# Patient Record
Sex: Female | Born: 1945 | Race: White | Hispanic: No | Marital: Single | Smoking: Former smoker
Health system: Southern US, Community
[De-identification: ages and names within clinical notes are randomized; demographics above are authoritative.]

## PROBLEM LIST (undated history)

## (undated) DIAGNOSIS — I10 Essential (primary) hypertension: Secondary | ICD-10-CM

## (undated) DIAGNOSIS — C801 Malignant (primary) neoplasm, unspecified: Secondary | ICD-10-CM

## (undated) DIAGNOSIS — R918 Other nonspecific abnormal finding of lung field: Secondary | ICD-10-CM

## (undated) DIAGNOSIS — J439 Emphysema, unspecified: Secondary | ICD-10-CM

## (undated) DIAGNOSIS — E7800 Pure hypercholesterolemia, unspecified: Secondary | ICD-10-CM

## (undated) DIAGNOSIS — N289 Disorder of kidney and ureter, unspecified: Secondary | ICD-10-CM

## (undated) HISTORY — PX: COLONOSCOPY: SHX174

## (undated) HISTORY — PX: BASAL CELL CARCINOMA EXCISION: SHX1214

## (undated) HISTORY — DX: Emphysema, unspecified: J43.9

---

## 2004-05-10 ENCOUNTER — Emergency Department: Payer: Self-pay | Admitting: Unknown Physician Specialty

## 2004-05-12 ENCOUNTER — Emergency Department: Payer: Self-pay | Admitting: Emergency Medicine

## 2006-09-11 ENCOUNTER — Ambulatory Visit: Payer: Self-pay | Admitting: Gastroenterology

## 2008-12-15 ENCOUNTER — Ambulatory Visit: Payer: Self-pay | Admitting: Nephrology

## 2011-06-14 DIAGNOSIS — E872 Acidosis: Secondary | ICD-10-CM | POA: Diagnosis not present

## 2011-06-14 DIAGNOSIS — I10 Essential (primary) hypertension: Secondary | ICD-10-CM | POA: Diagnosis not present

## 2011-10-11 DIAGNOSIS — E872 Acidosis: Secondary | ICD-10-CM | POA: Diagnosis not present

## 2011-10-11 DIAGNOSIS — I10 Essential (primary) hypertension: Secondary | ICD-10-CM | POA: Diagnosis not present

## 2011-10-11 DIAGNOSIS — R609 Edema, unspecified: Secondary | ICD-10-CM | POA: Diagnosis not present

## 2012-02-01 DIAGNOSIS — E872 Acidosis: Secondary | ICD-10-CM | POA: Diagnosis not present

## 2012-02-01 DIAGNOSIS — R609 Edema, unspecified: Secondary | ICD-10-CM | POA: Diagnosis not present

## 2012-02-01 DIAGNOSIS — I10 Essential (primary) hypertension: Secondary | ICD-10-CM | POA: Diagnosis not present

## 2012-05-23 DIAGNOSIS — R609 Edema, unspecified: Secondary | ICD-10-CM | POA: Diagnosis not present

## 2012-05-23 DIAGNOSIS — I10 Essential (primary) hypertension: Secondary | ICD-10-CM | POA: Diagnosis not present

## 2012-05-23 DIAGNOSIS — E872 Acidosis: Secondary | ICD-10-CM | POA: Diagnosis not present

## 2012-09-26 DIAGNOSIS — R609 Edema, unspecified: Secondary | ICD-10-CM | POA: Diagnosis not present

## 2012-09-26 DIAGNOSIS — E872 Acidosis: Secondary | ICD-10-CM | POA: Diagnosis not present

## 2012-09-26 DIAGNOSIS — I10 Essential (primary) hypertension: Secondary | ICD-10-CM | POA: Diagnosis not present

## 2013-01-05 ENCOUNTER — Ambulatory Visit: Payer: Self-pay | Admitting: Physician Assistant

## 2013-01-08 ENCOUNTER — Ambulatory Visit: Payer: Self-pay | Admitting: Physician Assistant

## 2013-01-09 LAB — WOUND CULTURE

## 2013-01-14 ENCOUNTER — Ambulatory Visit: Payer: Self-pay | Admitting: Physician Assistant

## 2013-01-16 DIAGNOSIS — E872 Acidosis: Secondary | ICD-10-CM | POA: Diagnosis not present

## 2013-01-16 DIAGNOSIS — I10 Essential (primary) hypertension: Secondary | ICD-10-CM | POA: Diagnosis not present

## 2013-01-16 DIAGNOSIS — R609 Edema, unspecified: Secondary | ICD-10-CM | POA: Diagnosis not present

## 2013-01-30 DIAGNOSIS — R609 Edema, unspecified: Secondary | ICD-10-CM | POA: Diagnosis not present

## 2013-01-30 DIAGNOSIS — E872 Acidosis, unspecified: Secondary | ICD-10-CM | POA: Diagnosis not present

## 2013-01-30 DIAGNOSIS — N183 Chronic kidney disease, stage 3 unspecified: Secondary | ICD-10-CM | POA: Diagnosis not present

## 2013-01-30 DIAGNOSIS — I10 Essential (primary) hypertension: Secondary | ICD-10-CM | POA: Diagnosis not present

## 2013-05-21 DIAGNOSIS — E872 Acidosis, unspecified: Secondary | ICD-10-CM | POA: Diagnosis not present

## 2013-05-21 DIAGNOSIS — I10 Essential (primary) hypertension: Secondary | ICD-10-CM | POA: Diagnosis not present

## 2013-05-21 DIAGNOSIS — N183 Chronic kidney disease, stage 3 unspecified: Secondary | ICD-10-CM | POA: Diagnosis not present

## 2013-05-21 DIAGNOSIS — R609 Edema, unspecified: Secondary | ICD-10-CM | POA: Diagnosis not present

## 2013-09-10 DIAGNOSIS — E872 Acidosis, unspecified: Secondary | ICD-10-CM | POA: Diagnosis not present

## 2013-09-10 DIAGNOSIS — I10 Essential (primary) hypertension: Secondary | ICD-10-CM | POA: Diagnosis not present

## 2013-09-10 DIAGNOSIS — N183 Chronic kidney disease, stage 3 unspecified: Secondary | ICD-10-CM | POA: Diagnosis not present

## 2013-09-10 DIAGNOSIS — R609 Edema, unspecified: Secondary | ICD-10-CM | POA: Diagnosis not present

## 2014-01-07 DIAGNOSIS — E872 Acidosis: Secondary | ICD-10-CM | POA: Diagnosis not present

## 2014-01-07 DIAGNOSIS — R600 Localized edema: Secondary | ICD-10-CM | POA: Diagnosis not present

## 2014-01-07 DIAGNOSIS — I10 Essential (primary) hypertension: Secondary | ICD-10-CM | POA: Diagnosis not present

## 2014-01-07 DIAGNOSIS — N183 Chronic kidney disease, stage 3 (moderate): Secondary | ICD-10-CM | POA: Diagnosis not present

## 2014-05-03 DIAGNOSIS — I10 Essential (primary) hypertension: Secondary | ICD-10-CM | POA: Diagnosis not present

## 2014-05-03 DIAGNOSIS — N183 Chronic kidney disease, stage 3 (moderate): Secondary | ICD-10-CM | POA: Diagnosis not present

## 2014-05-03 DIAGNOSIS — R600 Localized edema: Secondary | ICD-10-CM | POA: Diagnosis not present

## 2014-05-03 DIAGNOSIS — E872 Acidosis: Secondary | ICD-10-CM | POA: Diagnosis not present

## 2014-08-25 DIAGNOSIS — N183 Chronic kidney disease, stage 3 (moderate): Secondary | ICD-10-CM | POA: Diagnosis not present

## 2014-08-25 DIAGNOSIS — E872 Acidosis: Secondary | ICD-10-CM | POA: Diagnosis not present

## 2014-08-25 DIAGNOSIS — I10 Essential (primary) hypertension: Secondary | ICD-10-CM | POA: Diagnosis not present

## 2014-08-25 DIAGNOSIS — R600 Localized edema: Secondary | ICD-10-CM | POA: Diagnosis not present

## 2014-12-09 DIAGNOSIS — R809 Proteinuria, unspecified: Secondary | ICD-10-CM | POA: Diagnosis not present

## 2014-12-09 DIAGNOSIS — N183 Chronic kidney disease, stage 3 (moderate): Secondary | ICD-10-CM | POA: Diagnosis not present

## 2014-12-09 DIAGNOSIS — I10 Essential (primary) hypertension: Secondary | ICD-10-CM | POA: Diagnosis not present

## 2014-12-09 DIAGNOSIS — R600 Localized edema: Secondary | ICD-10-CM | POA: Diagnosis not present

## 2014-12-09 DIAGNOSIS — E872 Acidosis: Secondary | ICD-10-CM | POA: Diagnosis not present

## 2015-04-11 DIAGNOSIS — E872 Acidosis: Secondary | ICD-10-CM | POA: Diagnosis not present

## 2015-04-11 DIAGNOSIS — R600 Localized edema: Secondary | ICD-10-CM | POA: Diagnosis not present

## 2015-04-11 DIAGNOSIS — N183 Chronic kidney disease, stage 3 (moderate): Secondary | ICD-10-CM | POA: Diagnosis not present

## 2015-04-11 DIAGNOSIS — R809 Proteinuria, unspecified: Secondary | ICD-10-CM | POA: Diagnosis not present

## 2015-04-11 DIAGNOSIS — I10 Essential (primary) hypertension: Secondary | ICD-10-CM | POA: Diagnosis not present

## 2015-08-08 DIAGNOSIS — R809 Proteinuria, unspecified: Secondary | ICD-10-CM | POA: Diagnosis not present

## 2015-08-08 DIAGNOSIS — I10 Essential (primary) hypertension: Secondary | ICD-10-CM | POA: Diagnosis not present

## 2015-08-08 DIAGNOSIS — R600 Localized edema: Secondary | ICD-10-CM | POA: Diagnosis not present

## 2015-08-08 DIAGNOSIS — N183 Chronic kidney disease, stage 3 (moderate): Secondary | ICD-10-CM | POA: Diagnosis not present

## 2015-08-13 ENCOUNTER — Ambulatory Visit
Admission: EM | Admit: 2015-08-13 | Discharge: 2015-08-13 | Disposition: A | Discharge status: Home / Self Care | Payer: BLUE CROSS/BLUE SHIELD | Attending: Internal Medicine | Admitting: Internal Medicine

## 2015-08-13 ENCOUNTER — Ambulatory Visit: Payer: BLUE CROSS/BLUE SHIELD

## 2015-08-13 ENCOUNTER — Encounter: Payer: Self-pay | Admitting: Gynecology

## 2015-08-13 DIAGNOSIS — Z79899 Other long term (current) drug therapy: Secondary | ICD-10-CM | POA: Insufficient documentation

## 2015-08-13 DIAGNOSIS — E860 Dehydration: Secondary | ICD-10-CM | POA: Insufficient documentation

## 2015-08-13 DIAGNOSIS — N289 Disorder of kidney and ureter, unspecified: Secondary | ICD-10-CM | POA: Diagnosis not present

## 2015-08-13 DIAGNOSIS — Z7982 Long term (current) use of aspirin: Secondary | ICD-10-CM | POA: Insufficient documentation

## 2015-08-13 DIAGNOSIS — R05 Cough: Secondary | ICD-10-CM | POA: Diagnosis not present

## 2015-08-13 DIAGNOSIS — F17210 Nicotine dependence, cigarettes, uncomplicated: Secondary | ICD-10-CM | POA: Insufficient documentation

## 2015-08-13 DIAGNOSIS — R52 Pain, unspecified: Secondary | ICD-10-CM | POA: Diagnosis not present

## 2015-08-13 DIAGNOSIS — J209 Acute bronchitis, unspecified: Secondary | ICD-10-CM | POA: Diagnosis not present

## 2015-08-13 DIAGNOSIS — Z880 Allergy status to penicillin: Secondary | ICD-10-CM | POA: Insufficient documentation

## 2015-08-13 HISTORY — DX: Disorder of kidney and ureter, unspecified: N28.9

## 2015-08-13 HISTORY — DX: Pure hypercholesterolemia, unspecified: E78.00

## 2015-08-13 HISTORY — DX: Essential (primary) hypertension: I10

## 2015-08-13 LAB — COMPREHENSIVE METABOLIC PANEL
ALK PHOS: 91 U/L (ref 38–126)
ALT: 14 U/L (ref 14–54)
ANION GAP: 8 (ref 5–15)
AST: 17 U/L (ref 15–41)
Albumin: 3.2 g/dL — ABNORMAL LOW (ref 3.5–5.0)
BILIRUBIN TOTAL: 1.6 mg/dL — AB (ref 0.3–1.2)
BUN: 29 mg/dL — ABNORMAL HIGH (ref 6–20)
CALCIUM: 8.8 mg/dL — AB (ref 8.9–10.3)
CO2: 23 mmol/L (ref 22–32)
Chloride: 101 mmol/L (ref 101–111)
Creatinine, Ser: 1.68 mg/dL — ABNORMAL HIGH (ref 0.44–1.00)
GFR, EST AFRICAN AMERICAN: 34 mL/min — AB (ref >60)
GFR, EST NON AFRICAN AMERICAN: 30 mL/min — AB (ref >60)
Glucose, Bld: 116 mg/dL — ABNORMAL HIGH (ref 65–99)
POTASSIUM: 3.7 mmol/L (ref 3.5–5.1)
Sodium: 132 mmol/L — ABNORMAL LOW (ref 135–145)
TOTAL PROTEIN: 7.5 g/dL (ref 6.5–8.1)

## 2015-08-13 LAB — CBC WITH DIFFERENTIAL/PLATELET
BASOS ABS: 0 10*3/uL (ref 0–0.1)
BASOS PCT: 0 %
Eosinophils Absolute: 0 10*3/uL (ref 0–0.7)
Eosinophils Relative: 0 %
HEMATOCRIT: 38.9 % (ref 35.0–47.0)
Hemoglobin: 13.1 g/dL (ref 12.0–16.0)
LYMPHS PCT: 3 %
Lymphs Abs: 0.5 10*3/uL — ABNORMAL LOW (ref 1.0–3.6)
MCH: 30.7 pg (ref 26.0–34.0)
MCHC: 33.6 g/dL (ref 32.0–36.0)
MCV: 91.2 fL (ref 80.0–100.0)
Monocytes Absolute: 1.1 10*3/uL — ABNORMAL HIGH (ref 0.2–0.9)
Monocytes Relative: 6 %
NEUTROS ABS: 15.9 10*3/uL — AB (ref 1.4–6.5)
Neutrophils Relative %: 91 %
PLATELETS: 381 10*3/uL (ref 150–440)
RBC: 4.26 MIL/uL (ref 3.80–5.20)
RDW: 13.6 % (ref 11.5–14.5)
WBC: 17.6 10*3/uL — AB (ref 3.6–11.0)

## 2015-08-13 LAB — URINALYSIS COMPLETE WITH MICROSCOPIC (ARMC ONLY)
GLUCOSE, UA: NEGATIVE mg/dL
LEUKOCYTES UA: NEGATIVE
NITRITE: POSITIVE — AB
Protein, ur: >300 mg/dL — AB
SPECIFIC GRAVITY, URINE: 1.025 (ref 1.005–1.030)
SQUAMOUS EPITHELIAL / LPF: NONE SEEN
pH: 5 (ref 5.0–8.0)

## 2015-08-13 MED ORDER — PREDNISONE 50 MG PO TABS: 50.0000 mg | ORAL_TABLET | Freq: Every day | ORAL | Status: AC

## 2015-08-13 MED ORDER — LEVOFLOXACIN 500 MG PO TABS
500.0000 mg | ORAL_TABLET | Freq: Once | ORAL | Status: AC
  Administered 2015-08-13: 500 mg via ORAL

## 2015-08-13 MED ORDER — LEVOFLOXACIN 250 MG PO TABS: 250.0000 mg | ORAL_TABLET | Freq: Every day | ORAL | Status: DC

## 2015-08-13 MED ORDER — IPRATROPIUM-ALBUTEROL 0.5-2.5 (3) MG/3ML IN SOLN
3.0000 mL | Freq: Once | RESPIRATORY_TRACT | Status: AC
  Administered 2015-08-13: 3 mL via RESPIRATORY_TRACT

## 2015-08-13 MED ORDER — SODIUM CHLORIDE 0.9 % IV BOLUS (SEPSIS)
1000.0000 mL | Freq: Once | INTRAVENOUS | Status: AC
  Administered 2015-08-13: 1000 mL via INTRAVENOUS

## 2015-08-13 NOTE — ED Notes (Signed)
Patient c/o coughing/ low energy / and body ache x4 days.

## 2015-08-13 NOTE — ED Provider Notes (Signed)
CSN: [id number]     Arrival date & time 08/13/15  1053 History   First MD Initiated Contact with Patient 08/13/15 1122     Chief Complaint  Patient presents with  . Generalized Body Aches  . Cough   HPI  Patient is a 70 year old lady who presents today with 3 days history of achiness, chills, tactile temperature. Malaise. Just has no energy. Has a little bit of dry cough, little bit of runny/congested nose.  Some nausea, no vomiting. No diarrhea. No abdominal pain. No urinary frequency, no dysuria. Not lightheaded. No rash.  Works in the yard on weekends, mostly weed eating and rides the mower.  Has not seen a tick this year, no known tick bite. Has a dog who is mostly indoors. Follows with Dr. Holley Raring for renal insufficiency, has been told that she is not likely to need dialysis.  Is on multiple blood pressure medications. No recent changes or additions to her medication regimen.  Past Medical History  Diagnosis Date  . Hypertension   . Hypercholesteremia   . Renal disorder    History reviewed. No pertinent past surgical history. No family history on file. Social History  Substance Use Topics  . Smoking status: Current Every Day Smoker -- 1.00 packs/day    Types: Cigarettes  . Smokeless tobacco: None  . Alcohol Use: Yes    Review of Systems  Allergies  Augmentin and Penicillins  Home Medications   Prior to Admission medications   Medication Sig Start Date End Date Taking? Authorizing Provider  amLODipine (NORVASC) 10 MG tablet Take 10 mg by mouth daily.   Yes Historical Provider, MD  aspirin 81 MG tablet Take 81 mg by mouth daily.   Yes Historical Provider, MD  atorvastatin (LIPITOR) 20 MG tablet Take 20 mg by mouth daily.   Yes Historical Provider, MD  hydrALAZINE (APRESOLINE) 50 MG tablet Take 50 mg by mouth 3 (three) times daily.   Yes Historical Provider, MD  lisinopril (PRINIVIL,ZESTRIL) 40 MG tablet Take 40 mg by mouth daily.   Yes Historical Provider, MD   metoprolol succinate (TOPROL-XL) 100 MG 24 hr tablet Take 100 mg by mouth daily. Take with or immediately following a meal.   Yes Historical Provider, MD  sodium bicarbonate 650 MG tablet Take 650 mg by mouth 4 (four) times daily.   Yes Historical Provider, MD  levofloxacin (LEVAQUIN) 250 MG tablet Take 1 tablet (250 mg total) by mouth daily. 08/13/15   Sherlene Shams, MD  predniSONE (DELTASONE) 50 MG tablet Take 1 tablet (50 mg total) by mouth daily. 08/13/15 08/14/15  Sherlene Shams, MD   Meds Ordered and Administered this Visit   Medications  ipratropium-albuterol (DUONEB) 0.5-2.5 (3) MG/3ML nebulizer solution 3 mL (3 mLs Nebulization Given 08/13/15 1140)  levofloxacin (LEVAQUIN) tablet 500 mg (500 mg Oral Given 08/13/15 1311)  sodium chloride 0.9 % bolus 1,000 mL (1,000 mLs Intravenous Given 08/13/15 1311)    BP 93/58 mmHg  Pulse 105  Temp(Src) 98 F (36.7 C)  Resp 16  Ht '5\' 3"'$  (1.6 m)  Wt 154 lb (69.854 kg)  BMI 27.29 kg/m2  SpO2 96% Orthostatic VS for the past 24 hrs:  BP- Lying Pulse- Lying BP- Sitting Pulse- Sitting BP- Standing at 0 minutes Pulse- Standing at 0 minutes  08/13/15 1136 95/63 mmHg 97 (!) 87/59 mmHg 103 92/54 mmHg 105   Physical Exam  Constitutional: She is oriented to person, place, and time. No distress.  Alert, nicely groomed Voice  sounds very congested. Sitting in the bedside chair.  HENT:  Head: Atraumatic.  Bilateral TMs with marked dullness, no erythema Marked left nasal congestion, moderate right Throat is quite red with postnasal drainage evident  Eyes:  Conjugate gaze, no eye redness/drainage  Neck: Neck supple.  Cardiovascular: Normal rate and regular rhythm.   Heart rate 100s on exam Heart tones best appreciated in the subxiphoid area  Pulmonary/Chest: No respiratory distress.  Coarse but symmetric breath sounds Prolonged expiration Breath sounds diminished posteriorly Odor of smoke  Abdominal: She exhibits no distension.  Musculoskeletal:  Normal range of motion.  No leg swelling  Neurological: She is alert and oriented to person, place, and time.  Skin: Skin is warm and dry.  No cyanosis Pink/tanned.  Nursing note and vitals reviewed.   ED Course  Procedures (including critical care time)  Labs Review  Results for orders placed or performed during the hospital encounter of 08/13/15  Comprehensive metabolic panel  Result Value Ref Range   Sodium 132 (L) 135 - 145 mmol/L   Potassium 3.7 3.5 - 5.1 mmol/L   Chloride 101 101 - 111 mmol/L   CO2 23 22 - 32 mmol/L   Glucose, Bld 116 (H) 65 - 99 mg/dL   BUN 29 (H) 6 - 20 mg/dL   Creatinine, Ser 1.68 (H) 0.44 - 1.00 mg/dL   Calcium 8.8 (L) 8.9 - 10.3 mg/dL   Total Protein 7.5 6.5 - 8.1 g/dL   Albumin 3.2 (L) 3.5 - 5.0 g/dL   AST 17 15 - 41 U/L   ALT 14 14 - 54 U/L   Alkaline Phosphatase 91 38 - 126 U/L   Total Bilirubin 1.6 (H) 0.3 - 1.2 mg/dL   GFR calc non Af Amer 30 (L) >60 mL/min   GFR calc Af Amer 34 (L) >60 mL/min   Anion gap 8 5 - 15  CBC with Differential  Result Value Ref Range   WBC 17.6 (H) 3.6 - 11.0 K/uL   RBC 4.26 3.80 - 5.20 MIL/uL   Hemoglobin 13.1 12.0 - 16.0 g/dL   HCT 38.9 35.0 - 47.0 %   MCV 91.2 80.0 - 100.0 fL   MCH 30.7 26.0 - 34.0 pg   MCHC 33.6 32.0 - 36.0 g/dL   RDW 13.6 11.5 - 14.5 %   Platelets 381 150 - 440 K/uL   Neutrophils Relative % 91 %   Neutro Abs 15.9 (H) 1.4 - 6.5 K/uL   Lymphocytes Relative 3 %   Lymphs Abs 0.5 (L) 1.0 - 3.6 K/uL   Monocytes Relative 6 %   Monocytes Absolute 1.1 (H) 0.2 - 0.9 K/uL   Eosinophils Relative 0 %   Eosinophils Absolute 0.0 0 - 0.7 K/uL   Basophils Relative 0 %   Basophils Absolute 0.0 0 - 0.1 K/uL  Urinalysis complete, with microscopic  Result Value Ref Range   Color, Urine AMBER (A) YELLOW   APPearance CLOUDY (A) CLEAR   Glucose, UA NEGATIVE NEGATIVE mg/dL   Bilirubin Urine LARGE (A) NEGATIVE   Ketones, ur TRACE (A) NEGATIVE mg/dL   Specific Gravity, Urine 1.025 1.005 - 1.030   Hgb  urine dipstick TRACE (A) NEGATIVE   pH 5.0 5.0 - 8.0   Protein, ur >300 (A) NEGATIVE mg/dL   Nitrite POSITIVE (A) NEGATIVE   Leukocytes, UA NEGATIVE NEGATIVE   RBC / HPF 0-5 0 - 5 RBC/hpf   WBC, UA 0-5 0 - 5 WBC/hpf   Bacteria, UA MANY (A) NONE  SEEN   Squamous Epithelial / LPF NONE SEEN NONE SEEN   Hyaline Casts, UA PRESENT    Granular Casts, UA PRESENT      Imaging Review Dg Chest 2 View  08/13/2015  CLINICAL DATA:  Cough. EXAM: CHEST  2 VIEW COMPARISON:  None. FINDINGS: The heart size and mediastinal contours are within normal limits. No pneumothorax or significant pleural effusion is noted. Left lung is clear. Atelectasis of the right lower lobe is noted. The visualized skeletal structures are unremarkable. IMPRESSION: Right lower lobe atelectasis is noted concerning for central endobronchial obstruction. Further evaluation with CT scan or bronchoscopy is recommended. These results will be called to the ordering clinician or representative by the Radiologist Assistant, and communication documented in the PACS or zVision Dashboard. Electronically Signed   By: Marijo Conception, M.D.   On: 08/13/2015 12:49    MDM   1. Acute bronchitis, unspecified organism   2. Moderate dehydration   3. Acute renal insufficiency    Evaluation today suggested an underlying lung infection causing fatigue, chills, and cough, with mild to moderate dehydration contributing to low blood pressure and fatigue. A liter of iv fluid was given at the urgent care, and a dose of levaquin (antibiotic). Prescriptions for levaquin (antibiotic) and prednisone (steroid, for cough/congestion) were sent to [facility] in [place].  Neither of these medicines are related to penicillin/augmentin. Please push fluids, to avoid further dehydration. Chest xray was abnormal at the urgent care and will need to be rechecked, possibly with further evaluation with a chest CT.  Please see Dr Holley Raring in the next few weeks to discuss  this.  Meds ordered this encounter  Medications  . levofloxacin (LEVAQUIN) 250 MG tablet    Sig: Take 1 tablet (250 mg total) by mouth daily.    Dispense:  5 tablet    Refill:  0  . predniSONE (DELTASONE) 50 MG tablet    Sig: Take 1 tablet (50 mg total) by mouth daily.    Dispense:  2 tablet    Refill:  0      Sherlene Shams, MD 08/13/15 1630

## 2015-08-13 NOTE — Discharge Instructions (Signed)
Evaluation today suggested an underlying lung infection causing fatigue, chills, and cough, with mild to moderate dehydration contributing to low blood pressure and fatigue. A liter of iv fluid was given at the urgent care, and a dose of levaquin (antibiotic). Prescriptions for levaquin (antibiotic) and prednisone (steroid, for cough/congestion) [facility] [place].  Neither of these medicines are related to penicillin/augmentin. Please push fluids, to avoid further dehydration. Chest xray was abnormal at the urgent care and will need to be rechecked, possibly with further evaluation with a chest CT.  Please see Dr Holley Raring in the next few weeks to discuss this.

## 2015-09-01 DIAGNOSIS — I10 Essential (primary) hypertension: Secondary | ICD-10-CM | POA: Diagnosis not present

## 2015-09-01 DIAGNOSIS — N183 Chronic kidney disease, stage 3 (moderate): Secondary | ICD-10-CM | POA: Diagnosis not present

## 2015-09-01 DIAGNOSIS — E872 Acidosis: Secondary | ICD-10-CM | POA: Diagnosis not present

## 2015-09-01 DIAGNOSIS — R600 Localized edema: Secondary | ICD-10-CM | POA: Diagnosis not present

## 2015-09-02 ENCOUNTER — Other Ambulatory Visit: Payer: Self-pay | Admitting: Nephrology

## 2015-09-02 DIAGNOSIS — J9811 Atelectasis: Secondary | ICD-10-CM

## 2015-09-06 ENCOUNTER — Ambulatory Visit
Admission: RE | Admit: 2015-09-06 | Discharge: 2015-09-06 | Disposition: A | Discharge status: Home / Self Care | Payer: BLUE CROSS/BLUE SHIELD | Source: Ambulatory Visit | Attending: Nephrology | Admitting: Nephrology

## 2015-09-06 DIAGNOSIS — J9811 Atelectasis: Secondary | ICD-10-CM

## 2015-09-06 DIAGNOSIS — R918 Other nonspecific abnormal finding of lung field: Secondary | ICD-10-CM | POA: Diagnosis not present

## 2015-09-08 ENCOUNTER — Telehealth: Payer: Self-pay | Admitting: *Deleted

## 2015-09-08 NOTE — Telephone Encounter (Signed)
Spoke with pt to inform her of the appt for 09/13/15. Pt refused that appt stating she would be out of town. Informed pt Dr. Zollie Scale really wanted her to come in next week. R/S appt for following week on 09/19/15. Nothing further needed.

## 2015-09-08 NOTE — Telephone Encounter (Signed)
LMOM for pt to return call in regards to an appt.

## 2015-09-13 ENCOUNTER — Institutional Professional Consult (permissible substitution): Payer: BLUE CROSS/BLUE SHIELD | Admitting: Internal Medicine

## 2015-09-19 ENCOUNTER — Ambulatory Visit
Admission: RE | Admit: 2015-09-19 | Discharge: 2015-09-19 | Disposition: A | Discharge status: Home / Self Care | Payer: BLUE CROSS/BLUE SHIELD | Source: Ambulatory Visit | Attending: Pulmonary Disease | Admitting: Pulmonary Disease

## 2015-09-19 ENCOUNTER — Ambulatory Visit (INDEPENDENT_AMBULATORY_CARE_PROVIDER_SITE_OTHER): Payer: BLUE CROSS/BLUE SHIELD | Admitting: Pulmonary Disease

## 2015-09-19 ENCOUNTER — Encounter: Payer: Self-pay | Admitting: Pulmonary Disease

## 2015-09-19 VITALS — BP 148/86 | HR 118 | Ht 63.0 in | Wt 147.8 lb

## 2015-09-19 DIAGNOSIS — R918 Other nonspecific abnormal finding of lung field: Secondary | ICD-10-CM | POA: Diagnosis not present

## 2015-09-19 DIAGNOSIS — R05 Cough: Secondary | ICD-10-CM | POA: Diagnosis not present

## 2015-09-19 DIAGNOSIS — R938 Abnormal findings on diagnostic imaging of other specified body structures: Secondary | ICD-10-CM

## 2015-09-19 DIAGNOSIS — J439 Emphysema, unspecified: Secondary | ICD-10-CM | POA: Diagnosis not present

## 2015-09-19 DIAGNOSIS — Z720 Tobacco use: Secondary | ICD-10-CM

## 2015-09-19 DIAGNOSIS — R0989 Other specified symptoms and signs involving the circulatory and respiratory systems: Secondary | ICD-10-CM | POA: Diagnosis not present

## 2015-09-19 DIAGNOSIS — R9389 Abnormal findings on diagnostic imaging of other specified body structures: Secondary | ICD-10-CM

## 2015-09-19 DIAGNOSIS — F17200 Nicotine dependence, unspecified, uncomplicated: Secondary | ICD-10-CM

## 2015-09-19 NOTE — Patient Instructions (Signed)
Plan bronchoscopy on 09/22/15 I encouraged smoking cessation Follow up on 10/06/15 to discuss results and smoking and emphysema

## 2015-09-19 NOTE — Progress Notes (Signed)
PULMONARY CONSULT NOTE  Requesting MD/Service: Holley Raring Date of initial consultation: 09/19/15 Reason for consultation: abnormal CT chest  PT PROFILE: 30 F smoker referred for evaluation of RLL volume loss and infiltrate   HPI:  26 F who initially presented to [facility] 5 wks ago with cough productive of "phlegm" and low grade fever. A CXR revealed RLL volume loss and AS dz. She was treated with abx and a CT chest was performed which is reviewed below. She is referred for further evaluation of the same. She indicates that she is "much better". She denies dyspnea, CP, fever, purulent sputum, hemoptysis, LE edema and calf tenderness. She has CKD and this CT chest was reviewed by Dr Holley Raring with the patient and this referral made   Past Medical History:  Diagnosis Date  . Hypercholesteremia   . Hypertension   . Renal disorder     No past surgical history on file.  MEDICATIONS: I have reviewed all medications and confirmed regimen as documented  Social History   Social History  . Marital status: Single    Spouse name: N/A  . Number of children: N/A  . Years of education: N/A   Occupational History  . Not on file.   Social History Main Topics  . Smoking status: Current Every Day Smoker    Packs/day: 1.00    Years: 45.00    Types: Cigarettes  . Smokeless tobacco: Never Used  . Alcohol use Yes     Comment: occ  . Drug use: No  . Sexual activity: Not on file   Other Topics Concern  . Not on file   Social History Narrative  . No narrative on file    Family History  Problem Relation Age of Onset  . Alzheimer's disease Mother   . Heart disease Father     ROS: No fever presently, myalgias/arthralgias, unexplained weight loss or weight gain No new focal weakness or sensory deficits No otalgia, hearing loss, visual changes, nasal and sinus symptoms, mouth and throat problems No neck pain or adenopathy No abdominal pain, N/V/D, diarrhea, change in bowel  pattern No dysuria, change in urinary pattern   Vitals:   09/19/15 1322  BP: (!) 148/86  Pulse: (!) 118  SpO2: 95%  Weight: 147 lb 12.8 oz (67 kg)  Height: '5\' 3"'$  (1.6 m)     EXAM:  Gen: WDWN, No overt respiratory distress HEENT: NCAT, sclera white, oropharynx normal Neck: Supple without LAN, thyromegaly, JVD Lungs: breath sounds: diminished in R base, percussion: dull in R base, pleural friction rub in R base Cardiovascular: RRR, no murmurs noted Abdomen: Soft, nontender, normal BS Ext: without clubbing, cyanosis, edema Neuro: CNs grossly intact, motor and sensory intact Skin: Limited exam, no lesions noted  DATA:   BMP Latest Ref Rng & Units 08/13/2015  Glucose 65 - 99 mg/dL 116(H)  BUN 6 - 20 mg/dL 29(H)  Creatinine 0.44 - 1.00 mg/dL 1.68(H)  Sodium 135 - 145 mmol/L 132(L)  Potassium 3.5 - 5.1 mmol/L 3.7  Chloride 101 - 111 mmol/L 101  CO2 22 - 32 mmol/L 23  Calcium 8.9 - 10.3 mg/dL 8.8(L)    CBC Latest Ref Rng & Units 08/13/2015  WBC 3.6 - 11.0 K/uL 17.6(H)  Hemoglobin 12.0 - 16.0 g/dL 13.1  Hematocrit 35.0 - 47.0 % 38.9  Platelets 150 - 440 K/uL 381    CXR (09/19/15):  RLL volume loss and consolidation, slightly improved aeration compared to previous film  CT chest (09/06/15): Persistent  right lower lobe consolidation with evidence of right middle lobe nodules and somewhat spiculated density at the lung base. This constellation of findings is highly suspicious for underlying neoplasm. Bronchoscopic evaluation may be helpful  IMPRESSION:     ICD-9-CM ICD-10-CM   1. Smoker 305.1 Z72.0   2. Pulmonary infiltrate 793.19 R91.8 DG Chest 2 View  3. Abnormal CT of the chest 793.2 R93.8   4. Pulmonary emphysema, unspecified emphysema type ([place]) 492.8 J43.9   5. Prominent R pleural friction rub  This might represent simply resolving PNA but, given her smoking history, I am concerned about possible endobronchial obstruction and post obstructive changes. I have  discussed this concern with her   PLAN:  1) we discussed smoking cessation 2) bronchoscopy scheduled for 09/22/15 - if there is endobronchial lesion, I will biopsy. If not, brush and lavage RLL 3) ROV 10/06/15. I will call her with results after they are available to me.   Merton Border, MD PCCM service Mobile [phone] Pager [phone] 09/21/2015

## 2015-09-21 ENCOUNTER — Encounter: Payer: Self-pay | Admitting: Pulmonary Disease

## 2015-09-22 ENCOUNTER — Encounter
Admission: RE | Disposition: A | Discharge status: Home / Self Care | Payer: Self-pay | Source: Ambulatory Visit | Attending: Pulmonary Disease

## 2015-09-22 ENCOUNTER — Ambulatory Visit
Admission: RE | Admit: 2015-09-22 | Discharge: 2015-09-22 | Disposition: A | Discharge status: Home / Self Care | Payer: BLUE CROSS/BLUE SHIELD | Source: Ambulatory Visit | Attending: Pulmonary Disease | Admitting: Pulmonary Disease

## 2015-09-22 ENCOUNTER — Encounter: Payer: Self-pay | Admitting: *Deleted

## 2015-09-22 DIAGNOSIS — J439 Emphysema, unspecified: Secondary | ICD-10-CM | POA: Diagnosis not present

## 2015-09-22 DIAGNOSIS — Z8249 Family history of ischemic heart disease and other diseases of the circulatory system: Secondary | ICD-10-CM | POA: Insufficient documentation

## 2015-09-22 DIAGNOSIS — I10 Essential (primary) hypertension: Secondary | ICD-10-CM | POA: Insufficient documentation

## 2015-09-22 DIAGNOSIS — J9819 Other pulmonary collapse: Secondary | ICD-10-CM | POA: Diagnosis not present

## 2015-09-22 DIAGNOSIS — F17210 Nicotine dependence, cigarettes, uncomplicated: Secondary | ICD-10-CM | POA: Insufficient documentation

## 2015-09-22 DIAGNOSIS — R05 Cough: Secondary | ICD-10-CM | POA: Diagnosis not present

## 2015-09-22 DIAGNOSIS — J984 Other disorders of lung: Secondary | ICD-10-CM | POA: Diagnosis not present

## 2015-09-22 DIAGNOSIS — Z820 Family history of epilepsy and other diseases of the nervous system: Secondary | ICD-10-CM | POA: Insufficient documentation

## 2015-09-22 HISTORY — PX: FLEXIBLE BRONCHOSCOPY: SHX5094

## 2015-09-22 SURGERY — BRONCHOSCOPY, FLEXIBLE
Anesthesia: Moderate Sedation

## 2015-09-22 MED ORDER — MIDAZOLAM HCL 5 MG/5ML IJ SOLN
INTRAMUSCULAR | Status: AC
  Filled 2015-09-22: qty 10

## 2015-09-22 MED ORDER — BUTAMBEN-TETRACAINE-BENZOCAINE 2-2-14 % EX AERO
1.0000 | INHALATION_SPRAY | Freq: Once | CUTANEOUS | Status: DC
  Filled 2015-09-22: qty 20

## 2015-09-22 MED ORDER — FENTANYL CITRATE (PF) 100 MCG/2ML IJ SOLN
INTRAMUSCULAR | Status: AC | PRN
  Administered 2015-09-22 (×2): 25 ug via INTRAVENOUS

## 2015-09-22 MED ORDER — FENTANYL CITRATE (PF) 100 MCG/2ML IJ SOLN
INTRAMUSCULAR | Status: AC
  Filled 2015-09-22: qty 4

## 2015-09-22 MED ORDER — PHENYLEPHRINE HCL 0.25 % NA SOLN
2.0000 | Freq: Once | NASAL | Status: DC
  Filled 2015-09-22: qty 15

## 2015-09-22 MED ORDER — MIDAZOLAM HCL 5 MG/5ML IJ SOLN
INTRAMUSCULAR | Status: AC | PRN
  Administered 2015-09-22: 2 mg via INTRAVENOUS

## 2015-09-22 MED ORDER — LIDOCAINE HCL 2 % EX GEL: 1.0000 "application " | Freq: Once | CUTANEOUS | Status: DC

## 2015-09-22 NOTE — H&P (Signed)
HPI:  58 F who initially presented to [facility] 5 wks ago with cough productive of "phlegm" and low grade fever. A CXR revealed RLL volume loss and AS dz. She was treated with abx and a CT chest was performed which is reviewed below. She is referred for further evaluation of the same. She indicates that she is "much better". She denies dyspnea, CP, fever, purulent sputum, hemoptysis, LE edema and calf tenderness. She has CKD and this CT chest was reviewed by Dr Holley Raring with the patient and this referral made       Past Medical History:  Diagnosis Date  . Hypercholesteremia   . Hypertension   . Renal disorder     No past surgical history on file.  MEDICATIONS: I have reviewed all medications and confirmed regimen as documented  Social History        Social History  . Marital status: Single    Spouse name: N/A  . Number of children: N/A  . Years of education: N/A      Occupational History  . Not on file.         Social History Main Topics  . Smoking status: Current Every Day Smoker    Packs/day: 1.00    Years: 45.00    Types: Cigarettes  . Smokeless tobacco: Never Used  . Alcohol use Yes     Comment: occ  . Drug use: No  . Sexual activity: Not on file       Other Topics Concern  . Not on file      Social History Narrative  . No narrative on file         Family History  Problem Relation Age of Onset  . Alzheimer's disease Mother   . Heart disease Father     ROS: No fever presently, myalgias/arthralgias, unexplained weight loss or weight gain No new focal weakness or sensory deficits No otalgia, hearing loss, visual changes, nasal and sinus symptoms, mouth and throat problems No neck pain or adenopathy No abdominal pain, N/V/D, diarrhea, change in bowel pattern No dysuria, change in urinary pattern      Vitals:   09/19/15 1322  BP: (!) 148/86  Pulse: (!) 118  SpO2: 95%  Weight: 147 lb 12.8 oz (67 kg)   Height: '5\' 3"'$  (1.6 m)     EXAM:  Gen: WDWN, No overt respiratory distress HEENT: NCAT, sclera white, oropharynx normal Neck: Supple without LAN, thyromegaly, JVD Lungs: breath sounds: diminished in R base, percussion: dull in R base, pleural friction rub in R base Cardiovascular: RRR, no murmurs noted Abdomen: Soft, nontender, normal BS Ext: without clubbing, cyanosis, edema Neuro: CNs grossly intact, motor and sensory intact Skin: Limited exam, no lesions noted  DATA:   BMP Latest Ref Rng & Units 08/13/2015  Glucose 65 - 99 mg/dL 116(H)  BUN 6 - 20 mg/dL 29(H)  Creatinine 0.44 - 1.00 mg/dL 1.68(H)  Sodium 135 - 145 mmol/L 132(L)  Potassium 3.5 - 5.1 mmol/L 3.7  Chloride 101 - 111 mmol/L 101  CO2 22 - 32 mmol/L 23  Calcium 8.9 - 10.3 mg/dL 8.8(L)    CBC Latest Ref Rng & Units 08/13/2015  WBC 3.6 - 11.0 K/uL 17.6(H)  Hemoglobin 12.0 - 16.0 g/dL 13.1  Hematocrit 35.0 - 47.0 % 38.9  Platelets 150 - 440 K/uL 381    CXR (09/19/15):  RLL volume loss and consolidation, slightly improved aeration compared to previous film  CT chest (09/06/15): Persistent right lower  lobe consolidation with evidence of right middle lobe nodules and somewhat spiculated density at the lung base. This constellation of findings is highly suspicious for underlying neoplasm. Bronchoscopic evaluation may be helpful  IMPRESSION:     ICD-9-CM ICD-10-CM   1. Smoker 305.1 Z72.0   2. Pulmonary infiltrate 793.19 R91.8 DG Chest 2 View  3. Abnormal CT of the chest 793.2 R93.8   4. Pulmonary emphysema, unspecified emphysema type ([place]) 492.8 J43.9   5. Prominent R pleural friction rub  This might represent simply resolving PNA but, given her smoking history, I am concerned about possible endobronchial obstruction and post obstructive changes. I have discussed this concern with her   PLAN:  1) we discussed smoking cessation 2) bronchoscopy scheduled for today - if there is endobronchial  lesion, I will biopsy. If not, brush and lavage RLL 3) ROV 10/06/15. I will call her with results after they are available to me.   Merton Border, MD PCCM service Mobile [phone] Pager [phone] 09/21/2015

## 2015-09-22 NOTE — Procedures (Signed)
Indication:   RLL infiltrate and volume loss  Premedication:   Fentanyl 50 mcg Midaz 2 mg  Anesthesia: Topical to nose and throat 30 cc of 1% lidocaine used during the course of procedure  Procedure: After adequate sedation and anesthesia, the bronchoscope was introduced via the R naris and advanced into the posterior pharynx. Further anesthesia was obtained with 1% lidocaine and the scope was advanced into the trachea. Complete airway anesthesia was achieved with 1% lidocaine and a thorough airway examination was performed. This revealed the following findings:  Findings:  Upper airway - norma anatomy, cords moved symmetrically Tracheobronchial anatomy - normal Bronchial mucosa - mild chronic bronchitic changes Other - no endobronchial tumors, masses or foreign bodies  Specimens:   BAL from RLL Cytology brushings from RLL  Sent for bacterial cultures and cytology  Complications: none  Post procedure evaluation:  The patient tolerated the procedure well with no major complications   Merton Border, MD PCCM service Mobile [phone] Pager [phone] 05/20/2015

## 2015-09-22 NOTE — Discharge Instructions (Signed)
bronchoscopy

## 2015-09-23 ENCOUNTER — Encounter: Payer: Self-pay | Admitting: Pulmonary Disease

## 2015-09-25 LAB — CULTURE, BAL-QUANTITATIVE

## 2015-09-25 LAB — CULTURE, BAL-QUANTITATIVE W GRAM STAIN
Culture: 20000 — AB
Culture: NO GROWTH

## 2015-09-26 LAB — CYTOLOGY - NON PAP

## 2015-10-06 ENCOUNTER — Ambulatory Visit (INDEPENDENT_AMBULATORY_CARE_PROVIDER_SITE_OTHER): Payer: BLUE CROSS/BLUE SHIELD | Admitting: Pulmonary Disease

## 2015-10-06 ENCOUNTER — Ambulatory Visit
Admission: RE | Admit: 2015-10-06 | Discharge: 2015-10-06 | Disposition: A | Discharge status: Home / Self Care | Payer: BLUE CROSS/BLUE SHIELD | Source: Ambulatory Visit | Attending: Pulmonary Disease | Admitting: Pulmonary Disease

## 2015-10-06 ENCOUNTER — Encounter: Payer: Self-pay | Admitting: Pulmonary Disease

## 2015-10-06 VITALS — BP 136/80 | HR 86 | Ht 63.0 in | Wt 153.0 lb

## 2015-10-06 DIAGNOSIS — R918 Other nonspecific abnormal finding of lung field: Secondary | ICD-10-CM | POA: Diagnosis not present

## 2015-10-06 DIAGNOSIS — J181 Lobar pneumonia, unspecified organism: Secondary | ICD-10-CM | POA: Diagnosis not present

## 2015-10-06 DIAGNOSIS — J9811 Atelectasis: Secondary | ICD-10-CM | POA: Diagnosis not present

## 2015-10-06 DIAGNOSIS — Z720 Tobacco use: Secondary | ICD-10-CM

## 2015-10-06 DIAGNOSIS — F17200 Nicotine dependence, unspecified, uncomplicated: Secondary | ICD-10-CM

## 2015-10-06 NOTE — Patient Instructions (Signed)
Chest Xray today I will call you with the chest Xray findings Follow up in six weeks - I will decide on further imaging of your chest based on the findings of today's Xray  STOP SMOKING!!

## 2015-10-07 ENCOUNTER — Telehealth: Payer: Self-pay | Admitting: *Deleted

## 2015-10-07 NOTE — Telephone Encounter (Signed)
Pt informed and nothing further needed.

## 2015-10-07 NOTE — Telephone Encounter (Signed)
-----   Message from [name], MD sent at 10/07/2015  1:01 PM EDT ----- Please let her know that there is no change in her CXR - no better, no worse. I have placed an order for CT chest to be performed in 6 weeks, prior to her follow up appointment  Christina Sharp

## 2015-10-08 NOTE — Progress Notes (Signed)
PULMONARY OFFICE FOLLOW UP NOTE  Requesting MD/Service: Holley Raring Date of initial consultation: 09/19/15 Reason for consultation: abnormal CT chest PT PROFILE: 69 F smoker referred for evaluation of RLL volume loss and infiltrate INITIAL HPI:  22 F who initially presented to [facility] 5 wks ago with cough productive of "phlegm" and low grade fever. A CXR revealed RLL volume loss and AS dz. She was treated with abx and a CT chest was performed which is reviewed below. She is referred for further evaluation of the same. She indicates that she is "much better". She denies dyspnea, CP, fever, purulent sputum, hemoptysis, LE edema and calf tenderness. She has CKD and this CT chest was reviewed by Dr Holley Raring with the patient and this referral made INITIAL IMPRESSION: 1) recent PNA,, 2) persistent RLL infiltrate and atelectasis, 3) pulmonary nodule, 4) smoker, 5) prominent pleural friction rub (asymptomatic) INITIAL PLAN:  1) discussed smoking cessation 2) bronchoscopy scheduled for 09/22/15 3) ROV 10/06/15. I will call her with results after they are available to me.  FOB 09/22/15:  Mild to moderate chronic bronchitis changes. No endobronchial lesions. Brushings/washings from RLL positive for inflammatory cells, no malignancy  ROV 10/06/15: remains asymptomatic. Exam with persistent pleural friction rub. CXR with persistent RLL consolidation and atelectasis. PLAN: repeat CT chest in 6 weeks with ROV to follow  SUBJ: No new complaints. Denies CP, fever, purulent sputum, hemoptysis, LE edema and calf tenderness. Has worked on smoking cessation and has cut down to 2-3 cigarettes per day. Here to discuss results of bronchoscopy and what to do next  Vitals:   10/06/15 1449  BP: 136/80  Pulse: 86  SpO2: 94%  Weight: 153 lb (69.4 kg)  Height: '5\' 3"'$  (1.6 m)     EXAM:  Gen: WDWN, NAD HEENT: WNL Neck: Supple without LAN Lungs: BS diminished in R base, dull in R base, pleural friction rub in  R base, no wheezes Cardiovascular: RRR, no murmurs noted Abdomen: Soft, nontender, normal BS Ext: without clubbing, cyanosis, edema Neuro: CNs grossly intact  DATA:  CXR 10/06/15: [place] RLL consolidation and atelectasis  IMPRESSION:   1) Smoker 2) Recent PNA 3) persistent RLL consolidation and atelectasis 4) recent bronchoscopy negative for EB obstruction, malignancy 5) persistent pleural friction rub   PLAN: 1) again counseled re: smoking cessation  2) ROV 6 weeks after repeat CT chest   Merton Border, MD PCCM service Mobile [phone] Pager [phone] 10/08/2015

## 2015-10-10 ENCOUNTER — Other Ambulatory Visit: Payer: Self-pay | Admitting: *Deleted

## 2015-10-10 DIAGNOSIS — R918 Other nonspecific abnormal finding of lung field: Secondary | ICD-10-CM

## 2015-11-14 ENCOUNTER — Ambulatory Visit
Admission: RE | Admit: 2015-11-14 | Discharge: 2015-11-14 | Disposition: A | Discharge status: Home / Self Care | Payer: BLUE CROSS/BLUE SHIELD | Source: Ambulatory Visit | Attending: Pulmonary Disease | Admitting: Pulmonary Disease

## 2015-11-14 DIAGNOSIS — I700 Atherosclerosis of aorta: Secondary | ICD-10-CM | POA: Diagnosis not present

## 2015-11-14 DIAGNOSIS — R918 Other nonspecific abnormal finding of lung field: Secondary | ICD-10-CM | POA: Insufficient documentation

## 2015-11-18 ENCOUNTER — Encounter: Payer: Self-pay | Admitting: Pulmonary Disease

## 2015-11-18 ENCOUNTER — Ambulatory Visit (INDEPENDENT_AMBULATORY_CARE_PROVIDER_SITE_OTHER): Payer: BLUE CROSS/BLUE SHIELD | Admitting: Pulmonary Disease

## 2015-11-18 VITALS — BP 142/82 | HR 86 | Ht 64.0 in | Wt 157.0 lb

## 2015-11-18 DIAGNOSIS — F17200 Nicotine dependence, unspecified, uncomplicated: Secondary | ICD-10-CM | POA: Diagnosis not present

## 2015-11-18 DIAGNOSIS — J432 Centrilobular emphysema: Secondary | ICD-10-CM | POA: Diagnosis not present

## 2015-11-18 DIAGNOSIS — R918 Other nonspecific abnormal finding of lung field: Secondary | ICD-10-CM

## 2015-11-18 NOTE — Patient Instructions (Addendum)
1) Stop aspirin now in anticipation of lung biopsy next week  2) I have ordered whole body PET scan which is a study to assess likelihood of cancer and its extent  3) Referral to Oncology (cancer doctors)

## 2015-11-20 NOTE — Progress Notes (Signed)
PULMONARY OFFICE FOLLOW UP NOTE  Requesting MD/Service: Holley Raring Date of initial consultation: 09/19/15 Reason for consultation: abnormal CT chest PT PROFILE: 69 F smoker referred for evaluation of RLL volume loss and infiltrate INITIAL HPI:  60 F who initially presented to [place] 5 wks ago with cough productive of "phlegm" and low grade fever. A CXR revealed RLL volume loss and AS dz. She was treated with abx and a CT chest was performed which is reviewed below. She is referred for further evaluation of the same. She indicates that she is "much better". She denies dyspnea, CP, fever, purulent sputum, hemoptysis, LE edema and calf tenderness. She has CKD and this CT chest was reviewed by Dr Holley Raring with the patient and this referral made INITIAL IMPRESSION: 1) recent PNA,, 2) persistent RLL infiltrate and atelectasis, 3) pulmonary nodule, 4) smoker, 5) prominent pleural friction rub (asymptomatic) INITIAL PLAN:  1) discussed smoking cessation 2) bronchoscopy scheduled for 09/22/15 3) ROV 10/06/15. I will call her with results after they are available to me.  FOB 09/22/15:  Mild to moderate chronic bronchitis changes. No endobronchial lesions. Brushings/washings from RLL positive for inflammatory cells, no malignancy  ROV 10/06/15: remains asymptomatic. Exam with persistent pleural friction rub. CXR with persistent RLL consolidation and atelectasis. PLAN: repeat CT chest in 6 weeks with ROV to follow  CT chest (repeat) 11/14/15: Mediastinum/Nodes: Upper normal sized precarinal and RIGHT paratracheal nodes. Additional scattered normal sized mediastinal nodes. Slightly increased soft tissue at the subcarinal region which may represent enlarged node 16 mm short axis image 65. Base of cervical region unremarkable. Underlying emphysematous changes.  Lungs/Pleura: Underlying emphysematous changes. RIGHT middle lobe macrolobulated nodule 3.0 x 2.0 cm image 95 increased from the 2.2 x 1.1 cm on  the previous exam. Additional new nodules in RIGHT middle lobe largest 10 mm diameter image 91. New rounded area of infiltrate or sub solid mass in posterior RIGHT middle lobe 2.8 x 1.5 cm image 102. Increase in size of a RIGHT middle lobe nodule from 6 mm to 9 mm, image 85. New nodule in lingula adjacent to LEFT major fissure 6 mm diameter image 79. Additional areas of developing or progressive nodularity in both lungs also seen. Persistent consolidation and volume loss in RIGHT lower lobe. Central peribronchial thickening. RIGHT lower lobe opacity extends to the inferior aspect of the RIGHT pulmonary hilum, cannot exclude confluent RIGHT hilar adenopathy.  SUBJ: No new complaints. Denies CP, fever, purulent sputum, hemoptysis, LE edema and calf tenderness. Has worked on smoking cessation and has not smoked in past 12 days. Here to discuss results of repeat CT chest and what to do next  Vitals:   11/18/15 1147  BP: (!) 142/82  Pulse: 86  SpO2: 96%  Weight: 157 lb (71.2 kg)  Height: '5\' 4"'$  (1.626 m)     EXAM:  Gen: WDWN, NAD HEENT: WNL Neck: Supple without LAN Lungs: BS diminished in R base, dull in R base, persistent pleural friction rub in R base, no wheezes Cardiovascular: RRR, no murmurs noted Abdomen: Soft, nontender, normal BS Ext: without clubbing, cyanosis, edema Neuro: CNs grossly intact  DATA:  As above  IMPRESSION:   1) Smoker 2) Recent PNA 3) persistent RLL consolidation and atelectasis 4) recent bronchoscopy negative for EB obstruction, malignancy 5) persistent pleural friction rub 6) Multiple pulmonary nodules and masses increasing in size since prior CT chest in August -   I reviewed the recent CT chest with the patient in detail and expressed  my concern regarding the likelihood of cancer. I discussed with Dr Mortimer Fries who also reviewed the films to consider ENB/EBUS. Also discussed with Dr Pascal Lux of IR to consider TTNA   PLAN: 1) PET scan ordered 2) we will plan on  ENB/EBUS by Dr Mortimer Fries 11/22/15 but depending on the PET findings, this could be changed to TTNA 3) She has been instructed to stop aspirin and avoid NSAIDs 4) Referral to med onc has been requested.  5) I have not scheduled follow up with me but will decide on appropriate follow up depending on results of above plan   Merton Border, MD PCCM service Mobile [phone] Pager [phone] 11/20/2015

## 2015-11-21 ENCOUNTER — Encounter
Admission: RE | Admit: 2015-11-21 | Discharge: 2015-11-21 | Disposition: A | Discharge status: Home / Self Care | Payer: BLUE CROSS/BLUE SHIELD | Source: Ambulatory Visit | Attending: Internal Medicine | Admitting: Internal Medicine

## 2015-11-21 ENCOUNTER — Encounter
Admission: RE | Admit: 2015-11-21 | Discharge: 2015-11-21 | Disposition: A | Discharge status: Home / Self Care | Payer: BLUE CROSS/BLUE SHIELD | Source: Ambulatory Visit | Attending: Pulmonary Disease | Admitting: Pulmonary Disease

## 2015-11-21 DIAGNOSIS — I10 Essential (primary) hypertension: Secondary | ICD-10-CM | POA: Diagnosis not present

## 2015-11-21 DIAGNOSIS — R918 Other nonspecific abnormal finding of lung field: Secondary | ICD-10-CM

## 2015-11-21 DIAGNOSIS — J449 Chronic obstructive pulmonary disease, unspecified: Secondary | ICD-10-CM | POA: Diagnosis not present

## 2015-11-21 DIAGNOSIS — E7800 Pure hypercholesterolemia, unspecified: Secondary | ICD-10-CM | POA: Diagnosis not present

## 2015-11-21 DIAGNOSIS — Z880 Allergy status to penicillin: Secondary | ICD-10-CM | POA: Diagnosis not present

## 2015-11-21 DIAGNOSIS — Z881 Allergy status to other antibiotic agents status: Secondary | ICD-10-CM | POA: Diagnosis not present

## 2015-11-21 DIAGNOSIS — Z87891 Personal history of nicotine dependence: Secondary | ICD-10-CM | POA: Diagnosis not present

## 2015-11-21 HISTORY — DX: Malignant (primary) neoplasm, unspecified: C80.1

## 2015-11-21 LAB — BASIC METABOLIC PANEL
Anion gap: 9 (ref 5–15)
BUN: 18 mg/dL (ref 6–20)
CHLORIDE: 107 mmol/L (ref 101–111)
CO2: 23 mmol/L (ref 22–32)
CREATININE: 1.06 mg/dL — AB (ref 0.44–1.00)
Calcium: 9.4 mg/dL (ref 8.9–10.3)
GFR calc Af Amer: 60 mL/min — ABNORMAL LOW (ref >60)
GFR calc non Af Amer: 52 mL/min — ABNORMAL LOW (ref >60)
Glucose, Bld: 116 mg/dL — ABNORMAL HIGH (ref 65–99)
Potassium: 3.8 mmol/L (ref 3.5–5.1)
Sodium: 139 mmol/L (ref 135–145)

## 2015-11-21 LAB — CBC
HCT: 45.5 % (ref 35.0–47.0)
Hemoglobin: 14.8 g/dL (ref 12.0–16.0)
MCH: 30.4 pg (ref 26.0–34.0)
MCHC: 32.6 g/dL (ref 32.0–36.0)
MCV: 93.1 fL (ref 80.0–100.0)
PLATELETS: 355 10*3/uL (ref 150–440)
RBC: 4.88 MIL/uL (ref 3.80–5.20)
RDW: 13.4 % (ref 11.5–14.5)
WBC: 10 10*3/uL (ref 3.6–11.0)

## 2015-11-21 LAB — GLUCOSE, CAPILLARY: Glucose-Capillary: 110 mg/dL — ABNORMAL HIGH (ref 65–99)

## 2015-11-21 MED ORDER — FLUDEOXYGLUCOSE F - 18 (FDG) INJECTION
12.0000 | Freq: Once | INTRAVENOUS | Status: AC
  Administered 2015-11-21: 12.84 via INTRAVENOUS

## 2015-11-21 NOTE — Patient Instructions (Signed)
Your procedure is scheduled on: 11/22/15 Report to Day Surgery. 2nd floor medical mall entrance To find out your arrival time please call [phone] between 1PM - 3PM on 11/21/15.  Remember: Instructions that are not followed completely may result in serious medical risk, up to and including death, or upon the discretion of your surgeon and anesthesiologist your surgery may need to be rescheduled.    __X__ 1. Do not eat food or drink liquids after midnight. No gum chewing or hard candies.     __X__ 2. No Alcohol for 24 hours before or after surgery.   ____ 3. Bring all medications with you on the day of surgery if instructed.    __X__ 4. Notify your doctor if there is any change in your medical condition     (cold, fever, infections).     Do not wear jewelry, make-up, hairpins, clips or nail polish.  Do not wear lotions, powders, or perfumes.   Do not shave 48 hours prior to surgery. Men may shave face and neck.  Do not bring valuables to the hospital.    [facility] is not responsible for any belongings or valuables.               Contacts, dentures or bridgework may not be worn into surgery.  Leave your suitcase in the car. After surgery it may be brought to your room.  For patients admitted to the hospital, discharge time is determined by your                treatment team.   Patients discharged the day of surgery will not be allowed to drive home.   Please read over the following fact sheets that you were given:     __x__ Take these medicines the morning of surgery with A SIP OF WATER:    1. amlodipine  2. hydralazine  3. metoprolol  4.  5.  6.  ____ Fleet Enema (as directed)   ____ Use CHG Soap as directed  ____ Use inhalers on the day of surgery  ____ Stop metformin 2 days prior to surgery    ____ Take 1/2 of usual insulin dose the night before surgery and none on the morning of surgery.   ____ Stop Coumadin/Plavix/aspirin on stopped 11/18/15  ____ Stop  Anti-inflammatories on    ____ Stop supplements until after surgery.    ____ Bring C-Pap to the hospital.

## 2015-11-22 ENCOUNTER — Encounter
Admission: RE | Disposition: A | Discharge status: Home / Self Care | Payer: Self-pay | Source: Ambulatory Visit | Attending: Internal Medicine

## 2015-11-22 ENCOUNTER — Telehealth: Payer: Self-pay | Admitting: Internal Medicine

## 2015-11-22 ENCOUNTER — Ambulatory Visit: Payer: BLUE CROSS/BLUE SHIELD | Admitting: Anesthesiology

## 2015-11-22 ENCOUNTER — Ambulatory Visit
Admission: RE | Admit: 2015-11-22 | Discharge: 2015-11-22 | Disposition: A | Discharge status: Home / Self Care | Payer: BLUE CROSS/BLUE SHIELD | Source: Ambulatory Visit | Attending: Internal Medicine | Admitting: Internal Medicine

## 2015-11-22 ENCOUNTER — Encounter: Payer: Self-pay | Admitting: *Deleted

## 2015-11-22 DIAGNOSIS — J449 Chronic obstructive pulmonary disease, unspecified: Secondary | ICD-10-CM | POA: Insufficient documentation

## 2015-11-22 DIAGNOSIS — Z87891 Personal history of nicotine dependence: Secondary | ICD-10-CM | POA: Insufficient documentation

## 2015-11-22 DIAGNOSIS — Z880 Allergy status to penicillin: Secondary | ICD-10-CM | POA: Insufficient documentation

## 2015-11-22 DIAGNOSIS — I10 Essential (primary) hypertension: Secondary | ICD-10-CM | POA: Insufficient documentation

## 2015-11-22 DIAGNOSIS — C3490 Malignant neoplasm of unspecified part of unspecified bronchus or lung: Secondary | ICD-10-CM

## 2015-11-22 DIAGNOSIS — E7800 Pure hypercholesterolemia, unspecified: Secondary | ICD-10-CM | POA: Insufficient documentation

## 2015-11-22 DIAGNOSIS — Z881 Allergy status to other antibiotic agents status: Secondary | ICD-10-CM | POA: Insufficient documentation

## 2015-11-22 DIAGNOSIS — R918 Other nonspecific abnormal finding of lung field: Secondary | ICD-10-CM | POA: Diagnosis not present

## 2015-11-22 HISTORY — PX: ENDOBRONCHIAL ULTRASOUND: SHX5096

## 2015-11-22 LAB — GLUCOSE, CAPILLARY: Glucose-Capillary: 98 mg/dL (ref 65–99)

## 2015-11-22 SURGERY — ENDOBRONCHIAL ULTRASOUND (EBUS)
Anesthesia: General

## 2015-11-22 MED ORDER — FENTANYL CITRATE (PF) 100 MCG/2ML IJ SOLN: 25.0000 ug | INTRAMUSCULAR | Status: DC | PRN

## 2015-11-22 MED ORDER — MIDAZOLAM HCL 5 MG/5ML IJ SOLN
INTRAMUSCULAR | Status: DC | PRN
  Administered 2015-11-22: 2 mg via INTRAVENOUS

## 2015-11-22 MED ORDER — FAMOTIDINE 20 MG PO TABS
20.0000 mg | ORAL_TABLET | Freq: Once | ORAL | Status: AC
  Administered 2015-11-22: 20 mg via ORAL

## 2015-11-22 MED ORDER — ROCURONIUM BROMIDE 100 MG/10ML IV SOLN
INTRAVENOUS | Status: DC | PRN
Start: 2015-11-22 — End: 2015-11-22
  Administered 2015-11-22: 40 mg via INTRAVENOUS

## 2015-11-22 MED ORDER — FAMOTIDINE 20 MG PO TABS
ORAL_TABLET | ORAL | Status: AC
  Filled 2015-11-22: qty 1

## 2015-11-22 MED ORDER — ONDANSETRON HCL 4 MG/2ML IJ SOLN
INTRAMUSCULAR | Status: DC | PRN
  Administered 2015-11-22: 4 mg via INTRAVENOUS

## 2015-11-22 MED ORDER — PHENYLEPHRINE HCL 10 MG/ML IJ SOLN
INTRAMUSCULAR | Status: DC | PRN
  Administered 2015-11-22: 50 ug via INTRAVENOUS

## 2015-11-22 MED ORDER — PROMETHAZINE HCL 25 MG/ML IJ SOLN: 6.2500 mg | INTRAMUSCULAR | Status: DC | PRN

## 2015-11-22 MED ORDER — LIDOCAINE HCL (CARDIAC) 20 MG/ML IV SOLN
INTRAVENOUS | Status: DC | PRN
  Administered 2015-11-22: 50 mg via INTRAVENOUS

## 2015-11-22 MED ORDER — DEXAMETHASONE SODIUM PHOSPHATE 10 MG/ML IJ SOLN
INTRAMUSCULAR | Status: DC | PRN
  Administered 2015-11-22: 5 mg via INTRAVENOUS

## 2015-11-22 MED ORDER — PROPOFOL 10 MG/ML IV BOLUS
INTRAVENOUS | Status: DC | PRN
  Administered 2015-11-22: 120 mg via INTRAVENOUS

## 2015-11-22 MED ORDER — SUGAMMADEX SODIUM 200 MG/2ML IV SOLN
INTRAVENOUS | Status: DC | PRN
  Administered 2015-11-22: 150 mg via INTRAVENOUS

## 2015-11-22 MED ORDER — LACTATED RINGERS IV SOLN
INTRAVENOUS | Status: DC
  Administered 2015-11-22: 13:00:00 via INTRAVENOUS

## 2015-11-22 MED ORDER — FENTANYL CITRATE (PF) 100 MCG/2ML IJ SOLN
INTRAMUSCULAR | Status: DC | PRN
  Administered 2015-11-22 (×2): 50 ug via INTRAVENOUS

## 2015-11-22 MED ORDER — MEPERIDINE HCL 25 MG/ML IJ SOLN: 6.2500 mg | INTRAMUSCULAR | Status: DC | PRN

## 2015-11-22 NOTE — Interval H&P Note (Signed)
History and Physical Interval Note:  11/22/2015 1:01 PM  Christina Sharp  has presented today for surgery, with the diagnosis of lung mass  The various methods of treatment have been discussed with the patient and family. After consideration of risks, benefits and other options for treatment, the patient has consented to  Procedure(s): ENDOBRONCHIAL ULTRASOUND (N/A) as a surgical intervention .  The patient's history has been reviewed, patient examined, no change in status, stable for surgery.  I have reviewed the patient's chart and labs.  Questions were answered to the patient's satisfaction.     Flora Lipps

## 2015-11-22 NOTE — H&P (View-Only) (Signed)
PULMONARY OFFICE FOLLOW UP NOTE  Requesting MD/Service: Holley Raring Date of initial consultation: 09/19/15 Reason for consultation: abnormal CT chest PT PROFILE: 12 F smoker referred for evaluation of RLL volume loss and infiltrate INITIAL HPI:  36 F who initially presented to [place] 5 wks ago with cough productive of "phlegm" and low grade fever. A CXR revealed RLL volume loss and AS dz. She was treated with abx and a CT chest was performed which is reviewed below. She is referred for further evaluation of the same. She indicates that she is "much better". She denies dyspnea, CP, fever, purulent sputum, hemoptysis, LE edema and calf tenderness. She has CKD and this CT chest was reviewed by Dr Holley Raring with the patient and this referral made INITIAL IMPRESSION: 1) recent PNA,, 2) persistent RLL infiltrate and atelectasis, 3) pulmonary nodule, 4) smoker, 5) prominent pleural friction rub (asymptomatic) INITIAL PLAN:  1) discussed smoking cessation 2) bronchoscopy scheduled for 09/22/15 3) ROV 10/06/15. I will call her with results after they are available to me.  FOB 09/22/15:  Mild to moderate chronic bronchitis changes. No endobronchial lesions. Brushings/washings from RLL positive for inflammatory cells, no malignancy  ROV 10/06/15: remains asymptomatic. Exam with persistent pleural friction rub. CXR with persistent RLL consolidation and atelectasis. PLAN: repeat CT chest in 6 weeks with ROV to follow  CT chest (repeat) 11/14/15: Mediastinum/Nodes: Upper normal sized precarinal and RIGHT paratracheal nodes. Additional scattered normal sized mediastinal nodes. Slightly increased soft tissue at the subcarinal region which may represent enlarged node 16 mm short axis image 65. Base of cervical region unremarkable. Underlying emphysematous changes.  Lungs/Pleura: Underlying emphysematous changes. RIGHT middle lobe macrolobulated nodule 3.0 x 2.0 cm image 95 increased from the 2.2 x 1.1 cm on  the previous exam. Additional new nodules in RIGHT middle lobe largest 10 mm diameter image 91. New rounded area of infiltrate or sub solid mass in posterior RIGHT middle lobe 2.8 x 1.5 cm image 102. Increase in size of a RIGHT middle lobe nodule from 6 mm to 9 mm, image 85. New nodule in lingula adjacent to LEFT major fissure 6 mm diameter image 79. Additional areas of developing or progressive nodularity in both lungs also seen. Persistent consolidation and volume loss in RIGHT lower lobe. Central peribronchial thickening. RIGHT lower lobe opacity extends to the inferior aspect of the RIGHT pulmonary hilum, cannot exclude confluent RIGHT hilar adenopathy.  SUBJ: No new complaints. Denies CP, fever, purulent sputum, hemoptysis, LE edema and calf tenderness. Has worked on smoking cessation and has not smoked in past 12 days. Here to discuss results of repeat CT chest and what to do next  Vitals:   11/18/15 1147  BP: (!) 142/82  Pulse: 86  SpO2: 96%  Weight: 157 lb (71.2 kg)  Height: '5\' 4"'$  (1.626 m)     EXAM:  Gen: WDWN, NAD HEENT: WNL Neck: Supple without LAN Lungs: BS diminished in R base, dull in R base, persistent pleural friction rub in R base, no wheezes Cardiovascular: RRR, no murmurs noted Abdomen: Soft, nontender, normal BS Ext: without clubbing, cyanosis, edema Neuro: CNs grossly intact  DATA:  As above  IMPRESSION:   1) Smoker 2) Recent PNA 3) persistent RLL consolidation and atelectasis 4) recent bronchoscopy negative for EB obstruction, malignancy 5) persistent pleural friction rub 6) Multiple pulmonary nodules and masses increasing in size since prior CT chest in August -   I reviewed the recent CT chest with the patient in detail and expressed  my concern regarding the likelihood of cancer. I discussed with Dr Mortimer Fries who also reviewed the films to consider ENB/EBUS. Also discussed with Dr Pascal Lux of IR to consider TTNA   PLAN: 1) PET scan ordered 2) we will plan on  ENB/EBUS by Dr Mortimer Fries 11/22/15 but depending on the PET findings, this could be changed to TTNA 3) She has been instructed to stop aspirin and avoid NSAIDs 4) Referral to med onc has been requested.  5) I have not scheduled follow up with me but will decide on appropriate follow up depending on results of above plan   Merton Border, MD PCCM service Mobile [phone] Pager [phone] 11/20/2015

## 2015-11-22 NOTE — Anesthesia Preprocedure Evaluation (Addendum)
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 on medications (-) CAD and (-) Past MI  Rhythm:Regular Rate:Normal - Systolic murmurs and - Diastolic murmurs    Neuro/Psych negative psych ROS   GI/Hepatic negative GI ROS, Neg liver ROS,   Endo/Other  negative endocrine ROSneg diabetes  Renal/GU CRFRenal disease     Musculoskeletal negative musculoskeletal ROS (+)   Abdominal (+) - obese,   Peds  Hematology negative hematology ROS (+)   Anesthesia Other Findings Past Medical History: No date: Cancer ([place])     Comment: basal cell  No date: Hypercholesteremia No date: Hypertension No date: Renal disorder   Reproductive/Obstetrics negative OB ROS                            Anesthesia Physical Anesthesia Plan  ASA: II  Anesthesia Plan: General   Post-op Pain Management:    Induction: Intravenous  Airway Management Planned: Oral ETT  Additional Equipment:   Intra-op Plan:   Post-operative Plan: Extubation in OR  Informed Consent: I have reviewed the patients History and Physical, chart, labs and discussed the procedure including the risks, benefits and alternatives for the proposed anesthesia with the patient or authorized representative who has indicated his/her understanding and acceptance.   Dental advisory given  Plan Discussed with: Anesthesiologist and  CRNA  Anesthesia Plan Comments:         Anesthesia Quick Evaluation

## 2015-11-22 NOTE — Op Note (Signed)
[facility] Patient Name: Christina Sharp Procedure Date: 11/22/2015 1:09 PM MRN: [id number] Account #: [id number] Date of Birth: 1945-11-21 Admit Type: Outpatient Age: 70 Room: [facility] PROCEDURE RM 01 Gender: Female Note Status: Finalized Attending MD: Patricia Pesa , MD Procedure:         Bronchoscopy Indications:       Right middle lobe mass Providers:         Patricia Pesa, MD Referring MD:       Medicines:         General Anesthesia Complications:     No immediate complications. Estimated blood loss: Minimal                    I was unable to pass EBUS scope into RML, I then proceeded                     with Regular Broncoscopy and obtained blind needle FNA                     from RML if the inflammed area in RML                    FNA 19 guage Transbronchial x 3 Procedure:         Pre-Anesthesia Assessment:                    - A History and Physical has been performed. The patient's                     medications, allergies and sensitivities have been                     reviewed.                    After obtaining informed consent, the bronchoscope was                     passed under direct vision. Throughout the procedure, the                     patient's blood pressure, pulse, and oxygen saturations                     were monitored continuously. the Bronchofibervideoscope                     Olympus [id number] was introduced through the                     mouth, via the endotracheal tube and advanced to the right                     lung only. The procedure was accomplished without                     difficulty. The patient tolerated the procedure well. Findings:      Right Lung Abnormalities: An area of acutely inflamed mucosa was found       in the right middle lobe.      Transbronchial needle aspirations of a lesion were performed in the       medial segment of the right middle lobe using a fine (19 gauge) needle       and sent for  routine cytology. Three samples were  obtained. Impression:        - Right middle lobe mass                    - Acute mucosal inflammation was visualized in the right                     middle lobe.                    - No specimens collected. Recommendation:    - Await cytology results. Patricia Pesa MD Patricia Pesa, MD 11/22/2015 2:20:08 PM This report has been signed electronically. Number of Addenda: 0 Note Initiated On: 11/22/2015 1:09 PM      [facility]

## 2015-11-22 NOTE — Anesthesia Procedure Notes (Signed)
Procedure Name: Intubation Date/Time: 11/22/2015 1:25 PM Performed by: Dionne Bucy Pre-anesthesia Checklist: Patient identified, Patient being monitored, Timeout performed, Emergency Drugs available and Suction available Patient Re-evaluated:Patient Re-evaluated prior to inductionOxygen Delivery Method: Circle system utilized Preoxygenation: Pre-oxygenation with 100% oxygen Intubation Type: IV induction Ventilation: Mask ventilation without difficulty Laryngoscope Size: Mac and 3 Grade View: Grade I Tube type: Oral Tube size: 8.0 mm Number of attempts: 1 Airway Equipment and Method: Stylet Placement Confirmation: ETT inserted through vocal cords under direct vision,  positive ETCO2 and breath sounds checked- equal and bilateral Secured at: 21 cm Tube secured with: Tape Dental Injury: Teeth and Oropharynx as per pre-operative assessment

## 2015-11-22 NOTE — Transfer of Care (Signed)
Immediate Anesthesia Transfer of Care Note  Patient: Christina Sharp  Procedure(s) Performed: Procedure(s): ENDOBRONCHIAL ULTRASOUND (N/A)  Patient Location: PACU  Anesthesia Type:General  Level of Consciousness: awake and patient cooperative  Airway & Oxygen Therapy: Patient Spontanous Breathing and Patient connected to face mask oxygen  Post-op Assessment: Report given to RN and Post -op Vital signs reviewed and stable  Post vital signs: Reviewed and stable  Last Vitals:  Vitals:   11/22/15 1230 11/22/15 1432  BP: (!) 143/90 108/74  Pulse: 90 93  Resp: 16 13  Temp: 36.9 C 36.4 C    Last Pain:  Vitals:   11/22/15 1230  TempSrc: Oral         Complications: No apparent anesthesia complications

## 2015-11-22 NOTE — Telephone Encounter (Signed)
Spoke with Christina Sharp in recovery and informed them to let the pt know that we will call her to schedule her bronch f/u appt. When would you like to see her back. Thanks.

## 2015-11-22 NOTE — Anesthesia Postprocedure Evaluation (Signed)
Anesthesia Post Note  Patient: Christina Sharp  Procedure(s) Performed: Procedure(s) (LRB): ENDOBRONCHIAL ULTRASOUND (N/A)  Patient location during evaluation: PACU Anesthesia Type: General Level of consciousness: awake and alert Pain management: pain level controlled Vital Signs Assessment: post-procedure vital signs reviewed and stable Respiratory status: spontaneous breathing, nonlabored ventilation and respiratory function stable Cardiovascular status: blood pressure returned to baseline and stable Postop Assessment: no signs of nausea or vomiting Anesthetic complications: no    Last Vitals:  Vitals:   11/22/15 1230 11/22/15 1432  BP: (!) 143/90 108/74  Pulse: 90 93  Resp: 16 13  Temp: 36.9 C 36.4 C    Last Pain:  Vitals:   11/22/15 1230  TempSrc: Oral                 Paul Torpey

## 2015-11-22 NOTE — Telephone Encounter (Signed)
[place] recovery room calling, would like to know when pt needs f/u from bronch u/s. Please call and advise.

## 2015-11-22 NOTE — Discharge Instructions (Signed)
Flexible Bronchoscopy, Care After Refer to this sheet in the next few weeks. These instructions provide you with information on caring for yourself after your procedure. Your health care provider may also give you more specific instructions. Your treatment has been planned according to current medical practices, but problems sometimes occur. Call your health care provider if you have any problems or questions after your procedure.  WHAT TO EXPECT AFTER THE PROCEDURE It is normal to have the following symptoms for 24-48 hours after the procedure:   Increased cough.  Low-grade fever.  Sore throat or hoarse voice.  Small streaks of blood in your thick spit (sputum) if tissue samples were taken (biopsy). HOME CARE INSTRUCTIONS   Do not eat or drink anything for 2 hours after your procedure. Your nose and throat were numbed by medicine. If you try to eat or drink before the medicine wears off, food or drink could go into your lungs or you could burn yourself. After the numbness is gone and your cough and gag reflexes have returned, you may eat soft food and drink liquids slowly.   The day after the procedure, you can go back to your normal diet.   You may resume normal activities.   Keep all follow-up visits as directed by your health care provider. It is important to keep all your appointments, especially if tissue samples were taken for testing (biopsy). SEEK IMMEDIATE MEDICAL CARE IF:   You have increasing shortness of breath.   You become light-headed or faint.   You have chest pain.   You have any new concerning symptoms.  You cough up more than a small amount of blood.  The amount of blood you cough up increases. MAKE SURE YOU:  Understand these instructions.  Will watch your condition.  Will get help right away if you are not doing well or get worse.   This information is not intended to replace advice given to you by your health care provider. Make sure you discuss  any questions you have with your health care provider.   Document Released: 08/04/2004 Document Revised: 02/05/2014 Document Reviewed: 09/19/2012 Elsevier Interactive Patient Education 2016 [place]   1) The drugs that you were given will stay in your system until tomorrow so for the next 24 hours you should not:  A) Drive an automobile B) Make any legal decisions C) Drink any alcoholic beverage   2) You may resume regular meals tomorrow.  Today it is better to start with liquids and gradually work up to solid foods.  You may eat anything you prefer, but it is better to start with liquids, then soup and crackers, and gradually work up to solid foods.   3) Please notify your doctor immediately if you have any unusual bleeding, trouble breathing, redness and pain at the surgery site, drainage, fever, or pain not relieved by medication.    4) Additional Instructions:        Please contact your physician with any problems or Same Day Surgery at [phone], Monday through Friday 6 am to 4 pm, or [place] at [facility] number at [phone].

## 2015-11-23 ENCOUNTER — Inpatient Hospital Stay: Payer: BLUE CROSS/BLUE SHIELD

## 2015-11-23 ENCOUNTER — Inpatient Hospital Stay: Payer: BLUE CROSS/BLUE SHIELD | Attending: Hematology and Oncology | Admitting: Hematology and Oncology

## 2015-11-23 ENCOUNTER — Other Ambulatory Visit: Payer: Self-pay | Admitting: *Deleted

## 2015-11-23 ENCOUNTER — Encounter: Payer: Self-pay | Admitting: Hematology and Oncology

## 2015-11-23 VITALS — BP 132/75 | HR 94 | Temp 98.0°F | Resp 18 | Ht 64.0 in | Wt 155.2 lb

## 2015-11-23 DIAGNOSIS — R05 Cough: Secondary | ICD-10-CM | POA: Diagnosis not present

## 2015-11-23 DIAGNOSIS — Z7982 Long term (current) use of aspirin: Secondary | ICD-10-CM | POA: Diagnosis not present

## 2015-11-23 DIAGNOSIS — R918 Other nonspecific abnormal finding of lung field: Secondary | ICD-10-CM

## 2015-11-23 DIAGNOSIS — I10 Essential (primary) hypertension: Secondary | ICD-10-CM | POA: Diagnosis not present

## 2015-11-23 DIAGNOSIS — J9811 Atelectasis: Secondary | ICD-10-CM | POA: Diagnosis not present

## 2015-11-23 DIAGNOSIS — R599 Enlarged lymph nodes, unspecified: Secondary | ICD-10-CM | POA: Insufficient documentation

## 2015-11-23 DIAGNOSIS — E7800 Pure hypercholesterolemia, unspecified: Secondary | ICD-10-CM

## 2015-11-23 DIAGNOSIS — Z87891 Personal history of nicotine dependence: Secondary | ICD-10-CM

## 2015-11-23 DIAGNOSIS — J449 Chronic obstructive pulmonary disease, unspecified: Secondary | ICD-10-CM | POA: Diagnosis not present

## 2015-11-23 DIAGNOSIS — Z789 Other specified health status: Secondary | ICD-10-CM | POA: Insufficient documentation

## 2015-11-23 DIAGNOSIS — Z85828 Personal history of other malignant neoplasm of skin: Secondary | ICD-10-CM | POA: Diagnosis not present

## 2015-11-23 DIAGNOSIS — Z79899 Other long term (current) drug therapy: Secondary | ICD-10-CM | POA: Diagnosis not present

## 2015-11-23 DIAGNOSIS — N2889 Other specified disorders of kidney and ureter: Secondary | ICD-10-CM

## 2015-11-23 LAB — HEPATIC FUNCTION PANEL
ALT: 18 U/L (ref 14–54)
AST: 23 U/L (ref 15–41)
Albumin: 4.4 g/dL (ref 3.5–5.0)
Alkaline Phosphatase: 83 U/L (ref 38–126)
Bilirubin, Direct: 0.1 mg/dL (ref 0.1–0.5)
Indirect Bilirubin: 0.8 mg/dL (ref 0.3–0.9)
Total Bilirubin: 0.9 mg/dL (ref 0.3–1.2)
Total Protein: 8 g/dL (ref 6.5–8.1)

## 2015-11-23 LAB — LACTATE DEHYDROGENASE: LDH: 150 U/L (ref 98–192)

## 2015-11-23 NOTE — Progress Notes (Signed)
Patient here today as new evaluation regarding lung mass.,  Referred by Dr. Alva Garnet.

## 2015-11-23 NOTE — Progress Notes (Signed)
[facility] day:  11/23/2015  Chief Complaint: MODENA BELLEMARE is a 70 y.o. female with a right lung mass who is referred in consultation by Dr. Alva Garnet for assessment and management.  HPI: The patient has a 50-pack-year smoking history.  She presented to the [facility] acute care clinic at [facility] on 08/13/2015 with a low grade fever and a cough productive of phlegm.  Chest x-ray revealed right lower lobe atelectasis concerning for central endobronchial obstruction.  She states that she was treated with 2 rounds of antibiotics.  Chest CT without contrast on 09/06/2015 revealed persistent right lower lobe consolidation with evidence of right middle lobe nodules and somewhat spiculated density at the lung base. Findings were concerning for underlying malignancy.  She was initially seen in consultation by Dr. Merton Border on 09/19/2015.  She underwent bronchoscopy on 09/22/2015 by Dr. Alva Garnet.  Findings  revealed mild to moderate chronic bronchitis changes with no endobronchial lesions. Brushings from the right lower lobe were negative for malignancy.  There was abundant bronchial cells admixed with inflammatory cells.    PET scan on 11/21/2015 revealed findings suspicious for right lower lobe primary bronchogenic carcinoma.  There was a 2.9 x 3 cm central right lower lobe lung lesion (SUV 7.0). Areas of surrounding more patchy right lower lobe opacity were also suspicious for metastasis or synchronous primaries. Some of this appearance could be due to postobstructive pneumonitis and atelectasis. There were bilateral pulmonary nodules, including hypermetabolic right upper and right lower lobe pulmonary nodules. These were suspicious for metastatic disease.  There was no thoracic nodal hypermetabolism.  There was an area of soft tissue fullness within the high left neck that was suspicious for an enlarged lateral retropharyngeal node, favored to represent a  skip nodal metastasis from the patient's lung primary.  There was an indeterminate right renal lesion. This could be evaluated with renal ultrasound or a dedicated pre and post contrast abdominal MRI.  She underwent bronchoscopy on 11/22/2015 by Dr. Patricia Pesa.  He was unable to pass an EBUS scope in the RML.  Findings revealed right lung abnormalities. An area of acutely inflamed mucosa was found in the right middle lobe.  Transbronchial needle aspirations of a lesion were performed in the medial segment of the right middle lobe.  Pathology is pending.  Symptomatically, she feels "fine".  She has a little cough.  She denies any systemic complaints.   Past Medical History:  Diagnosis Date  . Cancer ([place])    basal cell   . Emphysema of lung ([place])   . Hypercholesteremia   . Hypertension   . Renal disorder     Past Surgical History:  Procedure Laterality Date  . COLONOSCOPY    . FLEXIBLE BRONCHOSCOPY N/A 09/22/2015   Procedure: FLEXIBLE BRONCHOSCOPY;  Surgeon: [name], MD;  Location: [facility] ORS;  Service: Pulmonary;  Laterality: N/A;    Family History  Problem Relation Age of Onset  . Alzheimer's disease Mother   . Heart disease Father     Social History:  reports that she quit smoking about 2 weeks ago. Her smoking use included Cigarettes. She has a 22.50 pack-year smoking history. She has never used smokeless tobacco. She reports that she drinks alcohol. She reports that she does not use drugs.  She smoked 1 pack a day for 45-50 years.  She has not smoked in the past 16 days.  The patient is from [place].  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  She denies any exposure to radiation or toxins.  The patient is alone today.  Allergies:  Allergies  Allergen Reactions  . Augmentin [Amoxicillin-Pot Clavulanate] Hives  . Penicillins Hives    Current Medications: Current Outpatient Prescriptions  Medication Sig Dispense Refill  . amLODipine  (NORVASC) 10 MG tablet Take 10 mg by mouth daily.    Marland Kitchen aspirin 81 MG tablet Take 81 mg by mouth daily.    Marland Kitchen atorvastatin (LIPITOR) 20 MG tablet Take 20 mg by mouth at bedtime.     . hydrALAZINE (APRESOLINE) 50 MG tablet Take 50 mg by mouth 3 (three) times daily.    . metoprolol succinate (TOPROL-XL) 100 MG 24 hr tablet Take 100 mg by mouth daily. Take with or immediately following a meal.    . sodium bicarbonate 650 MG tablet Take 1,300 mg by mouth 2 (two) times daily.      No current facility-administered medications for this visit.     Review of Systems:  GENERAL:  Feels "fine".  Active.  No fevers, sweats or weight loss. PERFORMANCE STATUS (ECOG):  0 HEENT:  Sore throat after bronchoscopy.  No visual changes, runny nose, mouth sores or tenderness. Lungs:  Little cough.  No shortness of breath.  No hemoptysis. Cardiac:  No chest pain, palpitations, orthopnea, or PND. GI:  No nausea, vomiting, diarrhea, constipation, melena or hematochezia.  Last colonoscopy > 10 years ago. GU:  No urgency, frequency, dysuria, or hematuria. Breasts: No breast masses.  Last mammogram 4-5 years ago. Musculoskeletal:  No back pain.  No joint pain.  No muscle tenderness. Extremities:  No pain or swelling. Skin:  No rashes or skin changes. Neuro:  No headache, numbness or weakness, balance or coordination issues. Endocrine:  No diabetes, thyroid issues, hot flashes or night sweats. Psych:  No mood changes, depression or anxiety. Pain:  No focal pain. Review of systems:  All other systems reviewed and found to be negative.   Physical Exam: Blood pressure 132/75, pulse 94, temperature 98 F (36.7 C), temperature source Tympanic, resp. rate 18, height 5' 4"  (1.626 m), weight 155 lb 3.3 oz (70.4 kg). GENERAL:  Well developed, well nourished, woman sitting comfortably in the exam room in no acute distress. MENTAL STATUS:  Alert and oriented to person, place and time. HEAD:  Short gray hair.  Normocephalic,  atraumatic, face symmetric, no Cushingoid features. EYES:  Brown eyes.  Pupils equal round and reactive to light and accomodation.  No conjunctivitis or scleral icterus. ENT:  Oropharynx clear without lesion.  Tongue normal. Mucous membranes moist.  RESPIRATORY:  Clear to auscultation without rales, wheezes or rhonchi.  Soft rub right lower lobe. CARDIOVASCULAR:  Regular rate and rhythm without murmur, rub or gallop. ABDOMEN:  Soft, non-tender, with active bowel sounds, and no hepatosplenomegaly.  No masses. SKIN:  No rashes, ulcers or lesions. EXTREMITIES: No edema, no skin discoloration or tenderness.  No palpable cords. LYMPH NODES: No palpable cervical, supraclavicular, axillary or inguinal adenopathy  NEUROLOGICAL: Unremarkable. PSYCH:  Appropriate.   Admission on 11/22/2015, Discharged on 11/22/2015  Component Date Value Ref Range Status  . Glucose-Capillary 11/22/2015 98  65 - 99 mg/dL Final  Hospital Outpatient Visit on 11/21/2015  Component Date Value Ref Range Status  . Glucose-Capillary 11/21/2015 110* 65 - 99 mg/dL Final  Hospital Outpatient Visit on 11/21/2015  Component Date Value Ref Range Status  . Sodium 11/21/2015 139  135 - 145 mmol/L Final  .  Potassium 11/21/2015 3.8  3.5 - 5.1 mmol/L Final  . Chloride 11/21/2015 107  101 - 111 mmol/L Final  . CO2 11/21/2015 23  22 - 32 mmol/L Final  . Glucose, Bld 11/21/2015 116* 65 - 99 mg/dL Final  . BUN 11/21/2015 18  6 - 20 mg/dL Final  . Creatinine, Ser 11/21/2015 1.06* 0.44 - 1.00 mg/dL Final  . Calcium 11/21/2015 9.4  8.9 - 10.3 mg/dL Final  . GFR calc non Af Amer 11/21/2015 52* >60 mL/min Final  . GFR calc Af Amer 11/21/2015 60* >60 mL/min Final   Comment: (NOTE) The eGFR has been calculated using the CKD EPI equation. This calculation has not been validated in all clinical situations. eGFR's persistently <60 mL/min signify possible Chronic Kidney Disease.   . Anion gap 11/21/2015 9  5 - 15 Final  . WBC 11/21/2015  10.0  3.6 - 11.0 K/uL Final  . RBC 11/21/2015 4.88  3.80 - 5.20 MIL/uL Final  . Hemoglobin 11/21/2015 14.8  12.0 - 16.0 g/dL Final  . HCT 11/21/2015 45.5  35.0 - 47.0 % Final  . MCV 11/21/2015 93.1  80.0 - 100.0 fL Final  . MCH 11/21/2015 30.4  26.0 - 34.0 pg Final  . MCHC 11/21/2015 32.6  32.0 - 36.0 g/dL Final  . RDW 11/21/2015 13.4  11.5 - 14.5 % Final  . Platelets 11/21/2015 355  150 - 440 K/uL Final    Assessment:  TITIANA SEVERA is a 70 y.o. female with probable right lower lobe lung cancer.  Bronchoscopy on 09/22/2015 revealed mild to moderate chronic bronchitis changes with no endobronchial lesions.  Brushings from the right lower lobe were negative for malignancy.  Repeat bronchoscopy on 11/22/2015 was performed.  An EBUS scope was unable to be passed in the RML.  Transbronchial needle aspirations of a lesion were performed in the medial segment of the right middle lobe.  Pathology is pending.  PET scan on 11/21/2015 revealed findings suspicious for right lower lobe primary bronchogenic carcinoma.  There was a 2.9 x 3 cm central right lower lobe lung lesion (SUV 7.0). Areas of surrounding more patchy right lower lobe opacity were also suspicious for metastasis or synchronous primaries. There were bilateral pulmonary nodules, including hypermetabolic right upper and right lower lobe pulmonary nodules. These were suspicious for metastatic disease.  There was no thoracic nodal hypermetabolism.  There was an area of soft tissue fullness within the high left neck suspicious for an enlarged lateral retropharyngeal node (? skip nodal metastasis).  There was indeterminate right renal lesion.  Symptomatically, she feels fine.  Exam reveals a soft RLL rub.  Plan: 1.  Discuss likely diagnosed of lung cancer.  Discuss staging and management of lung cancer.  Discuss awaiting results from recent bronchoscopy. Concern for possible metastatic disease given retropharyngeal node.  Unknown if this lesion  can be biopsied.  Discuss reviewing with radiology and ENT. 2.  Present at tumor board on 11/23/2015. 3.  Labs today:  LFTs, CEA, LDH. 4.  Phone follow-up with patient tomorrow after tumor board. 5.  RTC in 1 week for MD assessment.  Addendum:  Preliminary from second brochoscopy may not yield a diagnosis.  CT guided biopsy of lung disease not possible.  Discussed referral to thoracic surgery for possible biopsy.  CT guided biopsy of retropharyngeal not possible.  Surgery would be required to obtain tissue from the retropharyngeal area (not recommended).   Lequita Asal, MD  11/23/2015, 10:11 AM

## 2015-11-24 ENCOUNTER — Other Ambulatory Visit: Payer: Self-pay | Admitting: Hematology and Oncology

## 2015-11-24 DIAGNOSIS — R918 Other nonspecific abnormal finding of lung field: Secondary | ICD-10-CM

## 2015-11-24 LAB — CEA: CEA: 2.6 ng/mL (ref 0.0–4.7)

## 2015-11-24 NOTE — Telephone Encounter (Signed)
We will decide after pathology results are available  Christina Sharp

## 2015-11-24 NOTE — Telephone Encounter (Signed)
Pt informed. Pt states [facility] is setting her up to see Dr. Genevive Bi. States she is waiting for that appointment. States call her when we know something. Nothing further needed.

## 2015-11-27 ENCOUNTER — Encounter: Payer: Self-pay | Admitting: Hematology and Oncology

## 2015-11-28 LAB — CYTOLOGY - NON PAP

## 2015-11-30 ENCOUNTER — Other Ambulatory Visit: Payer: Self-pay | Admitting: *Deleted

## 2015-11-30 ENCOUNTER — Inpatient Hospital Stay: Payer: BLUE CROSS/BLUE SHIELD | Attending: Hematology and Oncology | Admitting: Hematology and Oncology

## 2015-11-30 ENCOUNTER — Encounter: Payer: Self-pay | Admitting: Hematology and Oncology

## 2015-11-30 VITALS — BP 157/87 | HR 98 | Temp 97.6°F | Ht 64.0 in | Wt 155.2 lb

## 2015-11-30 DIAGNOSIS — N2889 Other specified disorders of kidney and ureter: Secondary | ICD-10-CM

## 2015-11-30 DIAGNOSIS — E7800 Pure hypercholesterolemia, unspecified: Secondary | ICD-10-CM

## 2015-11-30 DIAGNOSIS — Z85828 Personal history of other malignant neoplasm of skin: Secondary | ICD-10-CM | POA: Diagnosis not present

## 2015-11-30 DIAGNOSIS — Z79899 Other long term (current) drug therapy: Secondary | ICD-10-CM

## 2015-11-30 DIAGNOSIS — I10 Essential (primary) hypertension: Secondary | ICD-10-CM | POA: Diagnosis not present

## 2015-11-30 DIAGNOSIS — J449 Chronic obstructive pulmonary disease, unspecified: Secondary | ICD-10-CM

## 2015-11-30 DIAGNOSIS — Z7982 Long term (current) use of aspirin: Secondary | ICD-10-CM

## 2015-11-30 DIAGNOSIS — R918 Other nonspecific abnormal finding of lung field: Secondary | ICD-10-CM | POA: Diagnosis not present

## 2015-11-30 DIAGNOSIS — C770 Secondary and unspecified malignant neoplasm of lymph nodes of head, face and neck: Secondary | ICD-10-CM

## 2015-11-30 DIAGNOSIS — C3411 Malignant neoplasm of upper lobe, right bronchus or lung: Secondary | ICD-10-CM | POA: Insufficient documentation

## 2015-11-30 DIAGNOSIS — F17210 Nicotine dependence, cigarettes, uncomplicated: Secondary | ICD-10-CM | POA: Diagnosis not present

## 2015-11-30 NOTE — Progress Notes (Signed)
Patient here for follow up and results. No pain or discomfort. Appetite good sleep normal.

## 2015-11-30 NOTE — Progress Notes (Signed)
[facility] day:  11/30/2015  Chief Complaint: Christina Sharp is a 70 y.o. female with a right lung mass who is seen for 1 week assessment.  HPI: The patient was last seen in the medical oncology clinic on 10/24/2015.  At that time, she was sen for initial consultation.  She was felt to have a probable right lower lobe lung cancer.  Bronchoscopy on 09/22/2015 revealed no endobronchial lesions.  Brushing were negative for malignancy.  Repeat bronchoscopy on 11/22/2015 was performed.  Pathology was pending, but subsequently returned atypical cells (diagnostic tissue absent on block).  She was presented on tumor board on 11/24/2015.   CT guided biopsy was not possible.  We discussed referral to thoracic surgery for possible biopsy.  She was contacted.  We also discussed the PET positive retropharyngeal node.  CT guided biopsy was not possible.  Surgery would be required to obtain tissue from the retropharyngeal area (not recommended).  Symptomatically, she denies any new complaints.  Prior to clinic today, she was working.   Past Medical History:  Diagnosis Date  . Cancer ([place])    basal cell   . Emphysema of lung ([place])   . Hypercholesteremia   . Hypertension   . Renal disorder     Past Surgical History:  Procedure Laterality Date  . COLONOSCOPY    . ENDOBRONCHIAL ULTRASOUND N/A 11/22/2015   Procedure: ENDOBRONCHIAL ULTRASOUND;  Surgeon: Flora Lipps, MD;  Location: [facility] ORS;  Service: Cardiopulmonary;  Laterality: N/A;  . FLEXIBLE BRONCHOSCOPY N/A 09/22/2015   Procedure: FLEXIBLE BRONCHOSCOPY;  Surgeon: [name], MD;  Location: [facility] ORS;  Service: Pulmonary;  Laterality: N/A;    Family History  Problem Relation Age of Onset  . Alzheimer's disease Mother   . Heart disease Father     Social History:  reports that she quit smoking about 3 weeks ago. Her smoking use included Cigarettes. She has a 22.50 pack-year smoking history. She  has never used smokeless tobacco. She reports that she drinks alcohol. She reports that she does not use drugs.  She smoked 1 pack a day for 45-50 years.  She has not smoked in the past 16 days.  The patient is from [place].  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  She denies any exposure to radiation or toxins.  The patient is accompanied by a friend today.  Allergies:  Allergies  Allergen Reactions  . Augmentin [Amoxicillin-Pot Clavulanate] Hives  . Penicillins Hives    Current Medications: Current Outpatient Prescriptions  Medication Sig Dispense Refill  . amLODipine (NORVASC) 10 MG tablet Take 10 mg by mouth daily.    Marland Kitchen aspirin 81 MG tablet Take 81 mg by mouth daily.    Marland Kitchen atorvastatin (LIPITOR) 20 MG tablet Take 20 mg by mouth at bedtime.     . hydrALAZINE (APRESOLINE) 50 MG tablet Take 50 mg by mouth 3 (three) times daily.    . metoprolol succinate (TOPROL-XL) 100 MG 24 hr tablet Take 100 mg by mouth daily. Take with or immediately following a meal.    . sodium bicarbonate 650 MG tablet Take 1,300 mg by mouth 2 (two) times daily.      No current facility-administered medications for this visit.     Review of Systems:  GENERAL:  Feels "fine".  No fevers or sweats.  No weight loss. PERFORMANCE STATUS (ECOG):  0 HEENT:  No visual changes, runny nose, sore throat,  mouth sores or tenderness. Lungs:  No shortness of breath.  Rare cough.  No hemoptysis. Cardiac:  No chest pain, palpitations, orthopnea, or PND. GI:  No nausea, vomiting, diarrhea, constipation, melena or hematochezia.  Last colonoscopy > 10 years ago. GU:  No urgency, frequency, dysuria, or hematuria. Musculoskeletal:  No back pain.  No joint pain.  No muscle tenderness. Extremities:  No pain or swelling. Skin:  No rashes or skin changes. Neuro:  No headache, numbness or weakness, balance or coordination issues. Endocrine:  No diabetes, thyroid issues, hot flashes or night  sweats. Psych:  No mood changes, depression or anxiety. Pain:  No focal pain. Review of systems:  All other systems reviewed and found to be negative.  Physical Exam: Blood pressure (!) 157/87, pulse 98, temperature 97.6 F (36.4 C), temperature source Tympanic, height 5' 4"  (1.626 m), weight 155 lb 3.3 oz (70.4 kg). GENERAL:  Well developed, well nourished, woman sitting comfortably in the exam room in no acute distress. MENTAL STATUS:  Alert and oriented to person, place and time. HEAD:  Short gray hair.  Normocephalic, atraumatic, face symmetric, no Cushingoid features. EYES:  Brown eyes.  No conjunctivitis or scleral icterus.   No visits with results within 3 Day(s) from this visit.  Latest known visit with results is:  Appointment on 11/23/2015  Component Date Value Ref Range Status  . Total Protein 11/23/2015 8.0  6.5 - 8.1 g/dL Final  . Albumin 11/23/2015 4.4  3.5 - 5.0 g/dL Final  . AST 11/23/2015 23  15 - 41 U/L Final  . ALT 11/23/2015 18  14 - 54 U/L Final  . Alkaline Phosphatase 11/23/2015 83  38 - 126 U/L Final  . Total Bilirubin 11/23/2015 0.9  0.3 - 1.2 mg/dL Final  . Bilirubin, Direct 11/23/2015 0.1  0.1 - 0.5 mg/dL Final  . Indirect Bilirubin 11/23/2015 0.8  0.3 - 0.9 mg/dL Final  . LDH 11/23/2015 150  98 - 192 U/L Final  . CEA 11/24/2015 2.6  0.0 - 4.7 ng/mL Final   Comment: (NOTE)       Roche ECLIA methodology       Nonsmokers  <3.9                                     Smokers     <5.6 Performed At: [facility] [place], [place] [id number] Lindon Romp MD [id number]     Assessment:  Christina Sharp is a 70 y.o. female with probable right lower lobe lung cancer.  Bronchoscopy on 09/22/2015 revealed mild to moderate chronic bronchitis changes with no endobronchial lesions.  Brushings from the right lower lobe were negative for malignancy.  Repeat bronchoscopy on 11/22/2015 was performed.  An EBUS scope was unable to be passed in the  RML.  Transbronchial needle aspirations of a lesion were performed in the medial segment of the right middle lobe.  Pathology revealed atypical cells.  CEA and LDH were normal on 11/23/2015.  PET scan on 11/21/2015 revealed a 2.9 x 3 cm central right lower lobe lung lesion (SUV 7.0).  Areas of surrounding more patchy right lower lobe opacity were also suspicious for metastasis or synchronous primaries. There were bilateral pulmonary nodules, including hypermetabolic right upper and right lower lobe pulmonary nodules suspicious for metastatic disease.  There was no thoracic nodal hypermetabolism.  There was an area of soft tissue fullness  within the high left neck suspicious for an enlarged lateral retropharyngeal node (? skip nodal metastasis).  There was indeterminate right renal lesion.  Symptomatically, she feels fine.  Exam reveals a soft RLL rub.  Plan: 1.  Discuss tumor board discussion.  Discuss bronchoscopy results from 11/22/2015 (atypical cells).  Discuss plans for thoroscopic biopsy with Dr. Genevive Bi.   2.  Discuss inability to easily obtain tissue from paraesophageal area.  Unable to perform CT guided biopsy.  Open biopsy not recommended.  Discuss concern for possible metastatic disease.  Discuss evaluation with ENT if desired.   3.  Discuss lung cancer (small cell versus non-small cell) and treatment.  Discuss sending driver mutation studies (EGFR, ALK, ROS1, BRAF, PDL-1) for non-small cell cancer.  Discuss possible Foundation One testing. 4,  Schedule consult with thoracic surgery, Dr Genevive Bi. 5.  RTC after biopsy to discuss treatment options.   Lequita Asal, MD  11/30/2015, 9:31 AM

## 2015-12-02 ENCOUNTER — Ambulatory Visit (INDEPENDENT_AMBULATORY_CARE_PROVIDER_SITE_OTHER): Payer: BLUE CROSS/BLUE SHIELD | Admitting: Cardiothoracic Surgery

## 2015-12-02 ENCOUNTER — Encounter: Payer: Self-pay | Admitting: Cardiothoracic Surgery

## 2015-12-02 VITALS — BP 149/86 | HR 86 | Temp 97.8°F | Resp 18 | Ht 64.0 in | Wt 154.0 lb

## 2015-12-02 DIAGNOSIS — R918 Other nonspecific abnormal finding of lung field: Secondary | ICD-10-CM | POA: Diagnosis not present

## 2015-12-02 NOTE — Progress Notes (Signed)
Patient ID: Christina Sharp, female   DOB: 1945-09-06, 70 y.o.   MRN: [id number]  Chief Complaint  Patient presents with  . New Patient (Initial Visit)    Lung Mass-Needs Thoroscope Biopsy per Dr.Corcoran    Referred By Dr. Mike Gip  Reason for Referral Lung mass  HPI Location, Quality, Duration, Severity, Timing, Context, Modifying Factors, Associated Signs and Symptoms.  Christina Sharp is a 70 y.o. female.  Earlier this year she developed symptoms suggestive of a lung infection had a chest x-ray made which was abnormal. She was seen by multiple physicians where she was placed on antibiotics and all 20 underwent a bronchoscopy. This was performed to evaluate a right lower lobe superior segment pneumonic process. The bronchoscopy was unremarkable and she was again treated and a follow-up CT scan was performed. That CT scan showed an increase in the multiple bilateral pulmonary nodules which was most suggestive of a metastatic pattern. She again underwent bronchoscopy which revealed atypical cells only but no definite evidence of malignancy. The patient does have a long smoking history and she quit about a month ago. She presents here for consideration of lung biopsy. She has occasional wheezing but otherwise is asymptomatic.   Past Medical History:  Diagnosis Date  . Cancer ([place])    basal cell   . Emphysema of lung ([place])   . Hypercholesteremia   . Hypertension   . Renal disorder     Past Surgical History:  Procedure Laterality Date  . COLONOSCOPY    . ENDOBRONCHIAL ULTRASOUND N/A 11/22/2015   Procedure: ENDOBRONCHIAL ULTRASOUND;  Surgeon: Flora Lipps, MD;  Location: [facility] ORS;  Service: Cardiopulmonary;  Laterality: N/A;  . FLEXIBLE BRONCHOSCOPY N/A 09/22/2015   Procedure: FLEXIBLE BRONCHOSCOPY;  Surgeon: [name], MD;  Location: [facility] ORS;  Service: Pulmonary;  Laterality: N/A;    Family History  Problem Relation Age of Onset  . Alzheimer's disease Mother   . Heart  disease Father     Social History Social History  Substance Use Topics  . Smoking status: Former Smoker    Packs/day: 0.50    Years: 45.00    Types: Cigarettes    Quit date: 11/06/2015  . Smokeless tobacco: Never Used     Comment: hasn't smoked since 11-06-15  . Alcohol use Yes     Comment: occasional    Allergies  Allergen Reactions  . Augmentin [Amoxicillin-Pot Clavulanate] Hives  . Penicillins Hives    Current Outpatient Prescriptions  Medication Sig Dispense Refill  . amLODipine (NORVASC) 10 MG tablet Take 10 mg by mouth daily.    Marland Kitchen aspirin 81 MG tablet Take 81 mg by mouth daily.    Marland Kitchen atorvastatin (LIPITOR) 20 MG tablet Take 20 mg by mouth at bedtime.     . hydrALAZINE (APRESOLINE) 50 MG tablet Take 50 mg by mouth 3 (three) times daily.    . metoprolol succinate (TOPROL-XL) 100 MG 24 hr tablet Take 100 mg by mouth daily. Take with or immediately following a meal.    . sodium bicarbonate 650 MG tablet Take 1,300 mg by mouth 2 (two) times daily.      No current facility-administered medications for this visit.       Review of Systems A complete review of systems was asked and was negative except for the following positive findingsWheezing, easy bruising.  Blood pressure (!) 149/86, pulse 86, temperature 97.8 F (36.6 C), temperature source Oral, resp. rate 18, height '5\' 4"'$  (1.626 m), weight 154 lb (69.9  kg), SpO2 97 %.  Physical Exam CONSTITUTIONAL:  Pleasant, well-developed, well-nourished, and in no acute distress. EYES: Pupils equal and reactive to light, Sclera non-icteric EARS, NOSE, MOUTH AND THROAT:  The oropharynx was clear.  Dentition is good repair.  Oral mucosa pink and moist. LYMPH NODES:  Lymph nodes in the neck and axillae were normal RESPIRATORY:  Lungs were clear.  Normal respiratory effort without pathologic use of accessory muscles of respiration CARDIOVASCULAR: Heart was regular without murmurs.  There were no carotid bruits. GI: The abdomen was  soft, nontender, and nondistended. There were no palpable masses. There was no hepatosplenomegaly. There were normal bowel sounds in all quadrants. GU:  Rectal deferred.   MUSCULOSKELETAL:  Normal muscle strength and tone.  No clubbing or cyanosis.   SKIN:  There were no pathologic skin lesions.  There were no nodules on palpation. NEUROLOGIC:  Sensation is normal.  Cranial nerves are grossly intact. PSYCH:  Oriented to person, place and time.  Mood and affect are normal.  Data Reviewed CT scans  I have personally reviewed the patient's imaging, laboratory findings and medical records.    Assessment    Progressive disease within the right and left lungs. I believe that this is most consistent with metastatic process.    Plan    I reviewed with the patient the indications and risks of right thoracoscopy possible thoracotomy with lung biopsy and possible Port-A-Cath placement. I discussed with her the possible techniques to obtain a biopsy including lung biopsy. Risks of bleeding, infection, air leak and death were all reviewed. We also discussed the postoperative management as well as the hospitalization and long-term follow-up. She understands that findings at the time of surgery may dictate her ultimate treatment and work status. She would like [place] to proceed.       Nestor Lewandowsky, MD 12/02/2015, 10:52 AM

## 2015-12-02 NOTE — Patient Instructions (Addendum)
We have your surgery scheduled for 12/13/15 with Dr.Oaks.  Please see your [facility] pre-care sheet for surgery information. Please call our office if you have any questions or concerns.

## 2015-12-05 ENCOUNTER — Telehealth: Payer: Self-pay | Admitting: Cardiothoracic Surgery

## 2015-12-05 NOTE — Telephone Encounter (Signed)
Pt advised of pre op date/time and sx date. Sx: 12/14/15 with Dr Genevive Bi with Adonis Huguenin assisting--Right thoracoscopy, possible thoracotomy with lung biopsy.  Pre op: 12/09/15 telephone interview. Patient instructed to come in Monday morning (12/12/15) to have all labs and chest x ray done.  Patient made aware to call [phone], between 1-3:00pm the day before surgery, to find out what time to arrive.

## 2015-12-09 ENCOUNTER — Encounter
Admission: RE | Admit: 2015-12-09 | Discharge: 2015-12-09 | Disposition: A | Discharge status: Home / Self Care | Payer: BLUE CROSS/BLUE SHIELD | Source: Ambulatory Visit | Attending: Cardiothoracic Surgery | Admitting: Cardiothoracic Surgery

## 2015-12-09 DIAGNOSIS — R918 Other nonspecific abnormal finding of lung field: Secondary | ICD-10-CM | POA: Diagnosis not present

## 2015-12-09 NOTE — Patient Instructions (Signed)
  Your procedure is scheduled on: December 14, 2015 (Wednesday) Report to Same Day Surgery 2nd floor Medical  [facility] To find out your arrival time please call [phone] between 1PM - 3PM on December 13, 2015 (Tuesday)  Remember: Instructions that are not followed completely may result in serious medical risk, up to and including death, or upon the discretion of your surgeon and anesthesiologist your surgery may need to be rescheduled.    _x___ 1. Do not eat food or drink liquids after midnight. No gum chewing or hard candies.     __x__ 2. No Alcohol for 24 hours before or after surgery.   __x__3. No Smoking for 24 prior to surgery.   ____  4. Bring all medications with you on the day of surgery if instructed.    __x__ 5. Notify your doctor if there is any change in your medical condition     (cold, fever, infections).     Do not wear jewelry, make-up, hairpins, clips or nail polish.  Do not wear lotions, powders, or perfumes. You may wear deodorant.  Do not shave 48 hours prior to surgery. Men may shave face and neck.  Do not bring valuables to the hospital.    [facility] is not responsible for any belongings or valuables.               Contacts, dentures or bridgework may not be worn into surgery.  Leave your suitcase in the car. After surgery it may be brought to your room.  For patients admitted to the hospital, discharge time is determined by your treatment team.   Patients discharged the day of surgery will not be allowed to drive home.    Please read over the following fact sheets that you were given:   [facility] Preparing for Surgery and or MRSA Information   _x___ Take these medicines the morning of surgery with A SIP OF WATER:    1. Amlodipine  2. HydrAlazine  3. Metoprolol  4.  5.  6.  ____Fleets enema or Magnesium Citrate as directed.   __ Use CHG Soap or sage wipes as directed on instruction sheet   ____ Use inhalers on the day of surgery and bring to  hospital day of surgery  ____ Stop metformin 2 days prior to surgery    ____ Take 1/2 of usual insulin dose the night before surgery and none on the morning of           surgery.   __x__ Stop aspirin or coumadin, or plavix (Patient stopped Aspirin one week ago)  x__ Stop Anti-inflammatories such as Advil, Aleve, Ibuprofen, Motrin, Naproxen,          Naprosyn, Goodies powders or aspirin products. Ok to take Tylenol.   ____ Stop supplements until after surgery.    ____ Bring C-Pap to the hospital.

## 2015-12-12 ENCOUNTER — Encounter
Admission: RE | Admit: 2015-12-12 | Discharge: 2015-12-12 | Disposition: A | Discharge status: Home / Self Care | Payer: BLUE CROSS/BLUE SHIELD | Source: Ambulatory Visit | Attending: Cardiothoracic Surgery | Admitting: Cardiothoracic Surgery

## 2015-12-12 ENCOUNTER — Ambulatory Visit
Admission: RE | Admit: 2015-12-12 | Discharge: 2015-12-12 | Disposition: A | Discharge status: Home / Self Care | Payer: BLUE CROSS/BLUE SHIELD | Source: Ambulatory Visit | Attending: Cardiothoracic Surgery | Admitting: Cardiothoracic Surgery

## 2015-12-12 DIAGNOSIS — R918 Other nonspecific abnormal finding of lung field: Secondary | ICD-10-CM | POA: Diagnosis not present

## 2015-12-12 LAB — COMPREHENSIVE METABOLIC PANEL
ALBUMIN: 3.8 g/dL (ref 3.5–5.0)
ALK PHOS: 97 U/L (ref 38–126)
ALT: 15 U/L (ref 14–54)
AST: 20 U/L (ref 15–41)
Anion gap: 8 (ref 5–15)
BILIRUBIN TOTAL: 0.7 mg/dL (ref 0.3–1.2)
BUN: 15 mg/dL (ref 6–20)
CALCIUM: 9.3 mg/dL (ref 8.9–10.3)
CO2: 23 mmol/L (ref 22–32)
Chloride: 107 mmol/L (ref 101–111)
Creatinine, Ser: 0.97 mg/dL (ref 0.44–1.00)
GFR calc Af Amer: >60 mL/min (ref >60)
GFR calc non Af Amer: 57 mL/min — ABNORMAL LOW (ref >60)
GLUCOSE: 115 mg/dL — AB (ref 65–99)
POTASSIUM: 3.6 mmol/L (ref 3.5–5.1)
SODIUM: 138 mmol/L (ref 135–145)
TOTAL PROTEIN: 7.3 g/dL (ref 6.5–8.1)

## 2015-12-12 LAB — CBC
HEMATOCRIT: 44.5 % (ref 35.0–47.0)
HEMOGLOBIN: 14.7 g/dL (ref 12.0–16.0)
MCH: 30.2 pg (ref 26.0–34.0)
MCHC: 32.9 g/dL (ref 32.0–36.0)
MCV: 91.5 fL (ref 80.0–100.0)
Platelets: 404 10*3/uL (ref 150–440)
RBC: 4.86 MIL/uL (ref 3.80–5.20)
RDW: 13.1 % (ref 11.5–14.5)
WBC: 10.5 10*3/uL (ref 3.6–11.0)

## 2015-12-12 LAB — PROTIME-INR
INR: 0.85
Prothrombin Time: 11.6 seconds (ref 11.4–15.2)

## 2015-12-12 LAB — TYPE AND SCREEN
ABO/RH(D): O NEG
Antibody Screen: NEGATIVE

## 2015-12-12 LAB — APTT: APTT: 28 s (ref 24–36)

## 2015-12-12 LAB — SURGICAL PCR SCREEN
MRSA, PCR: NEGATIVE
Staphylococcus aureus: NEGATIVE

## 2015-12-13 MED ORDER — VANCOMYCIN HCL IN DEXTROSE 1-5 GM/200ML-% IV SOLN
1000.0000 mg | INTRAVENOUS | Status: DC
Start: 2015-12-14 — End: 2015-12-14

## 2015-12-14 ENCOUNTER — Inpatient Hospital Stay: Payer: BLUE CROSS/BLUE SHIELD | Admitting: Certified Registered Nurse Anesthetist

## 2015-12-14 ENCOUNTER — Inpatient Hospital Stay
Admission: RE | Admit: 2015-12-14 | Discharge: 2015-12-17 | DRG: 165 | Disposition: A | Discharge status: Home / Self Care | Payer: BLUE CROSS/BLUE SHIELD | Source: Ambulatory Visit | Attending: Cardiothoracic Surgery | Admitting: Cardiothoracic Surgery

## 2015-12-14 ENCOUNTER — Inpatient Hospital Stay: Payer: BLUE CROSS/BLUE SHIELD

## 2015-12-14 ENCOUNTER — Ambulatory Visit: Payer: BLUE CROSS/BLUE SHIELD | Admitting: Hematology and Oncology

## 2015-12-14 ENCOUNTER — Encounter: Payer: Self-pay | Admitting: *Deleted

## 2015-12-14 ENCOUNTER — Encounter
Admission: RE | Disposition: A | Discharge status: Home / Self Care | Payer: Self-pay | Source: Ambulatory Visit | Attending: Cardiothoracic Surgery

## 2015-12-14 DIAGNOSIS — C342 Malignant neoplasm of middle lobe, bronchus or lung: Secondary | ICD-10-CM | POA: Diagnosis not present

## 2015-12-14 DIAGNOSIS — Z09 Encounter for follow-up examination after completed treatment for conditions other than malignant neoplasm: Secondary | ICD-10-CM

## 2015-12-14 DIAGNOSIS — Z8249 Family history of ischemic heart disease and other diseases of the circulatory system: Secondary | ICD-10-CM | POA: Diagnosis not present

## 2015-12-14 DIAGNOSIS — R222 Localized swelling, mass and lump, trunk: Secondary | ICD-10-CM | POA: Diagnosis not present

## 2015-12-14 DIAGNOSIS — C3490 Malignant neoplasm of unspecified part of unspecified bronchus or lung: Secondary | ICD-10-CM | POA: Diagnosis present

## 2015-12-14 DIAGNOSIS — I10 Essential (primary) hypertension: Secondary | ICD-10-CM | POA: Diagnosis present

## 2015-12-14 DIAGNOSIS — E7800 Pure hypercholesterolemia, unspecified: Secondary | ICD-10-CM | POA: Diagnosis present

## 2015-12-14 DIAGNOSIS — Z7982 Long term (current) use of aspirin: Secondary | ICD-10-CM | POA: Diagnosis not present

## 2015-12-14 DIAGNOSIS — Z880 Allergy status to penicillin: Secondary | ICD-10-CM | POA: Diagnosis not present

## 2015-12-14 DIAGNOSIS — J439 Emphysema, unspecified: Secondary | ICD-10-CM | POA: Diagnosis present

## 2015-12-14 DIAGNOSIS — R918 Other nonspecific abnormal finding of lung field: Secondary | ICD-10-CM | POA: Diagnosis not present

## 2015-12-14 DIAGNOSIS — C3431 Malignant neoplasm of lower lobe, right bronchus or lung: Principal | ICD-10-CM | POA: Diagnosis present

## 2015-12-14 DIAGNOSIS — Z79899 Other long term (current) drug therapy: Secondary | ICD-10-CM

## 2015-12-14 DIAGNOSIS — R911 Solitary pulmonary nodule: Secondary | ICD-10-CM | POA: Diagnosis not present

## 2015-12-14 DIAGNOSIS — Z87891 Personal history of nicotine dependence: Secondary | ICD-10-CM

## 2015-12-14 DIAGNOSIS — R0602 Shortness of breath: Secondary | ICD-10-CM

## 2015-12-14 HISTORY — PX: VIDEO ASSISTED THORACOSCOPY (VATS)/THOROCOTOMY: SHX6173

## 2015-12-14 LAB — ABO/RH: ABO/RH(D): O NEG

## 2015-12-14 SURGERY — VIDEO ASSISTED THORACOSCOPY (VATS)/THOROCOTOMY
Anesthesia: General | Laterality: Right | Wound class: Clean Contaminated

## 2015-12-14 MED ORDER — LIDOCAINE HCL (CARDIAC) 20 MG/ML IV SOLN
INTRAVENOUS | Status: DC | PRN
  Administered 2015-12-14: 40 mg via INTRAVENOUS

## 2015-12-14 MED ORDER — PHENYLEPHRINE HCL 10 MG/ML IJ SOLN
INTRAMUSCULAR | Status: DC | PRN
  Administered 2015-12-14: 50 ug via INTRAVENOUS

## 2015-12-14 MED ORDER — KETOROLAC TROMETHAMINE 30 MG/ML IJ SOLN
INTRAMUSCULAR | Status: DC | PRN
  Administered 2015-12-14: 15 mg via INTRAVENOUS

## 2015-12-14 MED ORDER — FENTANYL CITRATE (PF) 100 MCG/2ML IJ SOLN
25.0000 ug | INTRAMUSCULAR | Status: DC | PRN
  Administered 2015-12-14 (×4): 25 ug via INTRAVENOUS

## 2015-12-14 MED ORDER — FENTANYL CITRATE (PF) 100 MCG/2ML IJ SOLN
INTRAMUSCULAR | Status: AC
  Administered 2015-12-14: 25 ug via INTRAVENOUS
  Filled 2015-12-14: qty 2

## 2015-12-14 MED ORDER — BUPIVACAINE LIPOSOME 1.3 % IJ SUSP
INTRAMUSCULAR | Status: AC
  Filled 2015-12-14: qty 20

## 2015-12-14 MED ORDER — AMLODIPINE BESYLATE 10 MG PO TABS
10.0000 mg | ORAL_TABLET | Freq: Every day | ORAL | Status: DC
  Administered 2015-12-15 – 2015-12-17 (×3): 10 mg via ORAL
  Filled 2015-12-14 (×3): qty 1

## 2015-12-14 MED ORDER — BISACODYL 5 MG PO TBEC
10.0000 mg | DELAYED_RELEASE_TABLET | Freq: Every day | ORAL | Status: DC
  Administered 2015-12-15 – 2015-12-17 (×3): 10 mg via ORAL
  Filled 2015-12-14 (×4): qty 2

## 2015-12-14 MED ORDER — ONDANSETRON HCL 4 MG/2ML IJ SOLN
INTRAMUSCULAR | Status: DC | PRN
  Administered 2015-12-14: 4 mg via INTRAVENOUS

## 2015-12-14 MED ORDER — ACETAMINOPHEN 10 MG/ML IV SOLN
INTRAVENOUS | Status: DC | PRN
  Administered 2015-12-14: 1000 mg via INTRAVENOUS

## 2015-12-14 MED ORDER — SODIUM CHLORIDE 0.9 % IV SOLN
INTRAVENOUS | Status: DC | PRN
  Administered 2015-12-14: 11:00:00 via INTRAVENOUS

## 2015-12-14 MED ORDER — MEPERIDINE HCL 25 MG/ML IJ SOLN: 6.2500 mg | INTRAMUSCULAR | Status: DC | PRN

## 2015-12-14 MED ORDER — FAMOTIDINE 20 MG PO TABS
ORAL_TABLET | ORAL | Status: AC
  Administered 2015-12-14: 20 mg via ORAL
  Filled 2015-12-14: qty 1

## 2015-12-14 MED ORDER — MINERAL OIL LIGHT 100 % EX OIL
TOPICAL_OIL | CUTANEOUS | Status: AC
  Filled 2015-12-14: qty 25

## 2015-12-14 MED ORDER — METOPROLOL SUCCINATE ER 100 MG PO TB24
100.0000 mg | ORAL_TABLET | Freq: Every day | ORAL | Status: DC
  Administered 2015-12-15 – 2015-12-17 (×3): 100 mg via ORAL
  Filled 2015-12-14 (×3): qty 1

## 2015-12-14 MED ORDER — SUGAMMADEX SODIUM 200 MG/2ML IV SOLN
INTRAVENOUS | Status: DC | PRN
  Administered 2015-12-14: 150 mg via INTRAVENOUS

## 2015-12-14 MED ORDER — MIDAZOLAM HCL 2 MG/2ML IJ SOLN
INTRAMUSCULAR | Status: DC | PRN
  Administered 2015-12-14: 2 mg via INTRAVENOUS

## 2015-12-14 MED ORDER — ONDANSETRON HCL 4 MG/2ML IJ SOLN: 4.0000 mg | Freq: Four times a day (QID) | INTRAMUSCULAR | Status: DC | PRN

## 2015-12-14 MED ORDER — PROMETHAZINE HCL 25 MG/ML IJ SOLN: 6.2500 mg | INTRAMUSCULAR | Status: DC | PRN

## 2015-12-14 MED ORDER — VANCOMYCIN HCL 1000 MG IV SOLR
INTRAVENOUS | Status: DC | PRN
  Administered 2015-12-14: 1000 mg via INTRAVENOUS

## 2015-12-14 MED ORDER — ALBUTEROL SULFATE (2.5 MG/3ML) 0.083% IN NEBU: 2.5000 mg | INHALATION_SOLUTION | RESPIRATORY_TRACT | Status: DC

## 2015-12-14 MED ORDER — FENTANYL CITRATE (PF) 100 MCG/2ML IJ SOLN
INTRAMUSCULAR | Status: DC | PRN
  Administered 2015-12-14 (×3): 50 ug via INTRAVENOUS

## 2015-12-14 MED ORDER — PROPOFOL 10 MG/ML IV BOLUS
INTRAVENOUS | Status: DC | PRN
  Administered 2015-12-14: 150 mg via INTRAVENOUS

## 2015-12-14 MED ORDER — TRAMADOL HCL 50 MG PO TABS
50.0000 mg | ORAL_TABLET | Freq: Four times a day (QID) | ORAL | Status: DC
  Administered 2015-12-14 – 2015-12-17 (×11): 50 mg via ORAL
  Filled 2015-12-14 (×12): qty 1

## 2015-12-14 MED ORDER — ALBUTEROL SULFATE (2.5 MG/3ML) 0.083% IN NEBU
2.5000 mg | INHALATION_SOLUTION | RESPIRATORY_TRACT | Status: DC
  Administered 2015-12-14 – 2015-12-15 (×4): 2.5 mg via RESPIRATORY_TRACT
  Filled 2015-12-14 (×4): qty 3

## 2015-12-14 MED ORDER — ASPIRIN EC 81 MG PO TBEC
81.0000 mg | DELAYED_RELEASE_TABLET | Freq: Every day | ORAL | Status: DC
  Administered 2015-12-15 – 2015-12-17 (×3): 81 mg via ORAL
  Filled 2015-12-14 (×3): qty 1

## 2015-12-14 MED ORDER — SODIUM BICARBONATE 650 MG PO TABS
1300.0000 mg | ORAL_TABLET | Freq: Two times a day (BID) | ORAL | Status: DC
  Administered 2015-12-14 – 2015-12-17 (×6): 1300 mg via ORAL
  Filled 2015-12-14 (×6): qty 2

## 2015-12-14 MED ORDER — FAMOTIDINE 20 MG PO TABS
20.0000 mg | ORAL_TABLET | Freq: Once | ORAL | Status: AC
  Administered 2015-12-14: 20 mg via ORAL

## 2015-12-14 MED ORDER — MORPHINE SULFATE (PF) 4 MG/ML IV SOLN: 2.0000 mg | INTRAVENOUS | Status: DC | PRN

## 2015-12-14 MED ORDER — ROCURONIUM BROMIDE 100 MG/10ML IV SOLN
INTRAVENOUS | Status: DC | PRN
  Administered 2015-12-14: 10 mg via INTRAVENOUS
  Administered 2015-12-14: 40 mg via INTRAVENOUS
  Administered 2015-12-14: 5 mg via INTRAVENOUS

## 2015-12-14 MED ORDER — DEXTROSE-NACL 5-0.45 % IV SOLN
INTRAVENOUS | Status: DC
  Administered 2015-12-14 – 2015-12-15 (×2): via INTRAVENOUS

## 2015-12-14 MED ORDER — VANCOMYCIN HCL IN DEXTROSE 1-5 GM/200ML-% IV SOLN
INTRAVENOUS | Status: AC
  Filled 2015-12-14: qty 200

## 2015-12-14 MED ORDER — ATORVASTATIN CALCIUM 20 MG PO TABS
20.0000 mg | ORAL_TABLET | Freq: Every day | ORAL | Status: DC
  Administered 2015-12-14 – 2015-12-16 (×3): 20 mg via ORAL
  Filled 2015-12-14 (×3): qty 1

## 2015-12-14 MED ORDER — SODIUM CHLORIDE 0.9 % IJ SOLN
INTRAMUSCULAR | Status: AC
  Filled 2015-12-14: qty 50

## 2015-12-14 MED ORDER — OXYCODONE HCL 5 MG PO TABS: 5.0000 mg | ORAL_TABLET | Freq: Once | ORAL | Status: DC | PRN

## 2015-12-14 MED ORDER — OXYCODONE HCL 5 MG/5ML PO SOLN: 5.0000 mg | Freq: Once | ORAL | Status: DC | PRN

## 2015-12-14 MED ORDER — VANCOMYCIN HCL IN DEXTROSE 750-5 MG/150ML-% IV SOLN
750.0000 mg | Freq: Two times a day (BID) | INTRAVENOUS | Status: AC
Start: 2015-12-14 — End: 2015-12-15
  Administered 2015-12-14 – 2015-12-15 (×2): 750 mg via INTRAVENOUS
  Filled 2015-12-14 (×2): qty 150

## 2015-12-14 MED ORDER — SODIUM CHLORIDE 0.9 % IV SOLN
INTRAVENOUS | Status: DC | PRN
  Administered 2015-12-14: 50 mL

## 2015-12-14 MED ORDER — LACTATED RINGERS IV SOLN
INTRAVENOUS | Status: DC
  Administered 2015-12-14: 08:00:00 via INTRAVENOUS

## 2015-12-14 MED ORDER — ACETAMINOPHEN 10 MG/ML IV SOLN
INTRAVENOUS | Status: AC
  Filled 2015-12-14: qty 100

## 2015-12-14 MED ORDER — EPHEDRINE SULFATE 50 MG/ML IJ SOLN
INTRAMUSCULAR | Status: DC | PRN
  Administered 2015-12-14: 5 mg via INTRAVENOUS

## 2015-12-14 SURGICAL SUPPLY — 61 items
BENZOIN TINCTURE PRP APPL 2/3 (GAUZE/BANDAGES/DRESSINGS) ×2 IMPLANT
BNDG COHESIVE 4X5 TAN STRL (GAUZE/BANDAGES/DRESSINGS) IMPLANT
BRONCHOSCOPE PED SLIM DISP (MISCELLANEOUS) ×2 IMPLANT
CANISTER SUCT 1200ML W/VALVE (MISCELLANEOUS) ×2 IMPLANT
CATH THOR STR 28F  SOFT WA (CATHETERS) ×1
CATH THOR STR 28F SOFT WA (CATHETERS) ×1 IMPLANT
CATH TRAY 16F METER LATEX (MISCELLANEOUS) ×2 IMPLANT
CATH URET ROBINSON 16FR STRL (CATHETERS) IMPLANT
CHLORAPREP W/TINT 26ML (MISCELLANEOUS) ×4 IMPLANT
CNTNR SPEC 2.5X3XGRAD LEK (MISCELLANEOUS) ×4
CONT SPEC 4OZ STER OR WHT (MISCELLANEOUS) ×4
CONTAINER SPEC 2.5X3XGRAD LEK (MISCELLANEOUS) ×4 IMPLANT
CUTTER ECHEON FLEX ENDO 45 340 (ENDOMECHANICALS) ×2 IMPLANT
DEFOGGER SCOPE WARMER CLEARIFY (MISCELLANEOUS) ×2 IMPLANT
DRAIN CHEST DRY SUCT SGL (MISCELLANEOUS) ×2 IMPLANT
DRAPE C-SECTION (MISCELLANEOUS) ×2 IMPLANT
DRAPE MAG INST 16X20 L/F (DRAPES) ×2 IMPLANT
DRSG OPSITE POSTOP 4X6 (GAUZE/BANDAGES/DRESSINGS) ×2 IMPLANT
DRSG OPSITE POSTOP 4X8 (GAUZE/BANDAGES/DRESSINGS) ×2 IMPLANT
DRSG TELFA 3X8 NADH (GAUZE/BANDAGES/DRESSINGS) ×2 IMPLANT
ELECT BLADE 6.5 EXT (BLADE) ×2 IMPLANT
ELECT CAUTERY BLADE TIP 2.5 (TIP) ×2
ELECT REM PT RETURN 9FT ADLT (ELECTROSURGICAL) ×2
ELECTRODE CAUTERY BLDE TIP 2.5 (TIP) ×1 IMPLANT
ELECTRODE REM PT RTRN 9FT ADLT (ELECTROSURGICAL) ×1 IMPLANT
GAUZE SPONGE 4X4 12PLY STRL (GAUZE/BANDAGES/DRESSINGS) ×2 IMPLANT
GLOVE SURG SYN 7.5  E (GLOVE) ×6
GLOVE SURG SYN 7.5 E (GLOVE) ×6 IMPLANT
GOWN STRL REUS W/ TWL LRG LVL3 (GOWN DISPOSABLE) ×4 IMPLANT
GOWN STRL REUS W/TWL LRG LVL3 (GOWN DISPOSABLE) ×4
KIT RM TURNOVER STRD PROC AR (KITS) ×2 IMPLANT
LABEL OR SOLS (LABEL) ×2 IMPLANT
LOOP RED MAXI  1X406MM (MISCELLANEOUS) ×1
LOOP VESSEL MAXI 1X406 RED (MISCELLANEOUS) ×1 IMPLANT
MARKER SKIN DUAL TIP RULER LAB (MISCELLANEOUS) ×2 IMPLANT
PACK BASIN MAJOR ARMC (MISCELLANEOUS) ×2 IMPLANT
RELOAD GOLD ECHELON 45 (STAPLE) ×6 IMPLANT
RELOAD STAPLER LINE PROX 30 GR (STAPLE) IMPLANT
SPONGE KITTNER 5P (MISCELLANEOUS) ×2 IMPLANT
STAPLER RELOAD LINE PROX 30 GR (STAPLE)
STAPLER SKIN PROX 35W (STAPLE) ×2 IMPLANT
STAPLER VASCULAR ECHELON 35 (CUTTER) IMPLANT
STRIP CLOSURE SKIN 1/2X4 (GAUZE/BANDAGES/DRESSINGS) ×2 IMPLANT
SUT MNCRL AB 3-0 PS2 27 (SUTURE) ×4 IMPLANT
SUT PROLENE 5 0 RB 1 DA (SUTURE) IMPLANT
SUT SILK 0 (SUTURE)
SUT SILK 0 30XBRD TIE 6 (SUTURE) IMPLANT
SUT SILK 1 SH (SUTURE) ×14 IMPLANT
SUT VIC AB 0 CT1 36 (SUTURE) ×6 IMPLANT
SUT VIC AB 2-0 CT1 27 (SUTURE) ×1
SUT VIC AB 2-0 CT1 TAPERPNT 27 (SUTURE) ×1 IMPLANT
SUT VICRYL 2 TP 1 (SUTURE) ×2 IMPLANT
SYR 10ML SLIP (SYRINGE) ×2 IMPLANT
SYR BULB IRRIG 60ML STRL (SYRINGE) ×2 IMPLANT
TAPE ADH 3 LX (MISCELLANEOUS) ×2 IMPLANT
TAPE TRANSPORE STRL 2 31045 (GAUZE/BANDAGES/DRESSINGS) IMPLANT
TROCAR FLEXIPATH 20X80 (ENDOMECHANICALS) ×2 IMPLANT
TROCAR FLEXIPATH THORACIC 15MM (ENDOMECHANICALS) ×4 IMPLANT
TUBING CONNECTING 10 (TUBING) ×2 IMPLANT
WATER STERILE IRR 1000ML POUR (IV SOLUTION) ×4 IMPLANT
YANKAUER SUCT BULB TIP FLEX NO (MISCELLANEOUS) ×2 IMPLANT

## 2015-12-14 NOTE — Op Note (Signed)
  12/14/2015  1:56 PM  PATIENT:  Christina Sharp  70 y.o. female  PRE-OPERATIVE DIAGNOSIS:  Lung Mass  POST-OPERATIVE DIAGNOSIS:  Adenocarcinoma of lung   PROCEDURE:  Preoperative bronchoscopy to assess the endobronchial anatomy with right thoracoscopy and wedge resection right middle lobe mass and pleural biopsy  SURGEON:  Surgeon(s) and Role:    * Nestor Lewandowsky, MD - Primary  ASSISTANTS: Dr. Clayburn Pert   ANESTHESIA: General  INDICATIONS FOR PROCEDURE this patient is a 70 year old white female who has undergone bronchoscopy in the past for a right lower lobe mass. That bronchoscopy was nondiagnostic and she was offered the above-named procedure to make a definitive diagnosis. Indications and risks of procedure were explained the patient gave her informed consent.  DICTATION: Patient is brought to the operating suite and placed in supine position. General endotracheal anesthesia was given through a double-lumen tube. Preoperative bronchoscopy was carried out. The left side was normal. On the right side we could see that the right basilar segments in the right superior segment of the lower lobe are both edematous. It is difficult to get the scope into the superior segment bronchus. There is no true endobronchial mass but more extrinsic compression with heaped up mucosa. The tube was positioned appropriately and the patient was then turned for a right thoracoscopy.  The patient was prepped and draped in usual sterile fashion. All pressure points were carefully padded. 3 incisions were made on the chest wall each approximately 1.5 cm. These were positioned in a triangular fashion on the lower aspect of the right hemithorax. With these 3 incisions we had a good look at the inferior aspects of the lower lobes. Within the right lower lobe there is an infarcted area measuring about 2 cm at the very edge just at the level of the diaphragm. On the undersurface of the right middle lobe there is a  palpable mass. This was grasped with a lung clamp and then an endoscopic stapler was used to excise this mass. It measured about 1 cm in overall depth. This was sent for frozen section was consistent with adenocarcinoma. A single chest tube was then placed to the apex of the right hemithorax after the chest was irrigated. We then took some random pleural biopsies. These were sent for permanent section. The lung was then inflated.  The chest tube was secured to the chest wall with #1 silk sutures. The 3 thoracoscopy ports were closed with 0 Vicryl on the muscle 3-0 Vicryl in the subcutaneous tissues and 3-0 nylon on the skin. Sterile dressings were applied. Patient was enrolled in the supine position where she was extubated and taken to the recovery room in stable condition.   Nestor Lewandowsky, MD

## 2015-12-14 NOTE — Transfer of Care (Signed)
Immediate Anesthesia Transfer of Care Note  Patient: Christina Sharp  Procedure(s) Performed: Procedure(s): VIDEO ASSISTED THORACOSCOPY (VATS)/THOROCOTOMY (Right)  Patient Location: PACU  Anesthesia Type:General  Level of Consciousness: awake, alert  and oriented  Airway & Oxygen Therapy: Patient Spontanous Breathing and Patient connected to face mask oxygen  Post-op Assessment: Report given to RN and Post -op Vital signs reviewed and stable  Post vital signs: Reviewed and stable  Last Vitals:  Vitals:   12/14/15 0800  BP: 139/89  Pulse: 86  Resp: 16  Temp: 36.6 C    Last Pain:  Vitals:   12/14/15 0800  TempSrc: Oral  PainSc: 0-No pain         Complications: No apparent anesthesia complications

## 2015-12-14 NOTE — Anesthesia Preprocedure Evaluation (Signed)
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 on medications (-) CAD and (-) Past MI  Rhythm:Regular Rate:Normal - Systolic murmurs and - Diastolic murmurs    Neuro/Psych negative psych ROS   GI/Hepatic negative GI ROS, Neg liver ROS,   Endo/Other  negative endocrine ROSneg diabetes  Renal/GU CRFRenal disease     Musculoskeletal negative musculoskeletal ROS (+)   Abdominal (+) - obese,   Peds  Hematology negative hematology ROS (+)   Anesthesia Other Findings Past Medical History: No date: Cancer ([place])     Comment: basal cell  No date: Hypercholesteremia No date: Hypertension No date: Renal disorder   Reproductive/Obstetrics negative OB ROS                            Anesthesia Physical  Anesthesia Plan  ASA: II  Anesthesia Plan: General   Post-op Pain Management:    Induction: Intravenous  Airway Management Planned: Oral ETT  Additional Equipment:   Intra-op Plan:   Post-operative Plan: Extubation in OR  Informed Consent: I have reviewed the patients History and Physical, chart, labs and discussed the procedure including the risks, benefits and alternatives for the proposed anesthesia with the patient or authorized representative who has indicated his/her understanding and acceptance.   Dental advisory given  Plan Discussed with: Anesthesiologist and  CRNA  Anesthesia Plan Comments:         Lab Results  Component Value Date   WBC 10.5 12/12/2015   HGB 14.7 12/12/2015   HCT 44.5 12/12/2015   MCV 91.5 12/12/2015   PLT 404 12/12/2015    Anesthesia Quick Evaluation

## 2015-12-14 NOTE — Progress Notes (Signed)
MD notified of 5 beat run of vtach. No new orders. Will continue to monitor.

## 2015-12-14 NOTE — Anesthesia Procedure Notes (Signed)
Procedure Name: Intubation Date/Time: 12/14/2015 10:36 AM Performed by: Hedda Slade Pre-anesthesia Checklist: Patient identified, Emergency Drugs available, Suction available and Patient being monitored Patient Re-evaluated:Patient Re-evaluated prior to inductionOxygen Delivery Method: Circle system utilized Preoxygenation: Pre-oxygenation with 100% oxygen Intubation Type: IV induction Ventilation: Mask ventilation without difficulty Laryngoscope Size: Glidescope Grade View: Grade I Endobronchial tube: Left, Double lumen EBT and EBT position confirmed by fiberoptic bronchoscope and 37 Fr Number of attempts: 1 Airway Equipment and Method: Stylet and Video-laryngoscopy Placement Confirmation: ETT inserted through vocal cords under direct vision,  positive ETCO2 and breath sounds checked- equal and bilateral Secured at: 30 cm Tube secured with: Tape Dental Injury: Teeth and Oropharynx as per pre-operative assessment

## 2015-12-14 NOTE — Anesthesia Postprocedure Evaluation (Signed)
Anesthesia Post Note  Patient: Christina Sharp  Procedure(s) Performed: Procedure(s) (LRB): VIDEO ASSISTED THORACOSCOPY (VATS)/THOROCOTOMY (Right)  Patient location during evaluation: PACU Anesthesia Type: General Level of consciousness: awake and alert and oriented Pain management: pain level controlled Vital Signs Assessment: post-procedure vital signs reviewed and stable Respiratory status: spontaneous breathing, nonlabored ventilation and respiratory function stable Cardiovascular status: blood pressure returned to baseline and stable Postop Assessment: no signs of nausea or vomiting Anesthetic complications: no    Last Vitals:  Vitals:   12/14/15 1351 12/14/15 1412  BP: 127/76 117/71  Pulse: 70 73  Resp: 16 16  Temp:  36.4 C    Last Pain:  Vitals:   12/14/15 1412  TempSrc: Oral  PainSc:                  Richardine Peppers

## 2015-12-14 NOTE — Progress Notes (Signed)
Per Dr. Genevive Bi okay to give patient a clear liquid diet and advance as tolerated

## 2015-12-14 NOTE — H&P (Signed)
Update to H And P  There are no new changes to history or physical exam since the patient was last seen.  I have again explained the indicaitons and risks of right thoracoscopy with lung biopsy.  [employer].

## 2015-12-14 NOTE — Progress Notes (Addendum)
Pharmacy Antibiotic Note  Catalyna CZERWONKA is a 70 y.o. female admitted on 12/14/2015 post op. Pharmacy may adjust dosing strength, schedule, rate of infusion, etc as needed to optimize therapy .  Plan:  Dose adjusted to vancomycin '750mg'$  every 12 hours x 2 doses  Vancomycin serum levels PRN per pharmacy protocol.   Height: '5\' 4"'$  (162.6 cm) Weight: 155 lb (70.3 kg) IBW/kg (Calculated) : 54.7  Temp (24hrs), Avg:97.7 F (36.5 C), Min:97.3 F (36.3 C), Max:98.3 F (36.8 C)   Recent Labs Lab 12/12/15 0825  WBC 10.5  CREATININE 0.97    Estimated Creatinine Clearance: 51.1 mL/min (by C-G formula based on SCr of 0.97 mg/dL).    Allergies  Allergen Reactions  . Augmentin [Amoxicillin-Pot Clavulanate] Hives  . Penicillins Hives    Has patient had a PCN reaction causing immediate rash, facial/tongue/throat swelling, SOB or lightheadedness with hypotension: No Has patient had a PCN reaction causing severe rash involving mucus membranes or skin necrosis: No Has patient had a PCN reaction that required hospitalization No Has patient had a PCN reaction occurring within the last 10 years: Yes If all of the above answers are "NO", then may proceed with Cephalosporin use.      Antimicrobials this admission  Dose adjustments this admission:  Microbiology results:   Thank you for allowing pharmacy to be a part of this patient's care.  Pernell Dupre, PharmD Clinical Pharmacist  12/14/2015 3:10 PM

## 2015-12-15 ENCOUNTER — Inpatient Hospital Stay: Payer: BLUE CROSS/BLUE SHIELD

## 2015-12-15 ENCOUNTER — Encounter: Payer: Self-pay | Admitting: Cardiothoracic Surgery

## 2015-12-15 LAB — GLUCOSE, CAPILLARY: Glucose-Capillary: 151 mg/dL — ABNORMAL HIGH (ref 65–99)

## 2015-12-15 MED ORDER — ALBUTEROL SULFATE (2.5 MG/3ML) 0.083% IN NEBU
2.5000 mg | INHALATION_SOLUTION | Freq: Three times a day (TID) | RESPIRATORY_TRACT | Status: DC
  Administered 2015-12-15 – 2015-12-17 (×6): 2.5 mg via RESPIRATORY_TRACT
  Filled 2015-12-15 (×5): qty 3

## 2015-12-15 MED ORDER — HYDROCODONE-ACETAMINOPHEN 5-325 MG PO TABS: 1.0000 | ORAL_TABLET | ORAL | Status: DC | PRN

## 2015-12-15 NOTE — Progress Notes (Signed)
Pt's severity score is 5. Pt. Does not require bronchodilators at home. The nebulizer treatments are changed to TID and prn.

## 2015-12-15 NOTE — Progress Notes (Signed)
Slept well.  Pain is minimal  No air leak  CXRay today looks good     Lungs equal bilaterally Heart regular  Will DC Foley Start oral meds Will encourage ambulation CT to water seal

## 2015-12-16 ENCOUNTER — Inpatient Hospital Stay: Payer: BLUE CROSS/BLUE SHIELD

## 2015-12-16 ENCOUNTER — Other Ambulatory Visit: Payer: Self-pay | Admitting: Anatomic Pathology & Clinical Pathology

## 2015-12-16 LAB — GLUCOSE, CAPILLARY: Glucose-Capillary: 127 mg/dL — ABNORMAL HIGH (ref 65–99)

## 2015-12-16 NOTE — Progress Notes (Signed)
OXYGEN SATURATION  Patient saturations on 2L at rest 100%  Patient Saturations on 2Liters of oxygen while Ambulating = 81%  Patient saturation on 4L while ambulating 90%   Patient saturation decreased to less than 88% on 2L of oxygen while ambulating.

## 2015-12-16 NOTE — Progress Notes (Signed)
Coughing spell this morning but now better.  Some discomfort but actually doing quite well from pain standpoint.  CT drained 110 cc over the last 24 hours.  No air leak seen  Reviewed CXRay from this morning on water seal for 24 hours with our radiologist.  Miniscule locule of air laterally.    Chest tube removed without incident.  Wounds look good  Path is still pending.    Encourage ambulation Wean oxygen as tolerated

## 2015-12-17 ENCOUNTER — Other Ambulatory Visit: Payer: Self-pay | Admitting: Anatomic Pathology & Clinical Pathology

## 2015-12-17 ENCOUNTER — Inpatient Hospital Stay: Payer: BLUE CROSS/BLUE SHIELD

## 2015-12-17 MED ORDER — HYDROCODONE-ACETAMINOPHEN 5-325 MG PO TABS: 1.0000 | ORAL_TABLET | ORAL | 0 refills | Status: DC | PRN

## 2015-12-17 MED ORDER — TRAMADOL HCL 50 MG PO TABS: 50.0000 mg | ORAL_TABLET | Freq: Two times a day (BID) | ORAL | 0 refills | Status: DC | PRN

## 2015-12-17 NOTE — Progress Notes (Signed)
Patient had a medium bowel movement.  MD notified.

## 2015-12-17 NOTE — Progress Notes (Signed)
Subjective:   She is breathing comfortably this morning with this mild cough. She is off supplemental oxygen, having being weaned away over the course of the evening. She denies any shortness of breath. Chest x-ray this morning reveals a minimal apical pneumothorax and some mild pleural effusion with minimal change from yesterday. Her pain is under good control. She's 40 spontaneously but has not had a bowel movement as yet.  Vital signs in last 24 hours: Temp:  [97.7 F (36.5 C)-98.3 F (36.8 C)] 98.3 F (36.8 C) (11/18 0549) Pulse Rate:  [85-93] 93 (11/18 0549) Resp:  [17-18] 18 (11/18 0549) BP: (108-136)/(63-75) 136/75 (11/18 0549) SpO2:  [81 %-100 %] 91 % (11/18 0549) FiO2 (%):  [36 %] 36 % (11/17 1429) Last BM Date: 12/14/15  Intake/Output from previous day: 11/17 0701 - 11/18 0700 In: 240 [P.O.:240] Out: 150 [Urine:150]  Exam:  Her wounds look good. Her lungs are clear with some decreased breath sounds on the right side. I do not hear any rales or rhonchi.  Lab Results:  CBC No results for input(s): WBC, HGB, HCT, PLT in the last 72 hours. CMP     Component Value Date/Time   NA 138 12/12/2015 0825   K 3.6 12/12/2015 0825   CL 107 12/12/2015 0825   CO2 23 12/12/2015 0825   GLUCOSE 115 (H) 12/12/2015 0825   BUN 15 12/12/2015 0825   CREATININE 0.97 12/12/2015 0825   CALCIUM 9.3 12/12/2015 0825   PROT 7.3 12/12/2015 0825   ALBUMIN 3.8 12/12/2015 0825   AST 20 12/12/2015 0825   ALT 15 12/12/2015 0825   ALKPHOS 97 12/12/2015 0825   BILITOT 0.7 12/12/2015 0825   GFRNONAA 57 (L) 12/12/2015 0825   GFRAA >60 12/12/2015 0825   PT/INR No results for input(s): LABPROT, INR in the last 72 hours.  Studies/Results: Dg Chest 2 View  Result Date: 12/17/2015 CLINICAL DATA:  Shortness of Breath.  Chest tube removal. EXAM: CHEST  2 VIEW COMPARISON:  12/16/2015 FINDINGS: Right chest tube has been removed. There is a small residual right apical pneumothorax, less than 5%. Right  lower lobe atelectasis with small right effusion noted. No confluent opacity on the left. Heart is normal size. IMPRESSION: Interval removal of right chest tube with small residual right apical pneumothorax, less than 5%. Small right effusion with right base atelectasis. Electronically Signed   By: Rolm Baptise M.D.   On: 12/17/2015 09:31   Dg Chest 2 View  Result Date: 12/16/2015 CLINICAL DATA:  Followup right VATS. EXAM: CHEST  2 VIEW COMPARISON:  Yesterday. FINDINGS: Normal sized heart. Tortuous aorta. Aortic calcifications. Bibasilar atelectasis. Stable right chest tube. Tiny right apical pneumothorax. Diffuse osteopenia. IMPRESSION: 1. Tiny right apical pneumothorax with a right chest tube in place. 2. Bibasilar atelectasis, improving on the right. Electronically Signed   By: Claudie Revering M.D.   On: 12/16/2015 09:54    Assessment/Plan: She seems to be improving. She is now off supplemental oxygen. We would like to have her move her bowels prior to discharge. I discussed this plan with the patient and her daughter.

## 2015-12-20 ENCOUNTER — Encounter: Payer: Self-pay | Admitting: Hematology and Oncology

## 2015-12-20 ENCOUNTER — Inpatient Hospital Stay (HOSPITAL_BASED_OUTPATIENT_CLINIC_OR_DEPARTMENT_OTHER): Payer: BLUE CROSS/BLUE SHIELD | Admitting: Hematology and Oncology

## 2015-12-20 VITALS — BP 118/71 | HR 85 | Temp 97.0°F | Resp 18 | Wt 155.6 lb

## 2015-12-20 DIAGNOSIS — R918 Other nonspecific abnormal finding of lung field: Secondary | ICD-10-CM | POA: Diagnosis not present

## 2015-12-20 DIAGNOSIS — E7800 Pure hypercholesterolemia, unspecified: Secondary | ICD-10-CM | POA: Diagnosis not present

## 2015-12-20 DIAGNOSIS — I10 Essential (primary) hypertension: Secondary | ICD-10-CM | POA: Diagnosis not present

## 2015-12-20 DIAGNOSIS — Z85828 Personal history of other malignant neoplasm of skin: Secondary | ICD-10-CM | POA: Diagnosis not present

## 2015-12-20 DIAGNOSIS — J449 Chronic obstructive pulmonary disease, unspecified: Secondary | ICD-10-CM

## 2015-12-20 DIAGNOSIS — F17210 Nicotine dependence, cigarettes, uncomplicated: Secondary | ICD-10-CM

## 2015-12-20 DIAGNOSIS — Z7982 Long term (current) use of aspirin: Secondary | ICD-10-CM

## 2015-12-20 DIAGNOSIS — Z79899 Other long term (current) drug therapy: Secondary | ICD-10-CM | POA: Diagnosis not present

## 2015-12-20 DIAGNOSIS — C3431 Malignant neoplasm of lower lobe, right bronchus or lung: Secondary | ICD-10-CM

## 2015-12-20 DIAGNOSIS — N2889 Other specified disorders of kidney and ureter: Secondary | ICD-10-CM

## 2015-12-20 DIAGNOSIS — C3411 Malignant neoplasm of upper lobe, right bronchus or lung: Secondary | ICD-10-CM | POA: Diagnosis not present

## 2015-12-20 NOTE — Progress Notes (Signed)
Patient discharged from [facility] on Saturday post lung biopsy.  States she is using Tramadol for pain.  Patient does have a 2X2 cm blister on her right side from the tape used on her dressing.

## 2015-12-20 NOTE — Progress Notes (Signed)
[facility] day:  12/20/2015  Chief Complaint: Christina Sharp is a 70 y.o. female with a right lung mass who is seen for assessment after interval biopsy.  HPI: The patient was last seen in the medical oncology clinic on 11/30/2015.  At that time, we discussed referral to thoracic surgery for possible biopsy as tissue was not amenable to bronchoscopy or CT guided biopsy.  She underwent video assisted thoracoscopy (VATS) by Dr. Genevive Bi on 12/14/2015.  Within the right lower lobe there is an infarcted area measuring about 2 cm at the very edge just at the level of the diaphragm.  On the undersurface of the right middle lobe there is a palpable mass.  The mass was excised.    Pathology from the right middle lobe wedge resection revealed adenocarcinoma, lepidic (80%), papillary (10%), and solid (10%).  Pleural biopsy was negative for malignancy.  Tumor size was 4 x 4 x 3 mm.  Symptomatically, she is doing well.  She is taking Tramadol for pain.  She notes irritation due to tape.   Past Medical History:  Diagnosis Date  . Cancer ([place])    basal cell   . Emphysema of lung ([place])   . Hypercholesteremia   . Hypertension   . Renal disorder     Past Surgical History:  Procedure Laterality Date  . BASAL CELL CARCINOMA EXCISION    . COLONOSCOPY    . ENDOBRONCHIAL ULTRASOUND N/A 11/22/2015   Procedure: ENDOBRONCHIAL ULTRASOUND;  Surgeon: Flora Lipps, MD;  Location: [facility] ORS;  Service: Cardiopulmonary;  Laterality: N/A;  . FLEXIBLE BRONCHOSCOPY N/A 09/22/2015   Procedure: FLEXIBLE BRONCHOSCOPY;  Surgeon: [name], MD;  Location: [facility] ORS;  Service: Pulmonary;  Laterality: N/A;  . VIDEO ASSISTED THORACOSCOPY (VATS)/THOROCOTOMY Right 12/14/2015   Procedure: VIDEO ASSISTED THORACOSCOPY (VATS)/THOROCOTOMY;  Surgeon: Nestor Lewandowsky, MD;  Location: [facility] OR[facility];  Service: Thoracic;  Laterality: Right;    Family History  Problem Relation Age of Onset  .  Alzheimer's disease Mother   . Heart disease Father   . Heart attack Brother     Social History:  reports that she quit smoking about 6 weeks ago. Her smoking use included Cigarettes. She has a 22.50 pack-year smoking history. She has never used smokeless tobacco. She reports that she drinks alcohol. She reports that she does not use drugs.  She smoked 1 pack a day for 45-50 years.  She has not smoked in the past 16 days.  The patient is from [place].  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  She denies any exposure to radiation or toxins.  The patient is accompanied by her room-matetoday.  Allergies:  Allergies  Allergen Reactions  . Augmentin [Amoxicillin-Pot Clavulanate] Hives  . Penicillins Hives    Has patient had a PCN reaction causing immediate rash, facial/tongue/throat swelling, SOB or lightheadedness with hypotension: No Has patient had a PCN reaction causing severe rash involving mucus membranes or skin necrosis: No Has patient had a PCN reaction that required hospitalization No Has patient had a PCN reaction occurring within the last 10 years: Yes If all of the above answers are "NO", then may proceed with Cephalosporin use.      Current Medications: Current Outpatient Prescriptions  Medication Sig Dispense Refill  . amLODipine (NORVASC) 10 MG tablet Take 10 mg by mouth daily.    Marland Kitchen aspirin 81 MG tablet Take 81 mg by mouth daily.    Marland Kitchen  atorvastatin (LIPITOR) 20 MG tablet Take 20 mg by mouth at bedtime.     . hydrALAZINE (APRESOLINE) 50 MG tablet Take 50 mg by mouth 3 (three) times daily.    . metoprolol succinate (TOPROL-XL) 100 MG 24 hr tablet Take 100 mg by mouth daily. Take with or immediately following a meal.    . sodium bicarbonate 650 MG tablet Take 1,300 mg by mouth 2 (two) times daily.     . traMADol (ULTRAM) 50 MG tablet Take 1 tablet (50 mg total) by mouth every 12 (twelve) hours as needed. 30 tablet 0  .  HYDROcodone-acetaminophen (NORCO/VICODIN) 5-325 MG tablet Take 1-2 tablets by mouth every 4 (four) hours as needed for moderate pain. (Patient not taking: Reported on 12/20/2015) 30 tablet 0   No current facility-administered medications for this visit.     Review of Systems:  GENERAL:  Feels "fine".  No fevers or sweats.  No weight loss. PERFORMANCE STATUS (ECOG):  0 HEENT:  No visual changes, runny nose, sore throat, mouth sores or tenderness. Lungs:  No shortness of breath.  Rare cough.  No hemoptysis. Cardiac:  No chest pain, palpitations, orthopnea, or PND. GI:  No nausea, vomiting, diarrhea, constipation, melena or hematochezia.  Last colonoscopy > 10 years ago. GU:  No urgency, frequency, dysuria, or hematuria. Musculoskeletal:  No back pain.  No joint pain.  No muscle tenderness. Extremities:  No pain or swelling. Skin:  No rashes or skin changes. Neuro:  No headache, numbness or weakness, balance or coordination issues. Endocrine:  No diabetes, thyroid issues, hot flashes or night sweats. Psych:  No mood changes, depression or anxiety. Pain:  No focal pain. Review of systems:  All other systems reviewed and found to be negative.  Physical Exam: Blood pressure 118/71, pulse 85, temperature 97 F (36.1 C), temperature source Tympanic, resp. rate 18, weight 155 lb 10.3 oz (70.6 kg). GENERAL:  Well developed, well nourished, woman sitting comfortably in the exam room in no acute distress. MENTAL STATUS:  Alert and oriented to person, place and time. HEAD:  Short gray hair.  Normocephalic, atraumatic, face symmetric, no Cushingoid features. EYES:  Brown eyes.  No conjunctivitis or scleral icterus. RESPIRATORY:  Clear to auscultation without rales, wheezes or rhonchi.  Soft rub right lower lobe. CARDIOVASCULAR:  Regular rate and rhythm without murmur, rub or gallop. CHEST WALL:  Blister on back.  Tape irritation.  Sutures in place. EXTREMITIES: No edema, no skin discoloration or  tenderness. NEUROLOGICAL: Unremarkable. PSYCH:  Appropriate.   No visits with results within 3 Day(s) from this visit.  Latest known visit with results is:  Admission on 12/14/2015, Discharged on 12/17/2015  Component Date Value Ref Range Status  . ABO/RH(D) 12/14/2015 O NEG   Final  . SURGICAL PATHOLOGY 12/17/2015    Final                   Value:Surgical Pathology CASE: [id number] PATIENT: Keajah Burmester Surgical Pathology Report     SPECIMEN SUBMITTED: A. Lung, right middle lobe, wedge resection B. Pleural; biopsy C. Lung, right middle lobe, wedge resection  CLINICAL HISTORY: None provided  PRE-OPERATIVE DIAGNOSIS: Lung mass  POST-OPERATIVE DIAGNOSIS: Same as pre-op     DIAGNOSIS: A. LUNG, RIGHT MIDDLE LOBE; WEDGE RESECTION: - ADENOCARCINOMA, LEPIDIC (80%), PAPILLARY (10%), AND SOLID (10%) PATTERNS, SEE COMMENT.  B. PLEURA; BIOPSY: - FIBRINOUS AND ORGANIZING PLEURITIS. - NEGATIVE FOR MALIGNANCY.  C. LUNG, RIGHT MIDDLE LOBE; WEDGE RESECTION: - ADENOCARCINOMA, LEPIDIC, PAPILLARY, AND SOLID  PATTERNS.  Comment: Parts A and C represent two halves of one wedge resection, bisected through the mass and submitted separately. There are at least two separate foci of carcinoma in this sample. No visceral pleural invasion is seen. Immunohistochemistry was performed for TTF-1, which outlines pre                         -existing alveoli occupied by tumor cells, but does not show definite staining of tumor cells. However, this does not rule out lung origin in this case, as some pulmonary adenocarcinomas are TTF-1 negative.  The patient is known to have multifocal disease which is clinically of lung origin. The specimen was obtained for the purposes of diagnosis and ancillary testing only. For this reason, a cancer checklist is not provided, and staging should be based on imaging findings.  There is adequate tissue for ancillary studies.  The diagnosis was  discussed with Dr. Genevive Bi on 12/16/15.   GROSS DESCRIPTION: A. Intraoperative Consultation:     Received: fresh     Specimen: right middle lobe     Pathologic Evaluation: frozen section and touch prep     Diagnosis: Lung, right middle lobe; wedge biopsy:     - Positive for adenocarcinoma.     Communicated to: Dr. Genevive Bi at 12:04 PM on 12/14/2015, Bryan Lemma M.D.     Tissue submitted: A1     Note: Frozen section is en face of surfac                         e marked with suture, with visible mass A. Labeled: right middle lobe Type of procedure: wedge biopsy Laterality: right Weight of specimen: 2 grams Size of specimen: 2.8 x 2.5 x 1.1 cm Orientation: 1 suture and a 3.5 cm long staple line Specimen integrity: open surface adjacent to the suture  Ink: open aspect with suture-green; pleura-blue; margin after removal of staple line-yellow Number of masses: 1 Size(s) of mass(es): 0.4 x 0.4 x 0.3 cm Location of mass(es): at suture on open aspect of specimen, adjacent to pleura Description of mass: ill-defined tan Relationship of mass to bronchus: not applicable Margins: Tumor is at open surface Relationship/distance of mass(es) to pleura: abutting Description of remainder of lung: crepitant pink-tan  Block summary: 1-frozen section remnant 2-3-remaining nodule 4-remaining tissue  B. Labeled: pleural biopsy Tissue fragment(s): multiple Size: aggregate, 2.4 x 1.0 x 0.2 cm Description: fibrous pink-tan fragments  E                         ntirely submitted in 1 cassette(s).   C. Labeled: adenocarcinoma of right middle lobe Type of procedure: wedge Laterality: right Weight of specimen: 2 grams Size of specimen: 4.2 x 1.5 x 1.0 cm Orientation: 4.1 cm long staple line Specimen integrity: one edge open Ink: open edge-green; pleura-blue; parenchymal margin beneath staple line-yellow Number of masses: 1 Size(s) of mass: 0.4 x 0.4 x 0.2 cm Location of mass: at the open  edge Description of mass: ill-defined tan Relationship of mass to bronchus: not applicable Margins: 0.1 from parenchyma adjacent to staple line Relationship/distance of mass to pleura: abutting Description of remainder of lung: crepitant red  Block summary: 1-mass 2-3-remaining tissue  Final Diagnosis performed by Bryan Lemma, MD.  Electronically signed 12/17/2015 1:58:36PM    The electronic signature indicates that the named Attending Pathologist has evaluated the specimen  Technical  component performed at [facility], [place], [place], [place] [address] Lab: [id number] Dir: Darrick Penna. Evette Doffing, MD  Professional component performed at [facility], [facility], [place], [place],  [address] Lab: [id number] Dir: Dellia Nims. Rubinas, MD    . Glucose-Capillary 12/15/2015 151* 65 - 99 mg/dL Final  . Glucose-Capillary 12/16/2015 127* 65 - 99 mg/dL Final    Assessment:  POLLIE POMA is a 70 y.o. 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  Bronchoscopy on 09/22/2015 revealed mild to moderate chronic bronchitis changes with no endobronchial lesions.  Brushings from the right lower lobe were negative for malignancy.  Repeat bronchoscopy on 11/22/2015 was performed.  An EBUS scope was unable to be passed in the RML.  Transbronchial needle aspirations of a lesion were performed in the medial segment of the right middle lobe.  Pathology revealed atypical cells.  CEA and LDH were normal on 11/23/2015.  PET scan on 11/21/2015 revealed a 2.9 x 3 cm central right lower lobe lung lesion (SUV 7.0).  Areas of surrounding more patchy right lower lobe opacity were also suspicious for metastasis or synchronous primaries. There were bilateral pulmonary nodules, including hypermetabolic right upper and right lower lobe pulmonary nodules suspicious for metastatic disease.  There was no thoracic nodal hypermetabolism.  There was an  area of soft tissue fullness within the high left neck suspicious for an enlarged lateral retropharyngeal node (? skip nodal metastasis).  There was indeterminate right renal lesion.  Clinical stage is T3N0M1.  She underwent video assisted thoracoscopy (VATS) on 12/14/2015.  On the undersurface of the right middle lobe there is a palpable mass.  Pathology from the right middle lobe wedge resection revealed adenocarcinoma, lepidic (80%), papillary (10%), and solid (10%).  Pleural biopsy was negative for malignancy.  Symptomatically, she is recovering well.  Exam reveals tape irritation.  Plan: 1.  Review pathology from thoroscopic biopsy.  Pathology confirmed adenocarcinoma.  Discuss sending driver mutation studies (EGFR, ALK, ROS1, BRAF, PDL-1) or [facility] testing. 2.  Discuss prior imaging revealing multi-focal disease and likely metastasis.  Await mutational testing for treatment options. 3.  Schedule head MRI. 4.  Present at tumor board on 12/29/2015. 5.  RTC after [facility] testing back to discuss treatment options.   Lequita Asal, MD  12/20/2015, 10:52 AM

## 2015-12-26 ENCOUNTER — Other Ambulatory Visit: Payer: Self-pay | Admitting: Cardiothoracic Surgery

## 2015-12-26 DIAGNOSIS — R918 Other nonspecific abnormal finding of lung field: Secondary | ICD-10-CM

## 2015-12-28 ENCOUNTER — Telehealth: Payer: Self-pay

## 2015-12-28 ENCOUNTER — Ambulatory Visit: Payer: BLUE CROSS/BLUE SHIELD | Admitting: Hematology and Oncology

## 2015-12-28 NOTE — Telephone Encounter (Signed)
Called patient but had to leave a voicemail.   I need to ask if she is able to be seen earlier (8:00 AM). She also needs to have a chest X-Ray prior to her appointment.

## 2015-12-29 ENCOUNTER — Inpatient Hospital Stay: Payer: BLUE CROSS/BLUE SHIELD

## 2015-12-29 NOTE — Telephone Encounter (Signed)
Patient called back and stayed with a late afternoon appointment.

## 2015-12-30 ENCOUNTER — Telehealth: Payer: Self-pay

## 2015-12-30 ENCOUNTER — Ambulatory Visit
Admission: RE | Admit: 2015-12-30 | Discharge: 2015-12-30 | Disposition: A | Discharge status: Home / Self Care | Payer: BLUE CROSS/BLUE SHIELD | Source: Ambulatory Visit | Attending: Cardiothoracic Surgery | Admitting: Cardiothoracic Surgery

## 2015-12-30 ENCOUNTER — Ambulatory Visit (INDEPENDENT_AMBULATORY_CARE_PROVIDER_SITE_OTHER): Payer: BLUE CROSS/BLUE SHIELD | Admitting: Cardiothoracic Surgery

## 2015-12-30 ENCOUNTER — Telehealth: Payer: Self-pay | Admitting: *Deleted

## 2015-12-30 ENCOUNTER — Encounter: Payer: Self-pay | Admitting: Cardiothoracic Surgery

## 2015-12-30 VITALS — BP 137/86 | HR 82 | Temp 98.2°F | Wt 149.0 lb

## 2015-12-30 DIAGNOSIS — J90 Pleural effusion, not elsewhere classified: Secondary | ICD-10-CM | POA: Insufficient documentation

## 2015-12-30 DIAGNOSIS — R918 Other nonspecific abnormal finding of lung field: Secondary | ICD-10-CM | POA: Insufficient documentation

## 2015-12-30 DIAGNOSIS — I700 Atherosclerosis of aorta: Secondary | ICD-10-CM | POA: Diagnosis not present

## 2015-12-30 MED ORDER — LORAZEPAM 1 MG PO TABS: ORAL_TABLET | ORAL | 0 refills | Status: DC

## 2015-12-30 NOTE — Patient Instructions (Signed)
Spoke with Dr. Kem Parkinson nurse and she stated that she will need to speak with the doctor to see if she could prescribe you something before you have your MRI tomorrow.  Please give [place] a call if you have any questions or concerns.

## 2015-12-30 NOTE — Progress Notes (Signed)
She returns today in follow-up. Her wounds are all healing as expected. She's really done quite well. She did see Dr. Mike Gip who has ordered some additional studies to be performed. She's had no fevers or chills. We did remove her sutures today. Her chest x-ray looks good. We Steri-Stripped her wound and we'll see her back again on an as-needed basis.

## 2015-12-30 NOTE — Telephone Encounter (Signed)
Pt at the office and states she has MRI ordered for in am. She is claustrophobic and does not feel like she cando it without something to help relax her. I spoke to [place] and she said yes. Ativan 1 mg po x 1 pill. I called the patient and let her know I will cal lit in and I called [facility] and called in a 1 time lorazepam '1mg'$  for MRI.

## 2015-12-30 NOTE — Telephone Encounter (Signed)
Called Dr. Kem Parkinson office and spoke to her nurse Barbera Setters in reference to patient requesting for medications for her MRI for tomorrow (12/31/2015) since she stated that she was claustrophobic. Sheri stated that she would need to speak with Dr. Mike Gip and then she will contact the patient.   This information was given to the patient and she agreed.

## 2015-12-31 ENCOUNTER — Encounter
Admission: RE | Admit: 2015-12-31 | Discharge: 2015-12-31 | Disposition: A | Discharge status: Home / Self Care | Payer: BLUE CROSS/BLUE SHIELD | Source: Ambulatory Visit | Attending: Hematology and Oncology | Admitting: Hematology and Oncology

## 2015-12-31 DIAGNOSIS — C3431 Malignant neoplasm of lower lobe, right bronchus or lung: Secondary | ICD-10-CM | POA: Insufficient documentation

## 2015-12-31 MED ORDER — GADOBENATE DIMEGLUMINE 529 MG/ML IV SOLN
15.0000 mL | Freq: Once | INTRAVENOUS | Status: AC | PRN
  Administered 2015-12-31: 15 mL via INTRAVENOUS

## 2016-01-03 ENCOUNTER — Inpatient Hospital Stay: Payer: BLUE CROSS/BLUE SHIELD

## 2016-01-03 ENCOUNTER — Inpatient Hospital Stay: Payer: BLUE CROSS/BLUE SHIELD | Attending: Hematology and Oncology | Admitting: Hematology and Oncology

## 2016-01-03 VITALS — BP 123/77 | HR 97 | Temp 98.2°F | Resp 18 | Wt 150.6 lb

## 2016-01-03 DIAGNOSIS — Z79899 Other long term (current) drug therapy: Secondary | ICD-10-CM | POA: Diagnosis not present

## 2016-01-03 DIAGNOSIS — Z7982 Long term (current) use of aspirin: Secondary | ICD-10-CM | POA: Insufficient documentation

## 2016-01-03 DIAGNOSIS — J439 Emphysema, unspecified: Secondary | ICD-10-CM | POA: Diagnosis not present

## 2016-01-03 DIAGNOSIS — Z85828 Personal history of other malignant neoplasm of skin: Secondary | ICD-10-CM | POA: Insufficient documentation

## 2016-01-03 DIAGNOSIS — I739 Peripheral vascular disease, unspecified: Secondary | ICD-10-CM | POA: Diagnosis not present

## 2016-01-03 DIAGNOSIS — F17210 Nicotine dependence, cigarettes, uncomplicated: Secondary | ICD-10-CM | POA: Diagnosis not present

## 2016-01-03 DIAGNOSIS — I10 Essential (primary) hypertension: Secondary | ICD-10-CM | POA: Insufficient documentation

## 2016-01-03 DIAGNOSIS — E538 Deficiency of other specified B group vitamins: Secondary | ICD-10-CM | POA: Diagnosis not present

## 2016-01-03 DIAGNOSIS — E7800 Pure hypercholesterolemia, unspecified: Secondary | ICD-10-CM

## 2016-01-03 DIAGNOSIS — C3431 Malignant neoplasm of lower lobe, right bronchus or lung: Secondary | ICD-10-CM | POA: Diagnosis not present

## 2016-01-03 MED ORDER — FOLIC ACID 1 MG PO TABS: 1.0000 mg | ORAL_TABLET | Freq: Every day | ORAL | 3 refills | Status: DC

## 2016-01-03 MED ORDER — CYANOCOBALAMIN 1000 MCG/ML IJ SOLN
1000.0000 ug | Freq: Once | INTRAMUSCULAR | Status: AC
  Administered 2016-01-03: 1000 ug via INTRAMUSCULAR
  Filled 2016-01-03: qty 1

## 2016-01-03 NOTE — Progress Notes (Signed)
[facility] day:  01/03/2016  Chief Complaint: Christina Sharp is a 70 y.o. 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  HPI: The patient was last seen in the medical oncology clinic on 12/20/2015.  At that time, biopsy results were reviewed.  Foundation One results wee requested.  We discussed head MRI for staging.  Head MRI on 12/31/2015 revealed atrophy and small vessel disease.  There were noo acute intracranial findings or intracranial metastatic disease.  There was suspected metastatic disease to a 1.2 x 2.3 cmleft lateral retropharyngeal node which displaced the left ICA medially.  Symptomatically, she is doing well.  She denies any complaint.   Past Medical History:  Diagnosis Date  . Cancer ([place])    basal cell   . Emphysema of lung ([place])   . Hypercholesteremia   . Hypertension   . Renal disorder     Past Surgical History:  Procedure Laterality Date  . BASAL CELL CARCINOMA EXCISION    . COLONOSCOPY    . ENDOBRONCHIAL ULTRASOUND N/A 11/22/2015   Procedure: ENDOBRONCHIAL ULTRASOUND;  Surgeon: Flora Lipps, MD;  Location: [facility] ORS;  Service: Cardiopulmonary;  Laterality: N/A;  . FLEXIBLE BRONCHOSCOPY N/A 09/22/2015   Procedure: FLEXIBLE BRONCHOSCOPY;  Surgeon: [name], MD;  Location: [facility] ORS;  Service: Pulmonary;  Laterality: N/A;  . VIDEO ASSISTED THORACOSCOPY (VATS)/THOROCOTOMY Right 12/14/2015   Procedure: VIDEO ASSISTED THORACOSCOPY (VATS)/THOROCOTOMY;  Surgeon: Nestor Lewandowsky, MD;  Location: [facility] ORS;  Service: Thoracic;  Laterality: Right;    Family History  Problem Relation Age of Onset  . Alzheimer's disease Mother   . Heart disease Father   . Heart attack Brother     Social History:  reports that she quit smoking about 8 weeks ago. Her smoking use included Cigarettes. She has a 22.50 pack-year smoking history. She has never used smokeless tobacco. She  reports that she drinks alcohol. She reports that she does not use drugs.  She smoked 1 pack a day for 45-50 years.  She has not smoked in the past 16 days.  The patient is from [place].  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  She denies any exposure to radiation or toxins.  The patient is accompanied by a friend today.  Allergies:  Allergies  Allergen Reactions  . Augmentin [Amoxicillin-Pot Clavulanate] Hives  . Penicillins Hives    Has patient had a PCN reaction causing immediate rash, facial/tongue/throat swelling, SOB or lightheadedness with hypotension: No Has patient had a PCN reaction causing severe rash involving mucus membranes or skin necrosis: No Has patient had a PCN reaction that required hospitalization No Has patient had a PCN reaction occurring within the last 10 years: Yes If all of the above answers are "NO", then may proceed with Cephalosporin use.      Current Medications: Current Outpatient Prescriptions  Medication Sig Dispense Refill  . amLODipine (NORVASC) 10 MG tablet Take 10 mg by mouth daily.    Marland Kitchen aspirin 81 MG tablet Take 81 mg by mouth daily.    Marland Kitchen atorvastatin (LIPITOR) 20 MG tablet Take 20 mg by mouth at bedtime.     . hydrALAZINE (APRESOLINE) 50 MG tablet Take 50 mg by mouth 3 (three) times daily.    . metoprolol succinate (TOPROL-XL) 100 MG 24 hr tablet Take 100 mg by mouth daily. Take with or immediately following a  meal.    . sodium bicarbonate 650 MG tablet Take 1,300 mg by mouth 2 (two) times daily.     Marland Kitchen HYDROcodone-acetaminophen (NORCO/VICODIN) 5-325 MG tablet Take 1-2 tablets by mouth every 4 (four) hours as needed for moderate pain. (Patient not taking: Reported on 01/03/2016) 30 tablet 0  . LORazepam (ATIVAN) 1 MG tablet 1 tablet by mouth1 hour prior to MRI (Patient not taking: Reported on 01/03/2016) 1 tablet 0  . traMADol (ULTRAM) 50 MG tablet Take 1 tablet (50 mg total) by mouth every 12 (twelve) hours as  needed. (Patient not taking: Reported on 01/03/2016) 30 tablet 0   No current facility-administered medications for this visit.     Review of Systems:  GENERAL:  Feels fine.  No fevers or sweats.  Weight down 5 pounds. PERFORMANCE STATUS (ECOG):  0 HEENT:  No visual changes, runny nose, sore throat, mouth sores or tenderness. Lungs:  No shortness of breath.  Rare cough.  No hemoptysis. Cardiac:  No chest pain, palpitations, orthopnea, or PND. GI:  No nausea, vomiting, diarrhea, constipation, melena or hematochezia.  Last colonoscopy > 10 years ago. GU:  No urgency, frequency, dysuria, or hematuria. Musculoskeletal:  No back pain.  No joint pain.  No muscle tenderness. Extremities:  No pain or swelling. Skin:  No rashes or skin changes. Neuro:  No headache, numbness or weakness, balance or coordination issues. Endocrine:  No diabetes, thyroid issues, hot flashes or night sweats. Psych:  No mood changes, depression or anxiety. Pain:  No focal pain. Review of systems:  All other systems reviewed and found to be negative.  Physical Exam: Blood pressure 123/77, pulse 97, temperature 98.2 F (36.8 C), temperature source Tympanic, resp. rate 18, weight 150 lb 9.2 oz (68.3 kg). GENERAL:  Well developed, well nourished, woman sitting comfortably in the exam room in no acute distress. MENTAL STATUS:  Alert and oriented to person, place and time. HEAD:  Short gray hair.  Normocephalic, atraumatic, face symmetric, no Cushingoid features. EYES:  Brown eyes.  No conjunctivitis or scleral icterus. NEUROLOGICAL: Unremarkable. PSYCH:  Appropriate.   No visits with results within 3 Day(s) from this visit.  Latest known visit with results is:  Admission on 12/14/2015, Discharged on 12/17/2015  Component Date Value Ref Range Status  . ABO/RH(D) 12/14/2015 O NEG   Final  . SURGICAL PATHOLOGY 12/21/2015    Final-Edited                   Value:Surgical Pathology  THIS IS AN ADDENDUM REPORT CASE:  [id number] PATIENT: Christina Sharp Surgical Pathology Report Addendum  Reason for Addendum #1:  Immunohistochemistry results  SPECIMEN SUBMITTED: A. Lung, right middle lobe, wedge resection B. Pleural; biopsy C. Lung, right middle lobe, wedge resection  CLINICAL HISTORY: None provided  PRE-OPERATIVE DIAGNOSIS: Lung mass  POST-OPERATIVE DIAGNOSIS: Same as pre-op     DIAGNOSIS: A. LUNG, RIGHT MIDDLE LOBE; WEDGE RESECTION: - ADENOCARCINOMA, LEPIDIC (80%), PAPILLARY (10%), AND SOLID (10%) PATTERNS, SEE COMMENT.  B. PLEURA; BIOPSY: - FIBRINOUS AND ORGANIZING PLEURITIS. - NEGATIVE FOR MALIGNANCY.  C. LUNG, RIGHT MIDDLE LOBE; WEDGE RESECTION: - ADENOCARCINOMA, LEPIDIC, PAPILLARY, AND SOLID PATTERNS.  Comment: Parts A and C represent two halves of one wedge resection, bisected through the mass and submitted separately. There are at least two separate foci of carcinoma                          in this sample. No visceral  pleural invasion is seen. Immunohistochemistry was performed for TTF-1, which outlines pre-existing alveoli occupied by tumor cells, but does not show definite staining of tumor cells. However, this does not rule out lung origin in this case, as some pulmonary adenocarcinomas are TTF-1 negative.  The patient is known to have multifocal disease which is clinically of lung origin. The specimen was obtained for the purposes of diagnosis and ancillary testing only. For this reason, a cancer checklist is not provided, and staging should be based on imaging findings.  There is adequate tissue for ancillary studies.  The diagnosis was discussed with Dr. Genevive Bi on 12/16/15.   GROSS DESCRIPTION: A. Intraoperative Consultation:     Received: fresh     Specimen: right middle lobe     Pathologic Evaluation: frozen section and touch prep     Diagnosis: Lung, right middle lobe; wedge biopsy:     - Positive for adenocarcinoma.     Communicated to: Dr.                           Genevive Bi at 12:04 PM on 12/14/2015, Bryan Lemma M.D.     Tissue submitted: A1     Note: Frozen section is en face of surface marked with suture, with visible mass A. Labeled: right middle lobe Type of procedure: wedge biopsy Laterality: right Weight of specimen: 2 grams Size of specimen: 2.8 x 2.5 x 1.1 cm Orientation: 1 suture and a 3.5 cm long staple line Specimen integrity: open surface adjacent to the suture  Ink: open aspect with suture-green; pleura-blue; margin after removal of staple line-yellow Number of masses: 1 Size(s) of mass(es): 0.4 x 0.4 x 0.3 cm Location of mass(es): at suture on open aspect of specimen, adjacent to pleura Description of mass: ill-defined tan Relationship of mass to bronchus: not applicable Margins: Tumor is at open surface Relationship/distance of mass(es) to pleura: abutting Description of remainder of lung: crepitant pink-tan  Block summary: 1-frozen section remnant 2-3-remaining nodule 4-remaining tissue  B. Labeled: pleural                          biopsy Tissue fragment(s): multiple Size: aggregate, 2.4 x 1.0 x 0.2 cm Description: fibrous pink-tan fragments  Entirely submitted in 1 cassette(s).   C. Labeled: adenocarcinoma of right middle lobe Type of procedure: wedge Laterality: right Weight of specimen: 2 grams Size of specimen: 4.2 x 1.5 x 1.0 cm Orientation: 4.1 cm long staple line Specimen integrity: one edge open Ink: open edge-green; pleura-blue; parenchymal margin beneath staple line-yellow Number of masses: 1 Size(s) of mass: 0.4 x 0.4 x 0.2 cm Location of mass: at the open edge Description of mass: ill-defined tan Relationship of mass to bronchus: not applicable Margins: 0.1 from parenchyma adjacent to staple line Relationship/distance of mass to pleura: abutting Description of remainder of lung: crepitant red  Block summary: 1-mass 2-3-remaining tissue   Final Diagnosis performed by Bryan Lemma,  MD.  Electronically signed 12/17/2015 1:58:36PM   The electronic signature indicates that                          the named Attending Pathologist has evaluated the specimen  Technical component performed at [facility], [address], [place], [place] [address] Lab: [phone] Dir: Darrick Penna. Evette Doffing, MD  Professional component performed at [facility], [facility], [place], [place], [place] [address] Lab:  [phone] Dir: Dellia Nims. Reuel Derby, MD   ADDENDUM: Immunohistochemistry was performed for Napsin A. Most of the neoplastic cells are negative and a few show weak staining. Interpretation is difficult in some areas because of abundant alveolar macrophages showing strong staining. The results are indeterminate with regard to tissue of origin. Addendum #1 performed by Bryan Lemma, MD.  Electronically signed 12/21/2015 2:22:53PM    Technical component performed at [facility], [address], [place], [place] [address] Lab: [phone] Dir: Darrick Penna. Evette Doffing, MD  Professional component performed at [facility], [facility] [address], [place], [place] [address] Lab: [phone] Dir: Dellia Nims. Rubinas, MD    . Glucose-Capillary 12/15/2015 151* 65 - 99 mg/dL Final  . Glucose-Capillary 12/16/2015 127* 65 - 99 mg/dL Final    Assessment:  Christina Sharp is a 70 y.o. 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  Bronchoscopy on 09/22/2015 revealed mild to moderate chronic bronchitis changes with no endobronchial lesions.  Brushings from the right lower lobe were negative for malignancy.  Repeat bronchoscopy on 11/22/2015 was performed.  An EBUS scope was unable to be passed in the RML.  Transbronchial needle aspirations of a lesion were performed in the medial segment of the right middle lobe.  Pathology revealed atypical cells.  CEA and LDH were normal on 11/23/2015.  PET scan on 11/21/2015 revealed a 2.9  x 3 cm central right lower lobe lung lesion (SUV 7.0).  Areas of surrounding more patchy right lower lobe opacity were also suspicious for metastasis or synchronous primaries. There were bilateral pulmonary nodules, including hypermetabolic right upper and right lower lobe pulmonary nodules suspicious for metastatic disease.  There was no thoracic nodal hypermetabolism.  There was an area of soft tissue fullness within the high left neck suspicious for an enlarged lateral retropharyngeal node (? skip nodal metastasis).  There was indeterminate right renal lesion.  Clinical stage is T3N0M1.  Head MRI on 12/31/2015 revealed atrophy and small vessel disease.  There were noo acute intracranial findings or intracranial metastatic disease.  There was suspected metastatic disease to a 1.2 x 2.3 cmleft lateral retropharyngeal node which displaced the left ICA medially.  She underwent video assisted thoracoscopy (VATS) on 12/14/2015.  On the undersurface of the right middle lobe there is a palpable mass.  Pathology from the right middle lobe wedge resection revealed adenocarcinoma, lepidic (80%), papillary (10%), and solid (10%).  Pleural biopsy was negative for malignancy.  Symptomatically, she is doing well.    Plan: 1.  Review head MRI. 2.  Discuss insurance denial of Foundation One.  Discuss driver mutation studies (EGFR, ALK, ROS1, BRAF, PDL-1). 3.  Discuss consideration of Keytruda + carboplatin and Alimta every 3 weeks for 4 cycles then Keytruda alone for 2 years.  Side effects of chemotherapy reviewed.  Review the data from the KEYNOTE-021 trial. Response rates were 55% compared to 29% with chemotherapy alone. Progression free survival was 13 months with the addition of Keytruda versus 8.9 months with chemotherapy alone. Median time to response was 1.5 months with Keytruda compared to 2.7 months with chemotherapy alone. 4.  B12 today (if approved). 5.  Call patient with results of mutation  testing. 6.  Follow-up based on testing (may need port, chemotherapy class).   Lequita Asal, MD  01/03/2016, 10:41 AM

## 2016-01-03 NOTE — Progress Notes (Signed)
Patient here today for MRI results.

## 2016-01-12 NOTE — Discharge Summary (Signed)
Physician Discharge Summary  Patient ID: Christina Sharp MRN: [id number] DOB/AGE: 71-Aug-1947 71 y.o.  Admit date: 12/14/2015 Discharge date: 01/12/2016   Discharge Diagnoses:  Active Problems:   Cancer of lower lobe of right lung [facility])   Procedures:Right VATS with lung biopsy   Hospital Course: She was admitted to the hospital following VATS right middle lobe lung biopsy for multiple pulmonary nodules.  She did well postop and had her chest tube removed without incident.  Final pathology did reveal adenocarcinoma.  Discharged to home to followup with myself and Dr. Mike Gip.  Disposition: 01-Home or Self Care  Discharge Instructions    Diet - low sodium heart healthy    Complete by:  As directed    Driving Restrictions    Complete by:  As directed    Do not drive while taking pain medication   Increase activity slowly    Complete by:  As directed    Lifting restrictions    Complete by:  As directed    Do not lift anything heavier than your dinner plate until seen in the office       Medication List    TAKE these medications   amLODipine 10 MG tablet Commonly known as:  NORVASC Take 10 mg by mouth daily.   aspirin 81 MG tablet Take 81 mg by mouth daily.   atorvastatin 20 MG tablet Commonly known as:  LIPITOR Take 20 mg by mouth at bedtime.   hydrALAZINE 50 MG tablet Commonly known as:  APRESOLINE Take 50 mg by mouth 3 (three) times daily.   HYDROcodone-acetaminophen 5-325 MG tablet Commonly known as:  NORCO/VICODIN Take 1-2 tablets by mouth every 4 (four) hours as needed for moderate pain.   metoprolol succinate 100 MG 24 hr tablet Commonly known as:  TOPROL-XL Take 100 mg by mouth daily. Take with or immediately following a meal.   sodium bicarbonate 650 MG tablet Take 1,300 mg by mouth 2 (two) times daily.   traMADol 50 MG tablet Commonly known as:  ULTRAM Take 1 tablet (50 mg total) by mouth every 12 (twelve) hours as needed.      Follow-up  Information    Nestor Lewandowsky, MD Follow up in 10 day(s).   Specialty:  Cardiothoracic Surgery Contact information: [place] [address] [phone]           Nestor Lewandowsky, MD

## 2016-01-16 ENCOUNTER — Encounter: Payer: Self-pay | Admitting: Hematology and Oncology

## 2016-01-18 ENCOUNTER — Encounter: Payer: Self-pay | Admitting: Hematology and Oncology

## 2016-01-19 ENCOUNTER — Other Ambulatory Visit: Payer: Self-pay | Admitting: Hematology and Oncology

## 2016-01-19 ENCOUNTER — Telehealth: Payer: Self-pay | Admitting: *Deleted

## 2016-01-19 ENCOUNTER — Encounter: Payer: Self-pay | Admitting: Hematology and Oncology

## 2016-01-19 ENCOUNTER — Other Ambulatory Visit: Payer: Self-pay | Admitting: *Deleted

## 2016-01-19 NOTE — Telephone Encounter (Signed)
Called patient and informed her that the testing was back and that she needed to get a port and start chemotherapy.  Patient informed that she has an appointment with Dr. Faith Rogue at [phone] on 01-19-16 tomorrow voiced understanding.

## 2016-01-19 NOTE — Progress Notes (Signed)
START ON PATHWAY REGIMEN - Non-Small Cell Lung  [id number]: Carboplatin AUC=5 + Pemetrexed 500 mg/m2 q21 Days x 4 Cycles   A cycle is every 21 days:     Pemetrexed (Alimta(R)) 500 mg/m2 in 100 mL NS IV over 10 minutes followed 30 minutes later by carboplatin, manufacturer recommends not administering to patients with CrCl < 45 mL/min Dose Mod: None     Carboplatin (Paraplatin(R)) AUC=5 in 250 mL NS IV over 1 hour Dose Mod: None Additional Orders: Note: Patient to receive the following prior to the initiation of therapy: 1) Dexamethasone 4 mg orally twice daily x 6 doses.  First dose 24 hours before chemotherapy. 2) Folic acid >= 915 mcg orally daily.  First dose at least 5 days prior to the first dose of pemetrexed. 3) Vitamin B12 1,000 mcg intramuscularly every 9 weeks.  First dose at least 5 days prior to the first dose of pemetrexed.  All AUC calculations intended to be used in [employer]  **Always confirm dose/schedule in your pharmacy ordering system**    Patient Characteristics: Stage IV Metastatic, Non Squamous, Initial Chemotherapy/Immunotherapy, PS = 0, 1, PD-L1 Expression Positive 1-49% (TPS) / Negative / Not Tested / Not a Candidate for Immunotherapy Check here if patient was staged using an edition prior to AJCC Staging - 8th Edition (i.e., prior to January 30, 2016)? false AJCC T Category: T3 Current Disease Status: Distant Metastases AJCC N Category: N0 AJCC M Category: M1b AJCC 8 Stage Grouping: IVA Histology: Non Squamous Cell ROS1 Rearrangement Status: Negative T790M Mutation Status: Not Applicable - EGFR Mutation Negative/Unknown Other Mutations/Biomarkers: No Other Actionable Mutations PD-L1 Expression Status: PD-L1 Positive 1-49% (TPS) Chemotherapy/Immunotherapy LOT: Initial Chemotherapy/Immunotherapy Molecular Targeted Therapy: Not Appropriate ALK Translocation Status: Negative Would you be surprised if this patient died  in the next year? I would be surprised  if this patient died in the next year EGFR Mutation Status: Negative/Wild Type BRAF V600E Mutation Status: Negative Performance Status: PS = 0, 1  Intent of Therapy: Non-Curative / Palliative Intent, Discussed with Patient

## 2016-01-20 ENCOUNTER — Telehealth: Payer: Self-pay | Admitting: *Deleted

## 2016-01-20 ENCOUNTER — Encounter: Payer: Self-pay | Admitting: Cardiothoracic Surgery

## 2016-01-20 ENCOUNTER — Ambulatory Visit (INDEPENDENT_AMBULATORY_CARE_PROVIDER_SITE_OTHER): Payer: BLUE CROSS/BLUE SHIELD | Admitting: Cardiothoracic Surgery

## 2016-01-20 VITALS — BP 148/80 | HR 78 | Temp 98.1°F | Resp 22 | Ht 64.0 in | Wt 149.8 lb

## 2016-01-20 DIAGNOSIS — R918 Other nonspecific abnormal finding of lung field: Secondary | ICD-10-CM | POA: Diagnosis not present

## 2016-01-20 NOTE — Telephone Encounter (Signed)
Called patient to verify port placement date, she reported that it was going to be on Jan 3rd 2018 with Dr. Genevive Bi.  Chemo Class scheduled on 01-23-17, pt voiced understanding of class and appt.

## 2016-01-20 NOTE — Progress Notes (Signed)
Patient ID: Christina Sharp, female   DOB: July 30, 1945, 70 y.o.   MRN: [id number]  Chief Complaint  Patient presents with  . Other    Discuss Port Placement    HPI Christina Sharp is a 70 y.o. female.  She had a recent diagnosis made of adenocarcinoma. She underwent a right-sided thoracoscopy about 6 weeks ago and she has recovered nicely from that. She has an occasional discomfort along her incisions but otherwise is getting along very well. She did see Dr. Mike Gip who is recommended that she have a Port-A-Cath inserted for chemotherapy. She comes in today to discuss that.   Past Medical History:  Diagnosis Date  . Cancer ([place])    basal cell   . Emphysema of lung ([place])   . Hypercholesteremia   . Hypertension   . Renal disorder     Past Surgical History:  Procedure Laterality Date  . BASAL CELL CARCINOMA EXCISION    . COLONOSCOPY    . ENDOBRONCHIAL ULTRASOUND N/A 11/22/2015   Procedure: ENDOBRONCHIAL ULTRASOUND;  Surgeon: Flora Lipps, MD;  Location: [facility] ORS;  Service: Cardiopulmonary;  Laterality: N/A;  . FLEXIBLE BRONCHOSCOPY N/A 09/22/2015   Procedure: FLEXIBLE BRONCHOSCOPY;  Surgeon: [name], MD;  Location: [facility] ORS;  Service: Pulmonary;  Laterality: N/A;  . VIDEO ASSISTED THORACOSCOPY (VATS)/THOROCOTOMY Right 12/14/2015   Procedure: VIDEO ASSISTED THORACOSCOPY (VATS)/THOROCOTOMY;  Surgeon: Nestor Lewandowsky, MD;  Location: [facility] ORS;  Service: Thoracic;  Laterality: Right;    Family History  Problem Relation Age of Onset  . Alzheimer's disease Mother   . Heart disease Father   . Heart attack Brother     Social History Social History  Substance Use Topics  . Smoking status: Former Smoker    Packs/day: 0.50    Years: 45.00    Types: Cigarettes    Quit date: 11/06/2015  . Smokeless tobacco: Never Used     Comment: hasn't smoked since 11-06-15  . Alcohol use Yes     Comment: occasional    Allergies  Allergen Reactions  . Augmentin [Amoxicillin-Pot Clavulanate]  Hives  . Penicillins Hives    Has patient had a PCN reaction causing immediate rash, facial/tongue/throat swelling, SOB or lightheadedness with hypotension: No Has patient had a PCN reaction causing severe rash involving mucus membranes or skin necrosis: No Has patient had a PCN reaction that required hospitalization No Has patient had a PCN reaction occurring within the last 10 years: Yes If all of the above answers are "NO", then may proceed with Cephalosporin use.      Current Outpatient Prescriptions  Medication Sig Dispense Refill  . amLODipine (NORVASC) 10 MG tablet Take 10 mg by mouth daily.    Marland Kitchen atorvastatin (LIPITOR) 20 MG tablet Take 20 mg by mouth at bedtime.     . folic acid (FOLVITE) 1 MG tablet Take 1 tablet (1 mg total) by mouth daily. 30 tablet 3  . hydrALAZINE (APRESOLINE) 50 MG tablet Take 50 mg by mouth 3 (three) times daily.    . metoprolol succinate (TOPROL-XL) 100 MG 24 hr tablet Take 100 mg by mouth daily. Take with or immediately following a meal.    . sodium bicarbonate 650 MG tablet Take 1,300 mg by mouth 2 (two) times daily.     . traMADol (ULTRAM) 50 MG tablet Take 1 tablet (50 mg total) by mouth every 12 (twelve) hours as needed. 30 tablet 0  . aspirin 81 MG tablet Take 81 mg by mouth daily.  No current facility-administered medications for this visit.     Location, Quality, Duration, Severity, Timing, Context, Modifying Factors, Associated Signs and Symptoms.  Review of Systems A complete review of systems was asked and was negative except for the following positive findings Occasional discomfort along her incisions.  Blood pressure (!) 148/80, pulse 78, temperature 98.1 F (36.7 C), temperature source Oral, resp. rate (!) 22, height '5\' 4"'$  (1.626 m), weight 149 lb 12.8 oz (67.9 kg), SpO2 96 %.  Physical Exam CONSTITUTIONAL:  Pleasant, well-developed, well-nourished, and in no acute distress. EYES: Pupils equal and reactive to light, Sclera  non-icteric EARS, NOSE, MOUTH AND THROAT:  The oropharynx was clear.  Dentition is good repair.  Oral mucosa pink and moist. LYMPH NODES:  Lymph nodes in the neck and axillae were normal RESPIRATORY:  Lungs were clear.  Normal respiratory effort without pathologic use of accessory muscles of respiration CARDIOVASCULAR: Heart was regular without murmurs.  There were no carotid bruits. GI: The abdomen was soft, nontender, and nondistended. There were no palpable masses. There was no hepatosplenomegaly. There were normal bowel sounds in all quadrants. MUSCULOSKELETAL:  Normal muscle strength and tone.  No clubbing or cyanosis.   SKIN:  There were no pathologic skin lesions.  There were no nodules on palpation. NEUROLOGIC:  Sensation is normal.  Cranial nerves are grossly intact. PSYCH:  Oriented to person, place and time.  Mood and affect are normal.  Data Reviewed   I have personally reviewed the patient's imaging and medical records.    Assessment    Metastatic adenocarcinoma the lung. She requires a Port-A-Cath for chemotherapy.    Plan    We will plan on placing the Port-A-Cath in early January. I explained her the indications and risks. Risks of bleeding, infection and pneumothorax were all reviewed. She would like [place] to proceed.       Nestor Lewandowsky, MD 01/20/2016, 9:38 AM

## 2016-01-24 ENCOUNTER — Inpatient Hospital Stay: Payer: BLUE CROSS/BLUE SHIELD

## 2016-01-24 ENCOUNTER — Other Ambulatory Visit: Payer: Self-pay | Admitting: *Deleted

## 2016-01-24 DIAGNOSIS — C3431 Malignant neoplasm of lower lobe, right bronchus or lung: Secondary | ICD-10-CM

## 2016-01-24 MED ORDER — DEXAMETHASONE 4 MG PO TABS: 4.0000 mg | ORAL_TABLET | Freq: Two times a day (BID) | ORAL | 2 refills | Status: AC

## 2016-01-24 NOTE — Patient Instructions (Signed)
Pembrolizumab injection What is this medicine? PEMBROLIZUMAB (pem broe liz ue mab) is a monoclonal antibody. It is used to treat melanoma, head and neck cancer, Hodgkin lymphoma, and non-small cell lung cancer. COMMON BRAND NAME(S): Keytruda What should I tell my health care provider before I take this medicine? They need to know if you have any of these conditions: -diabetes -immune system problems -inflammatory bowel disease -liver disease -lung or breathing disease -lupus -organ transplant -an unusual or allergic reaction to pembrolizumab, other medicines, foods, dyes, or preservatives -pregnant or trying to get pregnant -breast-feeding How should I use this medicine? This medicine is for infusion into a vein. It is given by a health care professional in a hospital or clinic setting. A special MedGuide will be given to you before each treatment. Be sure to read this information carefully each time. Talk to your pediatrician regarding the use of this medicine in children. While this drug may be prescribed for selected conditions, precautions do apply. What if I miss a dose? It is important not to miss your dose. Call your doctor or health care professional if you are unable to keep an appointment. What may interact with this medicine? Interactions have not been studied. Give your health care provider a list of all the medicines, herbs, non-prescription drugs, or dietary supplements you use. Also tell them if you smoke, drink alcohol, or use illegal drugs. Some items may interact with your medicine. What should I watch for while using this medicine? Your condition will be monitored carefully while you are receiving this medicine. You may need blood work done while you are taking this medicine. Do not become pregnant while taking this medicine or for 4 months after stopping it. Women should inform their doctor if they wish to become pregnant or think they might be pregnant. There is a  potential for serious side effects to an unborn child. Talk to your health care professional or pharmacist for more information. Do not breast-feed an infant while taking this medicine or for 4 months after the last dose. What side effects may I notice from receiving this medicine? Side effects that you should report to your doctor or health care professional as soon as possible: -allergic reactions like skin rash, itching or hives, swelling of the face, lips, or tongue -bloody or black, tarry -breathing problems -changes in vision -chest pain -chills -constipation -cough -dizziness or feeling faint or lightheaded -fast or irregular heartbeat -fever -flushing -hair loss -low blood counts - this medicine may decrease the number of white blood cells, red blood cells and platelets. You may be at increased risk for infections and bleeding. -muscle pain -muscle weakness -persistent headache -signs and symptoms of high blood sugar such as dizziness; dry mouth; dry skin; fruity breath; nausea; stomach pain; increased hunger or thirst; increased urination -signs and symptoms of kidney injury like trouble passing urine or change in the amount of urine -signs and symptoms of liver injury like dark urine, light-colored stools, loss of appetite, nausea, right upper belly pain, yellowing of the eyes or skin -stomach pain -sweating -weight loss Side effects that usually do not require medical attention (report to your doctor or health care professional if they continue or are bothersome): -decreased appetite -diarrhea -tiredness Where should I keep my medicine? This drug is given in a hospital or clinic and will not be stored at home.  2017 Elsevier/Gold Standard (2015-04-18 19:28:44) Pemetrexed injection What is this medicine? PEMETREXED (PEM e TREX ed) is a chemotherapy drug.  This medicine affects cells that are rapidly growing, such as cancer cells and cells in your mouth and stomach. It is  usually used to treat lung cancers like non-small cell lung cancer and mesothelioma. It may also be used to treat other cancers. This medicine may be used for other purposes; ask your health care provider or pharmacist if you have questions. COMMON BRAND NAME(S): Alimta What should I tell my health care provider before I take this medicine? They need to know if you have any of these conditions: -if you frequently drink alcohol containing beverages -infection (especially a virus infection such as chickenpox, cold sores, or herpes) -kidney disease -liver disease -low blood counts, like low platelets, red bloods, or white blood cells -an unusual or allergic reaction to pemetrexed, mannitol, other medicines, foods, dyes, or preservatives -pregnant or trying to get pregnant -breast-feeding How should I use this medicine? This drug is given as an infusion into a vein. It is administered in a hospital or clinic by a specially trained health care professional. Talk to your pediatrician regarding the use of this medicine in children. Special care may be needed. Overdosage: If you think you have taken too much of this medicine contact a poison control center or emergency room at once. NOTE: This medicine is only for you. Do not share this medicine with others. What if I miss a dose? It is important not to miss your dose. Call your doctor or health care professional if you are unable to keep an appointment. What may interact with this medicine? -aspirin and aspirin-like medicines -medicines to increase blood counts like filgrastim, pegfilgrastim, sargramostim -methotrexate -NSAIDS, medicines for pain and inflammation, like ibuprofen or naproxen -probenecid -pyrimethamine -vaccines Talk to your doctor or health care professional before taking any of these medicines: -acetaminophen -aspirin -ibuprofen -ketoprofen -naproxen This list may not describe all possible interactions. Give your health  care provider a list of all the medicines, herbs, non-prescription drugs, or dietary supplements you use. Also tell them if you smoke, drink alcohol, or use illegal drugs. Some items may interact with your medicine. What should I watch for while using this medicine? Visit your doctor for checks on your progress. This drug may make you feel generally unwell. This is not uncommon, as chemotherapy can affect healthy cells as well as cancer cells. Report any side effects. Continue your course of treatment even though you feel ill unless your doctor tells you to stop. In some cases, you may be given additional medicines to help with side effects. Follow all directions for their use. Call your doctor or health care professional for advice if you get a fever, chills or sore throat, or other symptoms of a cold or flu. Do not treat yourself. This drug decreases your body's ability to fight infections. Try to avoid being around people who are sick. This medicine may increase your risk to bruise or bleed. Call your doctor or health care professional if you notice any unusual bleeding. Be careful brushing and flossing your teeth or using a toothpick because you may get an infection or bleed more easily. If you have any dental work done, tell your dentist you are receiving this medicine. Avoid taking products that contain aspirin, acetaminophen, ibuprofen, naproxen, or ketoprofen unless instructed by your doctor. These medicines may hide a fever. Call your doctor or health care professional if you get diarrhea or mouth sores. Do not treat yourself. To protect your kidneys, drink water or other fluids as directed while you  are taking this medicine. Men and women must use effective birth control while taking this medicine. You may also need to continue using effective birth control for a time after stopping this medicine. Do not become pregnant while taking this medicine. Tell your doctor right away if you think that you  or your partner might be pregnant. There is a potential for serious side effects to an unborn child. Talk to your health care professional or pharmacist for more information. Do not breast-feed an infant while taking this medicine. This medicine may lower sperm counts. What side effects may I notice from receiving this medicine? Side effects that you should report to your doctor or health care professional as soon as possible: -allergic reactions like skin rash, itching or hives, swelling of the face, lips, or tongue -low blood counts - this medicine may decrease the number of white blood cells, red blood cells and platelets. You may be at increased risk for infections and bleeding. -signs of infection - fever or chills, cough, sore throat, pain or difficulty passing urine -signs of decreased platelets or bleeding - bruising, pinpoint red spots on the skin, black, tarry stools, blood in the urine -signs of decreased red blood cells - unusually weak or tired, fainting spells, lightheadedness -breathing problems, like a dry cough -changes in emotions or moods -chest pain -confusion -diarrhea -high blood pressure -mouth or throat sores or ulcers -pain, swelling, warmth in the leg -pain on swallowing -swelling of the ankles, feet, hands -trouble passing urine or change in the amount of urine -vomiting -yellowing of the eyes or skin Side effects that usually do not require medical attention (report to your doctor or health care professional if they continue or are bothersome): -hair loss -loss of appetite -nausea -stomach upset This list may not describe all possible side effects. Call your doctor for medical advice about side effects. You may report side effects to FDA at 1-800-FDA-1088. Where should I keep my medicine? This drug is given in a hospital or clinic and will not be stored at home. NOTE: This sheet is a summary. It may not cover all possible information. If you have questions  about this medicine, talk to your doctor, pharmacist, or health care provider.  2017 Elsevier/Gold Standard (2007-08-19 13:24:03) Carboplatin injection What is this medicine? CARBOPLATIN (KAR boe pla tin) is a chemotherapy drug. It targets fast dividing cells, like cancer cells, and causes these cells to die. This medicine is used to treat ovarian cancer and many other cancers. This medicine may be used for other purposes; ask your health care provider or pharmacist if you have questions. COMMON BRAND NAME(S): Paraplatin What should I tell my health care provider before I take this medicine? They need to know if you have any of these conditions: -blood disorders -hearing problems -kidney disease -recent or ongoing radiation therapy -an unusual or allergic reaction to carboplatin, cisplatin, other chemotherapy, other medicines, foods, dyes, or preservatives -pregnant or trying to get pregnant -breast-feeding How should I use this medicine? This drug is usually given as an infusion into a vein. It is administered in a hospital or clinic by a specially trained health care professional. Talk to your pediatrician regarding the use of this medicine in children. Special care may be needed. Overdosage: If you think you have taken too much of this medicine contact a poison control center or emergency room at once. NOTE: This medicine is only for you. Do not share this medicine with others. What if I miss  a dose? It is important not to miss a dose. Call your doctor or health care professional if you are unable to keep an appointment. What may interact with this medicine? -medicines for seizures -medicines to increase blood counts like filgrastim, pegfilgrastim, sargramostim -some antibiotics like amikacin, gentamicin, neomycin, streptomycin, tobramycin -vaccines Talk to your doctor or health care professional before taking any of these  medicines: -acetaminophen -aspirin -ibuprofen -ketoprofen -naproxen This list may not describe all possible interactions. Give your health care provider a list of all the medicines, herbs, non-prescription drugs, or dietary supplements you use. Also tell them if you smoke, drink alcohol, or use illegal drugs. Some items may interact with your medicine. What should I watch for while using this medicine? Your condition will be monitored carefully while you are receiving this medicine. You will need important blood work done while you are taking this medicine. This drug may make you feel generally unwell. This is not uncommon, as chemotherapy can affect healthy cells as well as cancer cells. Report any side effects. Continue your course of treatment even though you feel ill unless your doctor tells you to stop. In some cases, you may be given additional medicines to help with side effects. Follow all directions for their use. Call your doctor or health care professional for advice if you get a fever, chills or sore throat, or other symptoms of a cold or flu. Do not treat yourself. This drug decreases your body's ability to fight infections. Try to avoid being around people who are sick. This medicine may increase your risk to bruise or bleed. Call your doctor or health care professional if you notice any unusual bleeding. Be careful brushing and flossing your teeth or using a toothpick because you may get an infection or bleed more easily. If you have any dental work done, tell your dentist you are receiving this medicine. Avoid taking products that contain aspirin, acetaminophen, ibuprofen, naproxen, or ketoprofen unless instructed by your doctor. These medicines may hide a fever. Do not become pregnant while taking this medicine. Women should inform their doctor if they wish to become pregnant or think they might be pregnant. There is a potential for serious side effects to an unborn child. Talk to  your health care professional or pharmacist for more information. Do not breast-feed an infant while taking this medicine. What side effects may I notice from receiving this medicine? Side effects that you should report to your doctor or health care professional as soon as possible: -allergic reactions like skin rash, itching or hives, swelling of the face, lips, or tongue -signs of infection - fever or chills, cough, sore throat, pain or difficulty passing urine -signs of decreased platelets or bleeding - bruising, pinpoint red spots on the skin, black, tarry stools, nosebleeds -signs of decreased red blood cells - unusually weak or tired, fainting spells, lightheadedness -breathing problems -changes in hearing -changes in vision -chest pain -high blood pressure -low blood counts - This drug may decrease the number of white blood cells, red blood cells and platelets. You may be at increased risk for infections and bleeding. -nausea and vomiting -pain, swelling, redness or irritation at the injection site -pain, tingling, numbness in the hands or feet -problems with balance, talking, walking -trouble passing urine or change in the amount of urine Side effects that usually do not require medical attention (report to your doctor or health care professional if they continue or are bothersome): -hair loss -loss of appetite -metallic taste in  the mouth or changes in taste This list may not describe all possible side effects. Call your doctor for medical advice about side effects. You may report side effects to FDA at 1-800-FDA-1088. Where should I keep my medicine? This drug is given in a hospital or clinic and will not be stored at home. NOTE: This sheet is a summary. It may not cover all possible information. If you have questions about this medicine, talk to your doctor, pharmacist, or health care provider.  2017 Elsevier/Gold Standard (2007-04-22 14:38:05)

## 2016-01-25 ENCOUNTER — Telehealth: Payer: Self-pay

## 2016-01-25 NOTE — Telephone Encounter (Signed)
Pt advised of pre op date/time and sx date. Sx: 02/01/16 with Dr Rolley Sims Placement.  Pre op: 01/27/16 @ 9:00am- Office.   Patient made aware to call [phone], between 1-3:00pm the day before surgery, to find out what time to arrive.

## 2016-01-25 NOTE — Telephone Encounter (Signed)
Patient says she had a missed phone call from Dr. Genevive Bi (our office) around 8:36 am this morning. I looked in her chart and did not see a note. Patient claims it might be information concerning her upcoming surgery. Please call patient and advice.

## 2016-01-26 ENCOUNTER — Encounter
Admission: RE | Admit: 2016-01-26 | Discharge: 2016-01-26 | Disposition: A | Discharge status: Home / Self Care | Payer: BLUE CROSS/BLUE SHIELD | Source: Ambulatory Visit | Attending: Cardiothoracic Surgery | Admitting: Cardiothoracic Surgery

## 2016-01-26 ENCOUNTER — Inpatient Hospital Stay: Payer: BLUE CROSS/BLUE SHIELD

## 2016-01-26 ENCOUNTER — Other Ambulatory Visit: Payer: BLUE CROSS/BLUE SHIELD

## 2016-01-26 DIAGNOSIS — J449 Chronic obstructive pulmonary disease, unspecified: Secondary | ICD-10-CM | POA: Diagnosis not present

## 2016-01-26 DIAGNOSIS — Z01810 Encounter for preprocedural cardiovascular examination: Secondary | ICD-10-CM | POA: Insufficient documentation

## 2016-01-26 DIAGNOSIS — Z01812 Encounter for preprocedural laboratory examination: Secondary | ICD-10-CM | POA: Diagnosis present

## 2016-01-26 HISTORY — DX: Other nonspecific abnormal finding of lung field: R91.8

## 2016-01-26 LAB — CBC
HEMATOCRIT: 40.4 % (ref 35.0–47.0)
HEMOGLOBIN: 13.3 g/dL (ref 12.0–16.0)
MCH: 29.4 pg (ref 26.0–34.0)
MCHC: 32.9 g/dL (ref 32.0–36.0)
MCV: 89.4 fL (ref 80.0–100.0)
Platelets: 561 10*3/uL — ABNORMAL HIGH (ref 150–440)
RBC: 4.52 MIL/uL (ref 3.80–5.20)
RDW: 14.1 % (ref 11.5–14.5)
WBC: 9.9 10*3/uL (ref 3.6–11.0)

## 2016-01-26 LAB — PROTIME-INR
INR: 0.8
Prothrombin Time: 11 seconds — ABNORMAL LOW (ref 11.4–15.2)

## 2016-01-26 LAB — URINALYSIS, ROUTINE W REFLEX MICROSCOPIC
Bilirubin Urine: NEGATIVE
Glucose, UA: NEGATIVE mg/dL
Ketones, ur: NEGATIVE mg/dL
Nitrite: NEGATIVE
PROTEIN: NEGATIVE mg/dL
SPECIFIC GRAVITY, URINE: 1.01 (ref 1.005–1.030)
pH: 7 (ref 5.0–8.0)

## 2016-01-26 LAB — COMPREHENSIVE METABOLIC PANEL
ALK PHOS: 95 U/L (ref 38–126)
ALT: 14 U/L (ref 14–54)
ANION GAP: 9 (ref 5–15)
AST: 24 U/L (ref 15–41)
Albumin: 3.8 g/dL (ref 3.5–5.0)
BUN: 10 mg/dL (ref 6–20)
CALCIUM: 9.3 mg/dL (ref 8.9–10.3)
CO2: 26 mmol/L (ref 22–32)
CREATININE: 0.8 mg/dL (ref 0.44–1.00)
Chloride: 105 mmol/L (ref 101–111)
GFR calc Af Amer: >60 mL/min (ref >60)
GFR calc non Af Amer: >60 mL/min (ref >60)
GLUCOSE: 84 mg/dL (ref 65–99)
Potassium: 3.8 mmol/L (ref 3.5–5.1)
Sodium: 140 mmol/L (ref 135–145)
TOTAL PROTEIN: 7.5 g/dL (ref 6.5–8.1)
Total Bilirubin: 0.5 mg/dL (ref 0.3–1.2)

## 2016-01-26 LAB — APTT: aPTT: 29 seconds (ref 24–36)

## 2016-01-26 NOTE — Patient Instructions (Addendum)
  Your procedure is scheduled on: 02/01/16 Wed Report to Same Day Surgery 2nd floor medical mall [facility] Entrance-take elevator on left to 2nd floor.  Check in with surgery information desk.) To find out your arrival time please call [phone] between 1PM - 3PM on 01/31/16 Tues  Remember: Instructions that are not followed completely may result in serious medical risk, up to and including death, or upon the discretion of your surgeon and anesthesiologist your surgery may need to be rescheduled.    _x___ 1. Do not eat food or drink liquids after midnight. No gum chewing or hard candies.     __x__ 2. No Alcohol for 24 hours before or after surgery.   __x__3. No Smoking for 24 prior to surgery.   ____  4. Bring all medications with you on the day of surgery if instructed.    __x__ 5. Notify your doctor if there is any change in your medical condition     (cold, fever, infections).     Do not wear jewelry, make-up, hairpins, clips or nail polish.  Do not wear lotions, powders, or perfumes. You may wear deodorant.  Do not shave 48 hours prior to surgery. Men may shave face and neck.  Do not bring valuables to the hospital.    [facility] is not responsible for any belongings or valuables.               Contacts, dentures or bridgework may not be worn into surgery.  Leave your suitcase in the car. After surgery it may be brought to your room.  For patients admitted to the hospital, discharge time is determined by your treatment team.   Patients discharged the day of surgery will not be allowed to drive home.  You will need someone to drive you home and stay with you the night of your procedure.    Please read over the following fact sheets that you were given:   [facility] Preparing for Surgery and or MRSA Information   _x___ Take these medicines the morning of surgery with A SIP OF WATER:    1. amLODipine (NORVASC)  2.hydrALAZINE (APRESOLINE  3.metoprolol succinate    4.  5.  6.  ____Fleets enema or Magnesium Citrate as directed.   _x___ Use CHG Soap or sage wipes as directed on instruction sheet   ____ Use inhalers on the day of surgery and bring to hospital day of surgery  ____ Stop metformin 2 days prior to surgery    ____ Take 1/2 of usual insulin dose the night before surgery and none on the morning of           surgery.   _x___ Stop Aspirin, Coumadin, Pllavix ,Eliquis, Effient, or Pradaxa  Stopped Aspirin 2 weeks ago  x__ Stop Anti-inflammatories such as Advil, Aleve, Ibuprofen, Motrin, Naproxen,          Naprosyn, Goodies powders or aspirin products. Ok to take Tylenol.   ____ Stop supplements until after surgery.    ____ Bring C-Pap to the hospital.   __X___To return on Tues for chest x-ray.

## 2016-01-27 ENCOUNTER — Telehealth: Payer: Self-pay

## 2016-01-27 ENCOUNTER — Inpatient Hospital Stay: Admission: RE | Admit: 2016-01-27 | Payer: BLUE CROSS/BLUE SHIELD | Source: Ambulatory Visit

## 2016-01-27 NOTE — Telephone Encounter (Signed)
Reviewed patient's pre-op lab results at this time. No further orders at this time. Continue with port placement as scheduled.

## 2016-01-27 NOTE — Pre-Procedure Instructions (Signed)
Platelets, PT, & UA sent to Dr. Genevive Bi and Anesthesia for review.

## 2016-01-31 ENCOUNTER — Telehealth: Payer: Self-pay | Admitting: *Deleted

## 2016-01-31 ENCOUNTER — Ambulatory Visit
Admission: RE | Admit: 2016-01-31 | Discharge: 2016-01-31 | Disposition: A | Discharge status: Home / Self Care | Payer: BLUE CROSS/BLUE SHIELD | Source: Ambulatory Visit | Attending: Cardiothoracic Surgery | Admitting: Cardiothoracic Surgery

## 2016-01-31 DIAGNOSIS — R918 Other nonspecific abnormal finding of lung field: Secondary | ICD-10-CM | POA: Diagnosis not present

## 2016-01-31 DIAGNOSIS — C3490 Malignant neoplasm of unspecified part of unspecified bronchus or lung: Secondary | ICD-10-CM | POA: Diagnosis not present

## 2016-01-31 LAB — SURGICAL PATHOLOGY

## 2016-01-31 NOTE — Pre-Procedure Instructions (Signed)
CXR sent to Dr. Genevive Bi and Anesthesia for review.

## 2016-02-01 ENCOUNTER — Ambulatory Visit: Payer: BLUE CROSS/BLUE SHIELD | Admitting: Anesthesiology

## 2016-02-01 ENCOUNTER — Encounter
Admission: RE | Disposition: A | Discharge status: Home / Self Care | Payer: Self-pay | Source: Ambulatory Visit | Attending: Cardiothoracic Surgery

## 2016-02-01 ENCOUNTER — Ambulatory Visit
Admission: RE | Admit: 2016-02-01 | Discharge: 2016-02-01 | Disposition: A | Discharge status: Home / Self Care | Payer: BLUE CROSS/BLUE SHIELD | Source: Ambulatory Visit | Attending: Cardiothoracic Surgery | Admitting: Cardiothoracic Surgery

## 2016-02-01 ENCOUNTER — Ambulatory Visit: Payer: BLUE CROSS/BLUE SHIELD

## 2016-02-01 ENCOUNTER — Encounter: Payer: Self-pay | Admitting: Cardiothoracic Surgery

## 2016-02-01 DIAGNOSIS — R918 Other nonspecific abnormal finding of lung field: Secondary | ICD-10-CM

## 2016-02-01 DIAGNOSIS — Z880 Allergy status to penicillin: Secondary | ICD-10-CM | POA: Insufficient documentation

## 2016-02-01 DIAGNOSIS — Z8249 Family history of ischemic heart disease and other diseases of the circulatory system: Secondary | ICD-10-CM | POA: Insufficient documentation

## 2016-02-01 DIAGNOSIS — Z79899 Other long term (current) drug therapy: Secondary | ICD-10-CM | POA: Diagnosis not present

## 2016-02-01 DIAGNOSIS — J439 Emphysema, unspecified: Secondary | ICD-10-CM | POA: Diagnosis not present

## 2016-02-01 DIAGNOSIS — Z09 Encounter for follow-up examination after completed treatment for conditions other than malignant neoplasm: Secondary | ICD-10-CM

## 2016-02-01 DIAGNOSIS — I10 Essential (primary) hypertension: Secondary | ICD-10-CM | POA: Insufficient documentation

## 2016-02-01 DIAGNOSIS — Z452 Encounter for adjustment and management of vascular access device: Secondary | ICD-10-CM | POA: Diagnosis not present

## 2016-02-01 DIAGNOSIS — Z87891 Personal history of nicotine dependence: Secondary | ICD-10-CM | POA: Insufficient documentation

## 2016-02-01 DIAGNOSIS — E7800 Pure hypercholesterolemia, unspecified: Secondary | ICD-10-CM | POA: Diagnosis not present

## 2016-02-01 DIAGNOSIS — Z7982 Long term (current) use of aspirin: Secondary | ICD-10-CM | POA: Diagnosis not present

## 2016-02-01 DIAGNOSIS — C7801 Secondary malignant neoplasm of right lung: Secondary | ICD-10-CM | POA: Insufficient documentation

## 2016-02-01 DIAGNOSIS — Z818 Family history of other mental and behavioral disorders: Secondary | ICD-10-CM | POA: Insufficient documentation

## 2016-02-01 DIAGNOSIS — Z85828 Personal history of other malignant neoplasm of skin: Secondary | ICD-10-CM | POA: Insufficient documentation

## 2016-02-01 DIAGNOSIS — Z9889 Other specified postprocedural states: Secondary | ICD-10-CM | POA: Diagnosis not present

## 2016-02-01 HISTORY — PX: PORTACATH PLACEMENT: SHX2246

## 2016-02-01 SURGERY — INSERTION, TUNNELED CENTRAL VENOUS DEVICE, WITH PORT
Anesthesia: General | Site: Chest | Laterality: Right | Wound class: Clean

## 2016-02-01 MED ORDER — LIDOCAINE HCL (PF) 1 % IJ SOLN
INTRAMUSCULAR | Status: AC
  Filled 2016-02-01: qty 30

## 2016-02-01 MED ORDER — VANCOMYCIN HCL IN DEXTROSE 1-5 GM/200ML-% IV SOLN
1000.0000 mg | INTRAVENOUS | Status: AC
  Administered 2016-02-01: 1000 mg via INTRAVENOUS

## 2016-02-01 MED ORDER — LIDOCAINE HCL 1 % IJ SOLN
INTRAMUSCULAR | Status: DC | PRN
  Administered 2016-02-01: 6 mL

## 2016-02-01 MED ORDER — HEPARIN SODIUM (PORCINE) 5000 UNIT/ML IJ SOLN
INTRAMUSCULAR | Status: AC
  Filled 2016-02-01: qty 1

## 2016-02-01 MED ORDER — PROPOFOL 10 MG/ML IV BOLUS
INTRAVENOUS | Status: AC
Start: 2016-02-01 — End: 2016-02-01
  Filled 2016-02-01: qty 20

## 2016-02-01 MED ORDER — FENTANYL CITRATE (PF) 100 MCG/2ML IJ SOLN
INTRAMUSCULAR | Status: AC
  Filled 2016-02-01: qty 2

## 2016-02-01 MED ORDER — FAMOTIDINE 20 MG PO TABS
20.0000 mg | ORAL_TABLET | Freq: Once | ORAL | Status: AC
  Administered 2016-02-01: 20 mg via ORAL

## 2016-02-01 MED ORDER — LIDOCAINE 2% (20 MG/ML) 5 ML SYRINGE
INTRAMUSCULAR | Status: AC
  Filled 2016-02-01: qty 5

## 2016-02-01 MED ORDER — ONDANSETRON HCL 4 MG/2ML IJ SOLN: 4.0000 mg | Freq: Once | INTRAMUSCULAR | Status: DC | PRN

## 2016-02-01 MED ORDER — LACTATED RINGERS IV SOLN
INTRAVENOUS | Status: DC
  Administered 2016-02-01: 09:00:00 via INTRAVENOUS

## 2016-02-01 MED ORDER — LIDOCAINE HCL (CARDIAC) 20 MG/ML IV SOLN
INTRAVENOUS | Status: DC | PRN
  Administered 2016-02-01: 80 mg via INTRAVENOUS

## 2016-02-01 MED ORDER — PROPOFOL 10 MG/ML IV BOLUS
INTRAVENOUS | Status: DC | PRN
  Administered 2016-02-01: 150 mg via INTRAVENOUS

## 2016-02-01 MED ORDER — FENTANYL CITRATE (PF) 100 MCG/2ML IJ SOLN
INTRAMUSCULAR | Status: DC | PRN
  Administered 2016-02-01: 25 ug via INTRAVENOUS
  Administered 2016-02-01: 50 ug via INTRAVENOUS
  Administered 2016-02-01 (×3): 25 ug via INTRAVENOUS

## 2016-02-01 MED ORDER — MIDAZOLAM HCL 2 MG/2ML IJ SOLN
INTRAMUSCULAR | Status: AC
  Filled 2016-02-01: qty 2

## 2016-02-01 MED ORDER — PHENYLEPHRINE HCL 10 MG/ML IJ SOLN
INTRAMUSCULAR | Status: AC
  Filled 2016-02-01: qty 1

## 2016-02-01 MED ORDER — SODIUM CHLORIDE 0.9 % IJ SOLN
INTRAMUSCULAR | Status: AC
  Filled 2016-02-01: qty 50

## 2016-02-01 MED ORDER — FAMOTIDINE 20 MG PO TABS
ORAL_TABLET | ORAL | Status: AC
  Administered 2016-02-01: 20 mg via ORAL
  Filled 2016-02-01: qty 1

## 2016-02-01 MED ORDER — ONDANSETRON HCL 4 MG/2ML IJ SOLN
INTRAMUSCULAR | Status: AC
  Filled 2016-02-01: qty 2

## 2016-02-01 MED ORDER — FENTANYL CITRATE (PF) 100 MCG/2ML IJ SOLN
INTRAMUSCULAR | Status: AC
Start: 2016-02-01 — End: 2016-02-01
  Filled 2016-02-01: qty 2

## 2016-02-01 MED ORDER — MIDAZOLAM HCL 2 MG/2ML IJ SOLN
INTRAMUSCULAR | Status: DC | PRN
  Administered 2016-02-01: 2 mg via INTRAVENOUS

## 2016-02-01 MED ORDER — PHENYLEPHRINE HCL 10 MG/ML IJ SOLN
INTRAMUSCULAR | Status: DC | PRN
  Administered 2016-02-01 (×4): 100 ug via INTRAVENOUS

## 2016-02-01 MED ORDER — ONDANSETRON HCL 4 MG/2ML IJ SOLN
INTRAMUSCULAR | Status: DC | PRN
  Administered 2016-02-01: 4 mg via INTRAVENOUS

## 2016-02-01 MED ORDER — FENTANYL CITRATE (PF) 100 MCG/2ML IJ SOLN
25.0000 ug | INTRAMUSCULAR | Status: DC | PRN
  Administered 2016-02-01 (×4): 25 ug via INTRAVENOUS

## 2016-02-01 MED ORDER — EPHEDRINE SULFATE 50 MG/ML IJ SOLN
INTRAMUSCULAR | Status: DC | PRN
  Administered 2016-02-01: 10 mg via INTRAVENOUS

## 2016-02-01 MED ORDER — EPHEDRINE 5 MG/ML INJ
INTRAVENOUS | Status: AC
  Filled 2016-02-01: qty 10

## 2016-02-01 SURGICAL SUPPLY — 37 items
BAG DECANTER FOR FLEXI CONT (MISCELLANEOUS) ×2 IMPLANT
BLADE SURG SZ11 CARB STEEL (BLADE) ×2 IMPLANT
CANISTER SUCT 1200ML W/VALVE (MISCELLANEOUS) ×2 IMPLANT
CHLORAPREP W/TINT 26ML (MISCELLANEOUS) ×2 IMPLANT
COVER LIGHT HANDLE STERIS (MISCELLANEOUS) ×4 IMPLANT
DRAPE C-ARM XRAY 36X54 (DRAPES) ×2 IMPLANT
DRESSING TELFA 4X3 1S ST N-ADH (GAUZE/BANDAGES/DRESSINGS) ×2 IMPLANT
DRSG TEGADERM 2-3/8X2-3/4 SM (GAUZE/BANDAGES/DRESSINGS) ×2 IMPLANT
DRSG TEGADERM 4X4.75 (GAUZE/BANDAGES/DRESSINGS) ×2 IMPLANT
ELECT CAUTERY BLADE TIP 2.5 (TIP) ×2
ELECT REM PT RETURN 9FT ADLT (ELECTROSURGICAL) ×2
ELECTRODE CAUTERY BLDE TIP 2.5 (TIP) ×1 IMPLANT
ELECTRODE REM PT RTRN 9FT ADLT (ELECTROSURGICAL) ×1 IMPLANT
GLOVE SURG SYN 7.5  E (GLOVE) ×6
GLOVE SURG SYN 7.5 E (GLOVE) ×6 IMPLANT
GOWN STRL REUS W/ TWL LRG LVL3 (GOWN DISPOSABLE) ×4 IMPLANT
GOWN STRL REUS W/TWL LRG LVL3 (GOWN DISPOSABLE) ×4
IV NS 500ML (IV SOLUTION) ×1
IV NS 500ML BAXH (IV SOLUTION) ×1 IMPLANT
KIT PORT POWER 8FR ISP CVUE (Catheter) ×2 IMPLANT
KIT RM TURNOVER STRD PROC AR (KITS) ×2 IMPLANT
LABEL OR SOLS (LABEL) ×2 IMPLANT
MARKER SKIN DUAL TIP RULER LAB (MISCELLANEOUS) ×2 IMPLANT
NDL SAFETY 22GX1.5 (NEEDLE) ×2 IMPLANT
NEEDLE FILTER BLUNT 18X 1/2SAF (NEEDLE) ×1
NEEDLE FILTER BLUNT 18X1 1/2 (NEEDLE) ×1 IMPLANT
NS IRRIG 500ML POUR BTL (IV SOLUTION) ×2 IMPLANT
PACK PORT-A-CATH (MISCELLANEOUS) ×2 IMPLANT
SUT ETHILON 4-0 (SUTURE) ×2
SUT ETHILON 4-0 FS2 18XMFL BLK (SUTURE) ×2
SUT PROLENE 2 0 SH DA (SUTURE) ×4 IMPLANT
SUT VIC AB 3-0 SH 27 (SUTURE) ×1
SUT VIC AB 3-0 SH 27X BRD (SUTURE) ×1 IMPLANT
SUTURE ETHLN 4-0 FS2 18XMF BLK (SUTURE) ×2 IMPLANT
SYR 3ML LL SCALE MARK (SYRINGE) ×2 IMPLANT
SYRINGE 10CC LL (SYRINGE) ×2 IMPLANT
TAPE TRANSPORE STRL 2 31045 (GAUZE/BANDAGES/DRESSINGS) ×2 IMPLANT

## 2016-02-01 NOTE — Transfer of Care (Signed)
Immediate Anesthesia Transfer of Care Note  Patient: Christina Sharp  Procedure(s) Performed: Procedure(s) with comments: INSERTION PORT-A-CATH (Right) - Right internal jugular vein  Patient Location: PACU  Anesthesia Type:General  Level of Consciousness: sedated  Airway & Oxygen Therapy: Patient Spontanous Breathing and Patient connected to face mask oxygen  Post-op Assessment: Report given to RN and Post -op Vital signs reviewed and stable  Post vital signs: Reviewed and stable  Last Vitals:  Vitals:   02/01/16 0816 02/01/16 1050  BP: (!) 142/77 132/78  Pulse: 82 65  Resp: 20 11  Temp: 36.4 C 36.5 C    Last Pain:  Vitals:   02/01/16 1050  TempSrc:   PainSc: Asleep         Complications: No apparent anesthesia complications

## 2016-02-01 NOTE — Anesthesia Procedure Notes (Signed)
Procedure Name: LMA Insertion Date/Time: 02/01/2016 9:16 AM Performed by: Hedda Slade Pre-anesthesia Checklist: Patient identified, Emergency Drugs available, Suction available and Patient being monitored Patient Re-evaluated:Patient Re-evaluated prior to inductionOxygen Delivery Method: Circle system utilized Preoxygenation: Pre-oxygenation with 100% oxygen Intubation Type: IV induction Ventilation: Mask ventilation without difficulty LMA: LMA inserted LMA Size: 3.5 Number of attempts: 1 Placement Confirmation: positive ETCO2 and breath sounds checked- equal and bilateral Tube secured with: Tape Dental Injury: Teeth and Oropharynx as per pre-operative assessment

## 2016-02-01 NOTE — Op Note (Signed)
02/01/2016  10:55 AM  PATIENT:  Rodney Cruise  71 y.o. female  PRE-OPERATIVE DIAGNOSIS:  lung mass  POST-OPERATIVE DIAGNOSIS:  lung mass  PROCEDURE:  Procedure(s) with comments: INSERTION PORT-A-CATH (Right) - Right internal jugular vein  SURGEON:  Surgeon(s) and Role:    * Nestor Lewandowsky, MD - Primary  ASSISTANTS:  Doristine Mango PAS, Edison Simon PAS     ANESTHESIA: General   DICTATION:   The patient was brought to the operating suite and placed in the supine position. Using the ultrasound machine the Right Internal Jugular Vein was identified and suitable for cannulation. The patient was then prepped and draped in usual sterile fashion. The Right IJ was percutaneously catheterized. A wire was placed into the venous system under fluoroscopic guidance. An appropriate site was selected on the chest wall and a Port-A-Cath pocket was created. The catheter was tunneled from the port site up to the insertion site. The catheter was then inserted through a peel-away sheath and positioned at the appropriate level in the superior vena cava. The catheter was then assembled and aspirated and flushed easily. It was then secured to the anterior chest wall with interrupted Prolene sutures. The catheter was flushed one last time and the wounds were then closed. The subcutaneous tissues were closed with running absorbable sutures and the skin with nylon. Sterile dressings were applied. Patient was then transported to the recovery room in stable condition.   Nestor Lewandowsky, MD

## 2016-02-01 NOTE — Anesthesia Preprocedure Evaluation (Signed)
Anesthesia Evaluation  Patient identified by MRN, date of birth, ID band Patient awake    Reviewed: Allergy & Precautions, NPO status , Patient's Chart, lab work & pertinent test results  History of Anesthesia Complications Negative for: history of anesthetic complications  Airway Mallampati: II       Dental  (+) Partial Upper, Partial Lower   Pulmonary COPD, former smoker,           Cardiovascular hypertension, Pt. on medications and Pt. on home beta blockers      Neuro/Psych negative neurological ROS     GI/Hepatic negative GI ROS, Neg liver ROS,   Endo/Other  negative endocrine ROS  Renal/GU Renal InsufficiencyRenal disease     Musculoskeletal   Abdominal   Peds  Hematology negative hematology ROS (+)   Anesthesia Other Findings   Reproductive/Obstetrics                             Anesthesia Physical Anesthesia Plan  ASA: III  Anesthesia Plan: General   Post-op Pain Management:    Induction: Intravenous  Airway Management Planned: LMA  Additional Equipment:   Intra-op Plan:   Post-operative Plan:   Informed Consent: I have reviewed the patients History and Physical, chart, labs and discussed the procedure including the risks, benefits and alternatives for the proposed anesthesia with the patient or authorized representative who has indicated his/her understanding and acceptance.     Plan Discussed with:   Anesthesia Plan Comments:         Anesthesia Quick Evaluation

## 2016-02-01 NOTE — H&P (View-Only) (Signed)
Patient ID: Christina Sharp, female   DOB: Apr 19, 1945, 71 y.o.   MRN: [id number]  Chief Complaint  Patient presents with  . Other    Discuss Port Placement    HPI Christina Sharp is a 71 y.o. female.  She had a recent diagnosis made of adenocarcinoma. She underwent a right-sided thoracoscopy about 6 weeks ago and she has recovered nicely from that. She has an occasional discomfort along her incisions but otherwise is getting along very well. She did see Dr. Mike Gip who is recommended that she have a Port-A-Cath inserted for chemotherapy. She comes in today to discuss that.   Past Medical History:  Diagnosis Date  . Cancer ([place])    basal cell   . Emphysema of lung ([place])   . Hypercholesteremia   . Hypertension   . Renal disorder     Past Surgical History:  Procedure Laterality Date  . BASAL CELL CARCINOMA EXCISION    . COLONOSCOPY    . ENDOBRONCHIAL ULTRASOUND N/A 11/22/2015   Procedure: ENDOBRONCHIAL ULTRASOUND;  Surgeon: Flora Lipps, MD;  Location: [facility];  Service: Cardiopulmonary;  Laterality: N/A;  . FLEXIBLE BRONCHOSCOPY N/A 09/22/2015   Procedure: FLEXIBLE BRONCHOSCOPY;  Surgeon: [name], MD;  Location: [facility];  Service: Pulmonary;  Laterality: N/A;  . VIDEO ASSISTED THORACOSCOPY (VATS)/THOROCOTOMY Right 12/14/2015   Procedure: VIDEO ASSISTED THORACOSCOPY (VATS)/THOROCOTOMY;  Surgeon: Nestor Lewandowsky, MD;  Location: [facility];  Service: Thoracic;  Laterality: Right;    Family History  Problem Relation Age of Onset  . Alzheimer's disease Mother   . Heart disease Father   . Heart attack Brother     Social History Social History  Substance Use Topics  . Smoking status: Former Smoker    Packs/day: 0.50    Years: 45.00    Types: Cigarettes    Quit date: 11/06/2015  . Smokeless tobacco: Never Used     Comment: hasn't smoked since 11-06-15  . Alcohol use Yes     Comment: occasional    Allergies  Allergen Reactions  . Augmentin [Amoxicillin-Pot Clavulanate]  Hives  . Penicillins Hives    Has patient had a PCN reaction causing immediate rash, facial/tongue/throat swelling, SOB or lightheadedness with hypotension: No Has patient had a PCN reaction causing severe rash involving mucus membranes or skin necrosis: No Has patient had a PCN reaction that required hospitalization No Has patient had a PCN reaction occurring within the last 10 years: Yes If all of the above answers are "NO", then may proceed with Cephalosporin use.      Current Outpatient Prescriptions  Medication Sig Dispense Refill  . amLODipine (NORVASC) 10 MG tablet Take 10 mg by mouth daily.    Marland Kitchen atorvastatin (LIPITOR) 20 MG tablet Take 20 mg by mouth at bedtime.     . folic acid (FOLVITE) 1 MG tablet Take 1 tablet (1 mg total) by mouth daily. 30 tablet 3  . hydrALAZINE (APRESOLINE) 50 MG tablet Take 50 mg by mouth 3 (three) times daily.    . metoprolol succinate (TOPROL-XL) 100 MG 24 hr tablet Take 100 mg by mouth daily. Take with or immediately following a meal.    . sodium bicarbonate 650 MG tablet Take 1,300 mg by mouth 2 (two) times daily.     . traMADol (ULTRAM) 50 MG tablet Take 1 tablet (50 mg total) by mouth every 12 (twelve) hours as needed. 30 tablet 0  . aspirin 81 MG tablet Take 81 mg by mouth daily.  No current facility-administered medications for this visit.     Location, Quality, Duration, Severity, Timing, Context, Modifying Factors, Associated Signs and Symptoms.  Review of Systems A complete review of systems was asked and was negative except for the following positive findings Occasional discomfort along her incisions.  Blood pressure (!) 148/80, pulse 78, temperature 98.1 F (36.7 C), temperature source Oral, resp. rate (!) 22, height '5\' 4"'$  (1.626 m), weight 149 lb 12.8 oz (67.9 kg), SpO2 96 %.  Physical Exam CONSTITUTIONAL:  Pleasant, well-developed, well-nourished, and in no acute distress. EYES: Pupils equal and reactive to light, Sclera  non-icteric EARS, NOSE, MOUTH AND THROAT:  The oropharynx was clear.  Dentition is good repair.  Oral mucosa pink and moist. LYMPH NODES:  Lymph nodes in the neck and axillae were normal RESPIRATORY:  Lungs were clear.  Normal respiratory effort without pathologic use of accessory muscles of respiration CARDIOVASCULAR: Heart was regular without murmurs.  There were no carotid bruits. GI: The abdomen was soft, nontender, and nondistended. There were no palpable masses. There was no hepatosplenomegaly. There were normal bowel sounds in all quadrants. MUSCULOSKELETAL:  Normal muscle strength and tone.  No clubbing or cyanosis.   SKIN:  There were no pathologic skin lesions.  There were no nodules on palpation. NEUROLOGIC:  Sensation is normal.  Cranial nerves are grossly intact. PSYCH:  Oriented to person, place and time.  Mood and affect are normal.  Data Reviewed   I have personally reviewed the patient's imaging and medical records.    Assessment    Metastatic adenocarcinoma the lung. She requires a Port-A-Cath for chemotherapy.    Plan    We will plan on placing the Port-A-Cath in early January. I explained her the indications and risks. Risks of bleeding, infection and pneumothorax were all reviewed. She would like [place] to proceed.       Nestor Lewandowsky, MD 01/20/2016, 9:38 AM

## 2016-02-01 NOTE — Interval H&P Note (Signed)
History and Physical Interval Note:  02/01/2016 8:37 AM  Christina Sharp  has presented today for surgery, with the diagnosis of lung mass  The various methods of treatment have been discussed with the patient and family. After consideration of risks, benefits and other options for treatment, the patient has consented to  Procedure(s): INSERTION PORT-A-CATH (N/A) as a surgical intervention .  The patient's history has been reviewed, patient examined, no change in status, stable for surgery.  I have reviewed the patient's chart and labs.  Questions were answered to the patient's satisfaction.     Nestor Lewandowsky

## 2016-02-01 NOTE — Anesthesia Postprocedure Evaluation (Signed)
Anesthesia Post Note  Patient: Christina Sharp  Procedure(s) Performed: Procedure(s) (LRB): INSERTION PORT-A-CATH (Right)  Patient location during evaluation: PACU Anesthesia Type: General Level of consciousness: awake and alert Pain management: pain level controlled Vital Signs Assessment: post-procedure vital signs reviewed and stable Respiratory status: spontaneous breathing and respiratory function stable Cardiovascular status: stable Anesthetic complications: no     Last Vitals:  Vitals:   02/01/16 0816 02/01/16 1050  BP: (!) 142/77 132/78  Pulse: 82 65  Resp: 20 11  Temp: 36.4 C 36.5 C    Last Pain:  Vitals:   02/01/16 1050  TempSrc:   PainSc: Asleep                 Thurston Brendlinger K

## 2016-02-01 NOTE — Discharge Instructions (Signed)
AMBULATORY SURGERY  DISCHARGE INSTRUCTIONS   1) The drugs that you were given will stay in your system until tomorrow so for the next 24 hours you should not:  A) Drive an automobile B) Make any legal decisions C) Drink any alcoholic beverage   2) You may resume regular meals tomorrow.  Today it is better to start with liquids and gradually work up to solid foods.  You may eat anything you prefer, but it is better to start with liquids, then soup and crackers, and gradually work up to solid foods.   3) Please notify your doctor immediately if you have any unusual bleeding, trouble breathing, redness and pain at the surgery site, drainage, fever, or pain not relieved by medication.    4) Additional Instructions: Keep the incisions covered until you get to the cancer center on Friday.  They will remove the gauze bandage and will let you know if you can shower at that time. Continue to perform coughing and deep breathing exercises.   Please contact your physician with any problems or Same Day Surgery at [phone], Monday through Friday 6 am to 4 pm, or [place] at  [facility] number at [phone].

## 2016-02-02 ENCOUNTER — Other Ambulatory Visit: Payer: Self-pay | Admitting: *Deleted

## 2016-02-02 DIAGNOSIS — C3490 Malignant neoplasm of unspecified part of unspecified bronchus or lung: Secondary | ICD-10-CM

## 2016-02-03 ENCOUNTER — Inpatient Hospital Stay: Payer: BLUE CROSS/BLUE SHIELD

## 2016-02-03 ENCOUNTER — Other Ambulatory Visit: Payer: Self-pay

## 2016-02-03 ENCOUNTER — Other Ambulatory Visit: Payer: Self-pay | Admitting: *Deleted

## 2016-02-03 ENCOUNTER — Encounter: Payer: Self-pay | Admitting: Hematology and Oncology

## 2016-02-03 ENCOUNTER — Inpatient Hospital Stay: Payer: BLUE CROSS/BLUE SHIELD | Attending: Hematology and Oncology | Admitting: Hematology and Oncology

## 2016-02-03 ENCOUNTER — Other Ambulatory Visit: Payer: Self-pay | Admitting: Hematology and Oncology

## 2016-02-03 VITALS — BP 131/74 | HR 85 | Temp 97.6°F | Ht 63.0 in | Wt 149.1 lb

## 2016-02-03 DIAGNOSIS — J449 Chronic obstructive pulmonary disease, unspecified: Secondary | ICD-10-CM | POA: Diagnosis not present

## 2016-02-03 DIAGNOSIS — Z7982 Long term (current) use of aspirin: Secondary | ICD-10-CM | POA: Diagnosis not present

## 2016-02-03 DIAGNOSIS — E7800 Pure hypercholesterolemia, unspecified: Secondary | ICD-10-CM | POA: Insufficient documentation

## 2016-02-03 DIAGNOSIS — I10 Essential (primary) hypertension: Secondary | ICD-10-CM | POA: Insufficient documentation

## 2016-02-03 DIAGNOSIS — I739 Peripheral vascular disease, unspecified: Secondary | ICD-10-CM | POA: Insufficient documentation

## 2016-02-03 DIAGNOSIS — C3490 Malignant neoplasm of unspecified part of unspecified bronchus or lung: Secondary | ICD-10-CM

## 2016-02-03 DIAGNOSIS — Z5112 Encounter for antineoplastic immunotherapy: Secondary | ICD-10-CM | POA: Diagnosis not present

## 2016-02-03 DIAGNOSIS — Z79899 Other long term (current) drug therapy: Secondary | ICD-10-CM

## 2016-02-03 DIAGNOSIS — R599 Enlarged lymph nodes, unspecified: Secondary | ICD-10-CM

## 2016-02-03 DIAGNOSIS — C3431 Malignant neoplasm of lower lobe, right bronchus or lung: Secondary | ICD-10-CM | POA: Diagnosis not present

## 2016-02-03 DIAGNOSIS — F17210 Nicotine dependence, cigarettes, uncomplicated: Secondary | ICD-10-CM | POA: Diagnosis not present

## 2016-02-03 DIAGNOSIS — Z5111 Encounter for antineoplastic chemotherapy: Secondary | ICD-10-CM | POA: Insufficient documentation

## 2016-02-03 DIAGNOSIS — N2889 Other specified disorders of kidney and ureter: Secondary | ICD-10-CM | POA: Diagnosis not present

## 2016-02-03 DIAGNOSIS — C3491 Malignant neoplasm of unspecified part of right bronchus or lung: Secondary | ICD-10-CM

## 2016-02-03 LAB — COMPREHENSIVE METABOLIC PANEL
ALT: 14 U/L (ref 14–54)
AST: 23 U/L (ref 15–41)
Albumin: 3.6 g/dL (ref 3.5–5.0)
Alkaline Phosphatase: 93 U/L (ref 38–126)
Anion gap: 9 (ref 5–15)
BUN: 22 mg/dL — ABNORMAL HIGH (ref 6–20)
CO2: 21 mmol/L — ABNORMAL LOW (ref 22–32)
Calcium: 9.3 mg/dL (ref 8.9–10.3)
Chloride: 106 mmol/L (ref 101–111)
Creatinine, Ser: 1.08 mg/dL — ABNORMAL HIGH (ref 0.44–1.00)
GFR calc Af Amer: 58 mL/min — ABNORMAL LOW (ref >60)
GFR calc non Af Amer: 50 mL/min — ABNORMAL LOW (ref >60)
Glucose, Bld: 165 mg/dL — ABNORMAL HIGH (ref 65–99)
Potassium: 3.5 mmol/L (ref 3.5–5.1)
Sodium: 136 mmol/L (ref 135–145)
Total Bilirubin: 0.4 mg/dL (ref 0.3–1.2)
Total Protein: 7.4 g/dL (ref 6.5–8.1)

## 2016-02-03 LAB — CBC WITH DIFFERENTIAL/PLATELET
Basophils Absolute: 0.1 10*3/uL (ref 0–0.1)
Basophils Relative: 1 %
Eosinophils Absolute: 0 10*3/uL (ref 0–0.7)
Eosinophils Relative: 0 %
HCT: 37.2 % (ref 35.0–47.0)
Hemoglobin: 12.3 g/dL (ref 12.0–16.0)
Lymphocytes Relative: 5 %
Lymphs Abs: 0.9 10*3/uL — ABNORMAL LOW (ref 1.0–3.6)
MCH: 29.4 pg (ref 26.0–34.0)
MCHC: 33.1 g/dL (ref 32.0–36.0)
MCV: 88.8 fL (ref 80.0–100.0)
Monocytes Absolute: 0.5 10*3/uL (ref 0.2–0.9)
Monocytes Relative: 3 %
Neutro Abs: 16.9 10*3/uL — ABNORMAL HIGH (ref 1.4–6.5)
Neutrophils Relative %: 91 %
Platelets: 429 10*3/uL (ref 150–440)
RBC: 4.19 MIL/uL (ref 3.80–5.20)
RDW: 14.1 % (ref 11.5–14.5)
WBC: 18.4 10*3/uL — ABNORMAL HIGH (ref 3.6–11.0)

## 2016-02-03 LAB — TSH: TSH: 0.278 u[IU]/mL — ABNORMAL LOW (ref 0.350–4.500)

## 2016-02-03 LAB — T4, FREE: Free T4: 1 ng/dL (ref 0.61–1.12)

## 2016-02-03 LAB — MAGNESIUM: Magnesium: 1.9 mg/dL (ref 1.7–2.4)

## 2016-02-03 LAB — LACTATE DEHYDROGENASE: LDH: 128 U/L (ref 98–192)

## 2016-02-03 MED ORDER — LORAZEPAM 0.5 MG PO TABS: 0.5000 mg | ORAL_TABLET | Freq: Four times a day (QID) | ORAL | 0 refills | Status: DC | PRN

## 2016-02-03 MED ORDER — SODIUM CHLORIDE 0.9 % IV SOLN
900.0000 mg | Freq: Once | INTRAVENOUS | Status: AC
  Administered 2016-02-03: 900 mg via INTRAVENOUS
  Filled 2016-02-03: qty 20

## 2016-02-03 MED ORDER — LIDOCAINE-PRILOCAINE 2.5-2.5 % EX CREA: TOPICAL_CREAM | CUTANEOUS | 3 refills | Status: DC

## 2016-02-03 MED ORDER — SODIUM CHLORIDE 0.9 % IV SOLN
Freq: Once | INTRAVENOUS | Status: AC
  Administered 2016-02-03: 10:00:00 via INTRAVENOUS
  Filled 2016-02-03: qty 1000

## 2016-02-03 MED ORDER — SODIUM CHLORIDE 0.9 % IV SOLN
200.0000 mg | Freq: Once | INTRAVENOUS | Status: AC
  Administered 2016-02-03: 200 mg via INTRAVENOUS
  Filled 2016-02-03: qty 8

## 2016-02-03 MED ORDER — ONDANSETRON HCL 8 MG PO TABS: 8.0000 mg | ORAL_TABLET | Freq: Two times a day (BID) | ORAL | 1 refills | Status: DC

## 2016-02-03 MED ORDER — HEPARIN SOD (PORK) LOCK FLUSH 100 UNIT/ML IV SOLN
500.0000 [IU] | Freq: Once | INTRAVENOUS | Status: AC | PRN
  Administered 2016-02-03: 500 [IU]
  Filled 2016-02-03: qty 5

## 2016-02-03 MED ORDER — FOSAPREPITANT DIMEGLUMINE INJECTION 150 MG
Freq: Once | INTRAVENOUS | Status: AC
  Administered 2016-02-03: 10:00:00 via INTRAVENOUS
  Filled 2016-02-03: qty 5

## 2016-02-03 MED ORDER — SODIUM CHLORIDE 0.9 % IV SOLN
382.5000 mg | Freq: Once | INTRAVENOUS | Status: AC
  Administered 2016-02-03: 380 mg via INTRAVENOUS
  Filled 2016-02-03: qty 38

## 2016-02-03 MED ORDER — SODIUM CHLORIDE 0.9% FLUSH
10.0000 mL | INTRAVENOUS | Status: DC | PRN
  Administered 2016-02-03: 10 mL
  Filled 2016-02-03: qty 10

## 2016-02-03 MED ORDER — PALONOSETRON HCL INJECTION 0.25 MG/5ML
0.2500 mg | Freq: Once | INTRAVENOUS | Status: AC
  Administered 2016-02-03: 0.25 mg via INTRAVENOUS
  Filled 2016-02-03: qty 5

## 2016-02-03 NOTE — Progress Notes (Signed)
Patient here for pretreatment check. She has her port placed since last. Patient's BP is elevated today. She states that she monitors it at home and it is usually normal. She took her medications late today.
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
## 2016-02-04 DIAGNOSIS — Z5111 Encounter for antineoplastic chemotherapy: Secondary | ICD-10-CM | POA: Insufficient documentation

## 2016-02-04 LAB — CEA: CEA: 1.9 ng/mL (ref 0.0–4.7)

## 2016-02-06 ENCOUNTER — Other Ambulatory Visit: Payer: Self-pay | Admitting: Hematology and Oncology

## 2016-02-06 DIAGNOSIS — R7989 Other specified abnormal findings of blood chemistry: Secondary | ICD-10-CM

## 2016-02-06 LAB — ACTH: C206 ACTH: <1.1 pg/mL — ABNORMAL LOW (ref 7.2–63.3)

## 2016-02-06 NOTE — Telephone Encounter (Signed)
Called patient about cortisol level needing to be drawn, voiced understanding, will come in am at 0830 to have it drawn.

## 2016-02-07 ENCOUNTER — Inpatient Hospital Stay: Payer: BLUE CROSS/BLUE SHIELD

## 2016-02-07 DIAGNOSIS — R7989 Other specified abnormal findings of blood chemistry: Secondary | ICD-10-CM

## 2016-02-07 DIAGNOSIS — R599 Enlarged lymph nodes, unspecified: Secondary | ICD-10-CM | POA: Diagnosis not present

## 2016-02-07 DIAGNOSIS — Z5112 Encounter for antineoplastic immunotherapy: Secondary | ICD-10-CM | POA: Diagnosis not present

## 2016-02-07 DIAGNOSIS — Z5111 Encounter for antineoplastic chemotherapy: Secondary | ICD-10-CM | POA: Diagnosis not present

## 2016-02-07 DIAGNOSIS — J449 Chronic obstructive pulmonary disease, unspecified: Secondary | ICD-10-CM | POA: Diagnosis not present

## 2016-02-07 DIAGNOSIS — E7800 Pure hypercholesterolemia, unspecified: Secondary | ICD-10-CM | POA: Diagnosis not present

## 2016-02-07 DIAGNOSIS — C3431 Malignant neoplasm of lower lobe, right bronchus or lung: Secondary | ICD-10-CM | POA: Diagnosis not present

## 2016-02-07 LAB — CORTISOL: Cortisol, Plasma: 20.3 ug/dL

## 2016-02-09 ENCOUNTER — Ambulatory Visit
Admission: RE | Admit: 2016-02-09 | Discharge: 2016-02-09 | Disposition: A | Discharge status: Home / Self Care | Payer: BLUE CROSS/BLUE SHIELD | Source: Ambulatory Visit | Attending: Hematology and Oncology | Admitting: Hematology and Oncology

## 2016-02-09 ENCOUNTER — Ambulatory Visit: Payer: BLUE CROSS/BLUE SHIELD

## 2016-02-09 DIAGNOSIS — C3431 Malignant neoplasm of lower lobe, right bronchus or lung: Secondary | ICD-10-CM | POA: Diagnosis not present

## 2016-02-09 DIAGNOSIS — I700 Atherosclerosis of aorta: Secondary | ICD-10-CM | POA: Diagnosis not present

## 2016-02-09 DIAGNOSIS — R590 Localized enlarged lymph nodes: Secondary | ICD-10-CM | POA: Diagnosis not present

## 2016-02-10 ENCOUNTER — Encounter: Payer: Self-pay | Admitting: Cardiothoracic Surgery

## 2016-02-10 ENCOUNTER — Encounter: Payer: Self-pay | Admitting: Hematology and Oncology

## 2016-02-10 ENCOUNTER — Ambulatory Visit (INDEPENDENT_AMBULATORY_CARE_PROVIDER_SITE_OTHER): Payer: BLUE CROSS/BLUE SHIELD | Admitting: Cardiothoracic Surgery

## 2016-02-10 VITALS — BP 115/75 | HR 83 | Temp 97.7°F | Ht 63.0 in | Wt 145.0 lb

## 2016-02-10 DIAGNOSIS — C3490 Malignant neoplasm of unspecified part of unspecified bronchus or lung: Secondary | ICD-10-CM

## 2016-02-10 NOTE — Patient Instructions (Signed)
Please call our office with any questions or concerns.  Please do not submerge in a tub, hot tub, or pool until incisions are completely sealed.  Use sun block to incision area over the next year if this area will be exposed to sun. This helps decrease scarring.  If you develop redness, drainage, or pain at incision sites- call our office immediately and speak with a nurse.

## 2016-02-10 NOTE — Progress Notes (Signed)
She returns today for suture removal. She had her first dose of chemotherapy last week. She tolerated that well. She denied any fevers or chills.  Her wounds all look good. We did remove her sutures today. We placed new Steri-Strips over the wound. There is no drainage, erythema, swelling or discomfort.  Her sutures were removed. She was instructed to bathe over the wounds with soap and water as needed. She'll come back to see [place] when necessary.

## 2016-02-14 ENCOUNTER — Encounter: Payer: Self-pay | Admitting: Hematology and Oncology

## 2016-02-14 ENCOUNTER — Inpatient Hospital Stay (HOSPITAL_BASED_OUTPATIENT_CLINIC_OR_DEPARTMENT_OTHER): Payer: BLUE CROSS/BLUE SHIELD | Admitting: Hematology and Oncology

## 2016-02-14 ENCOUNTER — Inpatient Hospital Stay: Payer: BLUE CROSS/BLUE SHIELD

## 2016-02-14 ENCOUNTER — Other Ambulatory Visit: Payer: Self-pay | Admitting: Hematology and Oncology

## 2016-02-14 VITALS — BP 121/78 | HR 80 | Temp 96.7°F | Resp 18 | Wt 145.7 lb

## 2016-02-14 DIAGNOSIS — Z7982 Long term (current) use of aspirin: Secondary | ICD-10-CM

## 2016-02-14 DIAGNOSIS — Z79899 Other long term (current) drug therapy: Secondary | ICD-10-CM

## 2016-02-14 DIAGNOSIS — N2889 Other specified disorders of kidney and ureter: Secondary | ICD-10-CM

## 2016-02-14 DIAGNOSIS — J449 Chronic obstructive pulmonary disease, unspecified: Secondary | ICD-10-CM | POA: Diagnosis not present

## 2016-02-14 DIAGNOSIS — Z5112 Encounter for antineoplastic immunotherapy: Secondary | ICD-10-CM | POA: Diagnosis not present

## 2016-02-14 DIAGNOSIS — R7989 Other specified abnormal findings of blood chemistry: Secondary | ICD-10-CM

## 2016-02-14 DIAGNOSIS — F17210 Nicotine dependence, cigarettes, uncomplicated: Secondary | ICD-10-CM

## 2016-02-14 DIAGNOSIS — I739 Peripheral vascular disease, unspecified: Secondary | ICD-10-CM

## 2016-02-14 DIAGNOSIS — C3431 Malignant neoplasm of lower lobe, right bronchus or lung: Secondary | ICD-10-CM

## 2016-02-14 DIAGNOSIS — R599 Enlarged lymph nodes, unspecified: Secondary | ICD-10-CM | POA: Diagnosis not present

## 2016-02-14 DIAGNOSIS — E7800 Pure hypercholesterolemia, unspecified: Secondary | ICD-10-CM

## 2016-02-14 DIAGNOSIS — I10 Essential (primary) hypertension: Secondary | ICD-10-CM

## 2016-02-14 DIAGNOSIS — Z5111 Encounter for antineoplastic chemotherapy: Secondary | ICD-10-CM | POA: Diagnosis not present

## 2016-02-14 LAB — CBC WITH DIFFERENTIAL/PLATELET
Basophils Absolute: 0 10*3/uL (ref 0–0.1)
Basophils Relative: 1 %
Eosinophils Absolute: 0.2 10*3/uL (ref 0–0.7)
Eosinophils Relative: 6 %
HCT: 37.6 % (ref 35.0–47.0)
Hemoglobin: 12.6 g/dL (ref 12.0–16.0)
Lymphocytes Relative: 21 %
Lymphs Abs: 0.8 10*3/uL — ABNORMAL LOW (ref 1.0–3.6)
MCH: 29.4 pg (ref 26.0–34.0)
MCHC: 33.6 g/dL (ref 32.0–36.0)
MCV: 87.7 fL (ref 80.0–100.0)
Monocytes Absolute: 0.6 10*3/uL (ref 0.2–0.9)
Monocytes Relative: 14 %
Neutro Abs: 2.3 10*3/uL (ref 1.4–6.5)
Neutrophils Relative %: 58 %
Platelets: 194 10*3/uL (ref 150–440)
RBC: 4.29 MIL/uL (ref 3.80–5.20)
RDW: 13.6 % (ref 11.5–14.5)
WBC: 4 10*3/uL (ref 3.6–11.0)

## 2016-02-14 LAB — BASIC METABOLIC PANEL
Anion gap: 7 (ref 5–15)
BUN: 13 mg/dL (ref 6–20)
CO2: 24 mmol/L (ref 22–32)
Calcium: 9 mg/dL (ref 8.9–10.3)
Chloride: 104 mmol/L (ref 101–111)
Creatinine, Ser: 1.07 mg/dL — ABNORMAL HIGH (ref 0.44–1.00)
GFR calc Af Amer: 59 mL/min — ABNORMAL LOW (ref >60)
GFR calc non Af Amer: 51 mL/min — ABNORMAL LOW (ref >60)
Glucose, Bld: 109 mg/dL — ABNORMAL HIGH (ref 65–99)
Potassium: 3.7 mmol/L (ref 3.5–5.1)
Sodium: 135 mmol/L (ref 135–145)

## 2016-02-14 LAB — MAGNESIUM: Magnesium: 1.7 mg/dL (ref 1.7–2.4)

## 2016-02-14 LAB — CORTISOL: Cortisol, Plasma: 16.9 ug/dL

## 2016-02-14 MED ORDER — DEXAMETHASONE 4 MG PO TABS: ORAL_TABLET | ORAL | 1 refills | Status: DC

## 2016-02-14 NOTE — Progress Notes (Signed)
Patient offers no complaints today. 
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
## 2016-02-24 ENCOUNTER — Inpatient Hospital Stay: Payer: BLUE CROSS/BLUE SHIELD

## 2016-02-24 ENCOUNTER — Inpatient Hospital Stay (HOSPITAL_BASED_OUTPATIENT_CLINIC_OR_DEPARTMENT_OTHER): Payer: BLUE CROSS/BLUE SHIELD | Admitting: Oncology

## 2016-02-24 ENCOUNTER — Other Ambulatory Visit: Payer: Self-pay | Admitting: Hematology and Oncology

## 2016-02-24 VITALS — BP 122/83 | HR 80 | Temp 97.0°F | Ht 63.0 in | Wt 144.2 lb

## 2016-02-24 DIAGNOSIS — Z7982 Long term (current) use of aspirin: Secondary | ICD-10-CM

## 2016-02-24 DIAGNOSIS — J449 Chronic obstructive pulmonary disease, unspecified: Secondary | ICD-10-CM

## 2016-02-24 DIAGNOSIS — F17210 Nicotine dependence, cigarettes, uncomplicated: Secondary | ICD-10-CM

## 2016-02-24 DIAGNOSIS — R599 Enlarged lymph nodes, unspecified: Secondary | ICD-10-CM

## 2016-02-24 DIAGNOSIS — N2889 Other specified disorders of kidney and ureter: Secondary | ICD-10-CM

## 2016-02-24 DIAGNOSIS — C3431 Malignant neoplasm of lower lobe, right bronchus or lung: Secondary | ICD-10-CM

## 2016-02-24 DIAGNOSIS — I739 Peripheral vascular disease, unspecified: Secondary | ICD-10-CM

## 2016-02-24 DIAGNOSIS — Z79899 Other long term (current) drug therapy: Secondary | ICD-10-CM

## 2016-02-24 DIAGNOSIS — Z5112 Encounter for antineoplastic immunotherapy: Secondary | ICD-10-CM | POA: Diagnosis not present

## 2016-02-24 DIAGNOSIS — E7800 Pure hypercholesterolemia, unspecified: Secondary | ICD-10-CM

## 2016-02-24 DIAGNOSIS — Z5111 Encounter for antineoplastic chemotherapy: Secondary | ICD-10-CM

## 2016-02-24 DIAGNOSIS — R7989 Other specified abnormal findings of blood chemistry: Secondary | ICD-10-CM

## 2016-02-24 DIAGNOSIS — I10 Essential (primary) hypertension: Secondary | ICD-10-CM | POA: Diagnosis not present

## 2016-02-24 LAB — CBC WITH DIFFERENTIAL/PLATELET
Basophils Absolute: 0 10*3/uL (ref 0–0.1)
Basophils Relative: 0 %
Eosinophils Absolute: 0 10*3/uL (ref 0–0.7)
Eosinophils Relative: 0 %
HCT: 35.5 % (ref 35.0–47.0)
Hemoglobin: 12 g/dL (ref 12.0–16.0)
Lymphocytes Relative: 9 %
Lymphs Abs: 1.1 10*3/uL (ref 1.0–3.6)
MCH: 29.4 pg (ref 26.0–34.0)
MCHC: 33.7 g/dL (ref 32.0–36.0)
MCV: 87.4 fL (ref 80.0–100.0)
Monocytes Absolute: 1.3 10*3/uL — ABNORMAL HIGH (ref 0.2–0.9)
Monocytes Relative: 11 %
Neutro Abs: 9.4 10*3/uL — ABNORMAL HIGH (ref 1.4–6.5)
Neutrophils Relative %: 80 %
Platelets: 481 10*3/uL — ABNORMAL HIGH (ref 150–440)
RBC: 4.06 MIL/uL (ref 3.80–5.20)
RDW: 15 % — ABNORMAL HIGH (ref 11.5–14.5)
WBC: 11.7 10*3/uL — ABNORMAL HIGH (ref 3.6–11.0)

## 2016-02-24 LAB — COMPREHENSIVE METABOLIC PANEL
ALT: 17 U/L (ref 14–54)
AST: 30 U/L (ref 15–41)
Albumin: 3.5 g/dL (ref 3.5–5.0)
Alkaline Phosphatase: 84 U/L (ref 38–126)
Anion gap: 8 (ref 5–15)
BUN: 21 mg/dL — ABNORMAL HIGH (ref 6–20)
CO2: 26 mmol/L (ref 22–32)
Calcium: 9.2 mg/dL (ref 8.9–10.3)
Chloride: 102 mmol/L (ref 101–111)
Creatinine, Ser: 1.04 mg/dL — ABNORMAL HIGH (ref 0.44–1.00)
GFR calc Af Amer: >60 mL/min (ref >60)
GFR calc non Af Amer: 53 mL/min — ABNORMAL LOW (ref >60)
Glucose, Bld: 129 mg/dL — ABNORMAL HIGH (ref 65–99)
Potassium: 3.4 mmol/L — ABNORMAL LOW (ref 3.5–5.1)
Sodium: 136 mmol/L (ref 135–145)
Total Bilirubin: 0.5 mg/dL (ref 0.3–1.2)
Total Protein: 7.2 g/dL (ref 6.5–8.1)

## 2016-02-24 LAB — MAGNESIUM: Magnesium: 2 mg/dL (ref 1.7–2.4)

## 2016-02-24 LAB — TSH: TSH: 0.386 u[IU]/mL (ref 0.350–4.500)

## 2016-02-24 MED ORDER — HEPARIN SOD (PORK) LOCK FLUSH 100 UNIT/ML IV SOLN
500.0000 [IU] | Freq: Once | INTRAVENOUS | Status: AC
  Administered 2016-02-24: 500 [IU] via INTRAVENOUS

## 2016-02-24 MED ORDER — PALONOSETRON HCL INJECTION 0.25 MG/5ML
0.2500 mg | Freq: Once | INTRAVENOUS | Status: AC
  Administered 2016-02-24: 0.25 mg via INTRAVENOUS
  Filled 2016-02-24: qty 5

## 2016-02-24 MED ORDER — SODIUM CHLORIDE 0.9 % IV SOLN
Freq: Once | INTRAVENOUS | Status: AC
  Administered 2016-02-24: 12:00:00 via INTRAVENOUS
  Filled 2016-02-24: qty 5

## 2016-02-24 MED ORDER — SODIUM CHLORIDE 0.9% FLUSH
10.0000 mL | INTRAVENOUS | Status: DC | PRN
  Filled 2016-02-24: qty 10

## 2016-02-24 MED ORDER — SODIUM CHLORIDE 0.9 % IV SOLN
200.0000 mg | Freq: Once | INTRAVENOUS | Status: AC
  Administered 2016-02-24: 200 mg via INTRAVENOUS
  Filled 2016-02-24: qty 8

## 2016-02-24 MED ORDER — SODIUM CHLORIDE 0.9 % IV SOLN
900.0000 mg | Freq: Once | INTRAVENOUS | Status: AC
  Administered 2016-02-24: 900 mg via INTRAVENOUS
  Filled 2016-02-24: qty 20

## 2016-02-24 MED ORDER — SODIUM CHLORIDE 0.9 % IV SOLN
Freq: Once | INTRAVENOUS | Status: AC
  Administered 2016-02-24: 12:00:00 via INTRAVENOUS
  Filled 2016-02-24: qty 1000

## 2016-02-24 MED ORDER — HEPARIN SOD (PORK) LOCK FLUSH 100 UNIT/ML IV SOLN
500.0000 [IU] | Freq: Once | INTRAVENOUS | Status: AC | PRN
  Filled 2016-02-24: qty 5

## 2016-02-24 MED ORDER — SODIUM CHLORIDE 0.9 % IV SOLN
380.0000 mg | Freq: Once | INTRAVENOUS | Status: AC
  Administered 2016-02-24: 380 mg via INTRAVENOUS
  Filled 2016-02-24: qty 38

## 2016-02-24 NOTE — Progress Notes (Signed)
Pre treatment check no complaints today. She has a question regarding her prednisone dosage.

## 2016-02-24 NOTE — Progress Notes (Signed)
Hematology/Oncology Consult note [facility]  [phone] [phone]  Patient Care Team: Anthonette Legato, MD as PCP - General (Internal Medicine) Nestor Lewandowsky, MD as Referring Physician (Cardiothoracic Surgery) Flora Lipps, MD as Consulting Physician (Pulmonary Disease) [name], MD as Consulting Physician (Pulmonary Disease)   Name of the patient: Christina Sharp  [id number]  February 25, 1945   Date of visit: 02/24/16  Diagnosis- stage IV adenocarcinoma of lung  Chief complaint/ Reason for visit- on treatment assessment prior to chemotherapy  Heme/Onc history: Christina Sharp is a 71 y.o. 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  Bronchoscopy on 09/22/2015 revealed mild to moderate chronic bronchitis changes with no endobronchial lesions.  Brushings from the right lower lobe were negative for malignancy.  Repeat bronchoscopy on 11/22/2015 was performed.  An EBUS scope was unable to be passed in the RML.  Transbronchial needle aspirations of a lesion were performed in the medial segment of the right middle lobe.  Pathology revealed atypical cells.  CEA and LDH were normal on 11/23/2015.  She underwent video assisted thoracoscopy (VATS) on 12/14/2015.  On the undersurface of the right middle lobe there wasa palpable mass.  Pathology from the right middle lobe wedge resection revealed adenocarcinoma, lepidic (80%), papillary (10%), and solid (10%).  Pleural biopsy was negative for malignancy.  EGFR, ALK, ROS1, BRAF were negative.  PDL-1 was 5%.  PET scan on 11/21/2015 revealed a 2.9 x 3 cm central right lower lobe lung lesion (SUV 7.0).  Areas of surrounding more patchy right lower lobe opacity were also suspicious for metastasis or synchronous primaries. There were bilateral pulmonary nodules, including hypermetabolic right upper and right lower lobe pulmonary nodules suspicious for metastatic disease.  There was no thoracic  nodal hypermetabolism.  There was an area of soft tissue fullness within the high left neck suspicious for an enlarged lateral retropharyngeal node (? skip nodal metastasis).  There was indeterminate right renal lesion.  Clinical stage is T3N0M1.  Head MRI on 12/31/2015 revealed atrophy and small vessel disease.  There were no acute intracranial findings or intracranial metastatic disease.  There was suspected metastatic disease to a 1.2 x 2.3 cmleft lateral retropharyngeal node which displaced the left ICA medially.  Neck, chest, abdomen, and pelvic CT scan on 02/09/2016 revealed a 1.3 x 2.6 cm lateral retropharyngeal lymph node (stable), increase in size of nodules in the lingula and LLL and persistent nodular consolidation in the RLL.  Mediastinal adenopathy was stable.  Pretreatment labs included a TSH 0.278 (low), free T4 1.0 (normal), and ACTH < 1.1 (low).  Follow-up early am cortisol on 02/07/2016 was 20.3 (6.7-22.6).    Interval history- Patient tolerated cycle 1 of chemotherapy well without any significant side effects. She had 1 day of self limited nausea and fatigue for a couple of days post chemo. Denies other complaints  ECOG PS- 0 Pain scale- 0 Opioid associated constipation- no  Review of systems- Review of Systems  Constitutional: Negative for chills, fever, malaise/fatigue and weight loss.  HENT: Negative for congestion, ear discharge and nosebleeds.   Eyes: Negative for blurred vision.  Respiratory: Negative for cough, hemoptysis, sputum production, shortness of breath and wheezing.   Cardiovascular: Negative for chest pain, palpitations, orthopnea and claudication.  Gastrointestinal: Negative for abdominal pain, blood in stool, constipation, diarrhea, heartburn, melena, nausea and vomiting.  Genitourinary: Negative for dysuria, flank pain, frequency, hematuria and urgency.  Musculoskeletal: Negative for back pain, joint pain and myalgias.  Skin: Negative for rash.    Neurological: Negative for dizziness, tingling, focal weakness, seizures, weakness and headaches.  Endo/Heme/Allergies: Does not bruise/bleed easily.  Psychiatric/Behavioral: Negative for depression and suicidal ideas. The patient does not have insomnia.      Current treatment- carbo/alimta/keytruda  Allergies  Allergen Reactions  . Augmentin [Amoxicillin-Pot Clavulanate] Hives  . Penicillins Hives    Has patient had a PCN reaction causing immediate rash, facial/tongue/throat swelling, SOB or lightheadedness with hypotension: No Has patient had a PCN reaction causing severe rash involving mucus membranes or skin necrosis: No Has patient had a PCN reaction that required hospitalization No Has patient had a PCN reaction occurring within the last 10 years: Yes If all of the above answers are "NO", then may proceed with Cephalosporin use.       Past Medical History:  Diagnosis Date  . Cancer ([place])    basal cell   . Emphysema of lung ([place])   . Hypercholesteremia   . Hypertension   . Lung mass   . Renal disorder    renal insufficiency     Past Surgical History:  Procedure Laterality Date  . BASAL CELL CARCINOMA EXCISION    . COLONOSCOPY    . ENDOBRONCHIAL ULTRASOUND N/A 11/22/2015   Procedure: ENDOBRONCHIAL ULTRASOUND;  Surgeon: Flora Lipps, MD;  Location: [facility] ORS;  Service: Cardiopulmonary;  Laterality: N/A;  . FLEXIBLE BRONCHOSCOPY N/A 09/22/2015   Procedure: FLEXIBLE BRONCHOSCOPY;  Surgeon: [name], MD;  Location: [facility] ORS;  Service: Pulmonary;  Laterality: N/A;  . PORTACATH PLACEMENT Right 02/01/2016   Procedure: INSERTION PORT-A-CATH;  Surgeon: Nestor Lewandowsky, MD;  Location: [facility] ORS;  Service: General;  Laterality: Right;  Right internal jugular vein  . VIDEO ASSISTED THORACOSCOPY (VATS)/THOROCOTOMY Right 12/14/2015   Procedure: VIDEO ASSISTED THORACOSCOPY (VATS)/THOROCOTOMY;  Surgeon: Nestor Lewandowsky, MD;  Location: [facility] ORS;  Service: Thoracic;  Laterality: Right;     Social History   Social History  . Marital status: Single    Spouse name: N/A  . Number of children: N/A  . Years of education: N/A   Occupational History  . Not on file.   Social History Main Topics  . Smoking status: Former Smoker    Packs/day: 0.50    Years: 45.00    Types: Cigarettes    Quit date: 11/06/2015  . Smokeless tobacco: Never Used     Comment: hasn't smoked since 11-06-15  . Alcohol use Yes     Comment: rare  . Drug use: No  . Sexual activity: Not on file   Other Topics Concern  . Not on file   Social History Narrative  . No narrative on file    Family History  Problem Relation Age of Onset  . Alzheimer's disease Mother   . Heart disease Father   . Heart attack Brother      Current Outpatient Prescriptions:  .  amLODipine (NORVASC) 10 MG tablet, Take 10 mg by mouth daily., Disp: , Rfl:  .  aspirin 81 MG tablet, Take 81 mg by mouth daily. HAS STOPPED FOR PROCEDURE, Disp: , Rfl:  .  atorvastatin (LIPITOR) 20 MG tablet, Take 20 mg by mouth at bedtime. , Disp: , Rfl:  .  dexamethasone (DECADRON) 4 MG tablet, Take 1 tab two times a day the day before Alimta chemo. Take 2 tabs two times a day starting the day after chemo for 3 days., Disp: 30 tablet, Rfl: 1 .  folic acid (FOLVITE) 1 MG tablet, Take 1 tablet (  1 mg total) by mouth daily., Disp: 30 tablet, Rfl: 3 .  hydrALAZINE (APRESOLINE) 50 MG tablet, Take 50 mg by mouth 3 (three) times daily., Disp: , Rfl:  .  lidocaine-prilocaine (EMLA) cream, Apply to affected area once, Disp: 30 g, Rfl: 3 .  LORazepam (ATIVAN) 0.5 MG tablet, Take 1 tablet (0.5 mg total) by mouth every 6 (six) hours as needed (Nausea or vomiting)., Disp: 30 tablet, Rfl: 0 .  metoprolol succinate (TOPROL-XL) 100 MG 24 hr tablet, Take 100 mg by mouth daily. Take with or immediately following a meal., Disp: , Rfl:  .  ondansetron (ZOFRAN) 8 MG tablet, Take 1 tablet (8 mg total) by mouth 2 (two) times daily. Take two times a day starting  the day after chemo for 3 days. Then take two times a day as needed for nausea or vomiting., Disp: 30 tablet, Rfl: 1 .  sodium bicarbonate 650 MG tablet, Take 1,300 mg by mouth 2 (two) times daily. , Disp: , Rfl:  .  traMADol (ULTRAM) 50 MG tablet, Take 1 tablet (50 mg total) by mouth every 12 (twelve) hours as needed., Disp: 30 tablet, Rfl: 0 No current facility-administered medications for this visit.   Facility-Administered Medications Ordered in Other Visits:  .  0.9 %  sodium chloride infusion, , Intravenous, Once, Lequita Asal, MD .  CARBOplatin (PARAPLATIN) 380 mg in sodium chloride 0.9 % 250 mL chemo infusion, 380 mg, Intravenous, Once, Sindy Guadeloupe, MD .  fosaprepitant (EMEND) 150 mg, dexamethasone (DECADRON) 12 mg in sodium chloride 0.9 % 145 mL IVPB, , Intravenous, Once, Lequita Asal, MD .  heparin lock flush 100 unit/mL, 500 Units, Intravenous, Once, Lequita Asal, MD .  heparin lock flush 100 unit/mL, 500 Units, Intracatheter, Once PRN, Lequita Asal, MD .  palonosetron (ALOXI) injection 0.25 mg, 0.25 mg, Intravenous, Once, Lequita Asal, MD .  pembrolizumab (KEYTRUDA) 200 mg in sodium chloride 0.9 % 50 mL chemo infusion, 200 mg, Intravenous, Once, Lequita Asal, MD .  PEMEtrexed (ALIMTA) 900 mg in sodium chloride 0.9 % 100 mL chemo infusion, 900 mg, Intravenous, Once, Lequita Asal, MD .  sodium chloride flush (NS) 0.9 % injection 10 mL, 10 mL, Intravenous, PRN, Lequita Asal, MD  Physical exam:  Vitals:   02/24/16 1043  BP: 122/83  Pulse: 80  Temp: 97 F (36.1 C)  TempSrc: Tympanic  SpO2: 97%  Weight: 144 lb 2.9 oz (65.4 kg)  Height: 5' 3"  (1.6 m)   Physical Exam  Constitutional: She is oriented to person, place, and time and well-developed, well-nourished, and in no distress.  HENT:  Head: Normocephalic and atraumatic.  Eyes: EOM are normal. Pupils are equal, round, and reactive to light.  Neck: Normal range of motion.   Cardiovascular: Normal rate, regular rhythm and normal heart sounds.   Pulmonary/Chest: Effort normal and breath sounds normal.  Abdominal: Soft. Bowel sounds are normal.  Neurological: She is alert and oriented to person, place, and time.  Skin: Skin is warm and dry.     CMP Latest Ref Rng & Units 02/24/2016  Glucose 65 - 99 mg/dL 129(H)  BUN 6 - 20 mg/dL 21(H)  Creatinine 0.44 - 1.00 mg/dL 1.04(H)  Sodium 135 - 145 mmol/L 136  Potassium 3.5 - 5.1 mmol/L 3.4(L)  Chloride 101 - 111 mmol/L 102  CO2 22 - 32 mmol/L 26  Calcium 8.9 - 10.3 mg/dL 9.2  Total Protein 6.5 - 8.1 g/dL 7.2  Total  Bilirubin 0.3 - 1.2 mg/dL 0.5  Alkaline Phos 38 - 126 U/L 84  AST 15 - 41 U/L 30  ALT 14 - 54 U/L 17   CBC Latest Ref Rng & Units 02/24/2016  WBC 3.6 - 11.0 K/uL 11.7(H)  Hemoglobin 12.0 - 16.0 g/dL 12.0  Hematocrit 35.0 - 47.0 % 35.5  Platelets 150 - 440 K/uL 481(H)    No images are attached to the encounter.  Ct Abdomen Pelvis Wo Contrast  Result Date: 02/09/2016 CLINICAL DATA:  Stage IV adenocarcinoma RIGHT lower lobe EXAM: CT CHEST, ABDOMEN AND PELVIS WITHOUT CONTRAST TECHNIQUE: Multidetector CT imaging of the chest, abdomen and pelvis was performed following the standard protocol without IV contrast. COMPARISON:  None. FINDINGS: CT CHEST FINDINGS Cardiovascular: Coronary artery calcification and aortic atherosclerotic calcification. Mediastinum/Nodes: No axillary or supraclavicular adenopathy. RIGHT paratracheal lymph node measures 9 mm. Subcarinal lymph node measures 13 mm. Lungs/Pleura: Persisting consolidation in the RIGHT lower lobe. No pleural effusion. Nodular lesions difficult to measure on the background of consolidation. Several peripheral small nodules are present not changed from prior including 10 mm nodule on image 86, series 5 in the RIGHT lower lobe. There is new irregular nodules in the LEFT lower lobe. For example 17 mm nodule (image 87 series 5). Lingular nodule measuring 13 mm on  image 82. Nodule anterior to the fissure measuring 12 mm increased from 6 mm. Musculoskeletal: No acute osseous abnormality. CT ABDOMEN PELVIS FINDINGS Hepatobiliary: No focal hepatic lesions noncontrast exam. Pancreas: Pancreas is normal. No ductal dilatation. No pancreatic inflammation. Spleen: Normal spleen Adrenals/Urinary Tract: Adrenal glands, kidneys and bladder appear normal Stomach/Bowel: Stomach, small-bowel appendix cecum normal. The colon and rectosigmoid colon normal. Vascular/Lymphatic: Abdominal aorta is normal caliber with atherosclerotic calcification. There is no retroperitoneal or periportal lymphadenopathy. No pelvic lymphadenopathy. Reproductive: Uterus normal ovaries normal Other: No peritoneal nodularity Musculoskeletal: No aggressive osseous lesion IMPRESSION: Chest Impression: 1. Increase in size nodules in the lingula and LEFT lower lobe concerning for metastatic disease. 2. Persistent nodular consolidation in the RIGHT lower lobe. Suspicion for residual carcinoma. 3. Consider restaging FDG PET scan. 4. Stable mediastinal lymphadenopathy. Abdomen / Pelvis Impression: 1. No evidence of metastatic disease in the abdomen pelvis. 2. See Atherosclerotic calcification of the aorta. Electronically Signed   By: Suzy Bouchard M.D.   On: 02/09/2016 17:00   Dg Chest 2 View  Result Date: 01/31/2016 CLINICAL DATA:  71 y.o. female. She had a recent diagnosis made of adenocarcinoma. She underwent a right-sided thoracoscopy about 6 weeks ago and she has recovered nicely from that. She has an occasional discomfort along her incisions but otherwise is getting along very well. She did see Dr. Mike Gip who is recommended that she have a Port-A-Cath inserted for chemotherapy. EXAM: CHEST  2 VIEW COMPARISON:  12/30/2015 and prior studies including prior chest CTs. FINDINGS: Opacity at the right lung base obscures right hemidiaphragm consistent with a small to moderate effusion with associated atelectasis  or postobstructive change. This mostly obscures previously described right lung base nodules. There is a nodule at the left lung base curling measuring approximately 13 mm, which appears mildly increased from the prior study. No other discrete lung nodules. No left pleural effusion.  No pneumothorax.  No pulmonary edema. Cardiac silhouette is normal in size. No mediastinal or hilar masses or convincing adenopathy. Right infrahilar mass noted previously is again noted, without significant change from the recent study. Skeletal structures are intact. IMPRESSION: 1. Right lung base opacity consistent pleural fluid, atelectasis  and the previous described masses, without change from the most recent prior exam. 2. Lower left upper lobe pulmonary nodule has mildly increased in size from the prior exam. 3. No other change.  No acute abnormality. Electronically Signed   By: Lajean Manes M.D.   On: 01/31/2016 13:29   Ct Soft Tissue Neck Wo Contrast  Result Date: 02/09/2016 CLINICAL DATA:  71 y/o F; right lower lobe adenocarcinoma of the lung, stage IV for restaging. EXAM: CT NECK WITHOUT CONTRAST TECHNIQUE: Multidetector CT imaging of the neck was performed following the standard protocol without intravenous contrast. COMPARISON:  11/21/2015 PET-CT.  Concurrent CT of chest. FINDINGS: Pharynx and larynx: Normal. No mass or swelling. Salivary glands: No inflammation, mass, or stone. Thyroid: Normal. Lymph nodes: Nodule localized to the lateral retropharyngeal region on prior PET-CT measures 13 x 26 mm axially (series 2, image 30), stable in comparison with prior PET-CT when remeasured in a similar fashion. No new lymphadenopathy. Vascular: Mild calcific atherosclerosis of left-greater-than-right carotid bifurcations. Limited intracranial: Negative. Visualized orbits: Negative. Mastoids and visualized paranasal sinuses: Clear. Skeleton: Cortical thickening of the left lateral mandible at site of multiple dental extractions  probably represents sequelae of condensing osteitis from dental disease. Right maxillary canine and right mandibular second premolar dental caries. Periapical cystic disease of teeth. Cervical spondylosis with mild discogenic and moderate facet arthrosis. No high-grade bony canal stenosis. Upper chest: Please refer to concurrent CT of chest for further evaluation of lungs and mediastinum. Other: None. IMPRESSION: Nodule localized to the lateral retropharyngeal region on prior PET-CT, probably representing metastatic adenopathy, measures 13 x 26 mm axially, stable in comparison with prior PET-CT when remeasured in a similar fashion. No new lymphadenopathy. Electronically Signed   By: Kristine Garbe M.D.   On: 02/09/2016 18:56   Ct Chest Wo Contrast  Result Date: 02/09/2016 CLINICAL DATA:  Stage IV adenocarcinoma RIGHT lower lobe EXAM: CT CHEST, ABDOMEN AND PELVIS WITHOUT CONTRAST TECHNIQUE: Multidetector CT imaging of the chest, abdomen and pelvis was performed following the standard protocol without IV contrast. COMPARISON:  None. FINDINGS: CT CHEST FINDINGS Cardiovascular: Coronary artery calcification and aortic atherosclerotic calcification. Mediastinum/Nodes: No axillary or supraclavicular adenopathy. RIGHT paratracheal lymph node measures 9 mm. Subcarinal lymph node measures 13 mm. Lungs/Pleura: Persisting consolidation in the RIGHT lower lobe. No pleural effusion. Nodular lesions difficult to measure on the background of consolidation. Several peripheral small nodules are present not changed from prior including 10 mm nodule on image 86, series 5 in the RIGHT lower lobe. There is new irregular nodules in the LEFT lower lobe. For example 17 mm nodule (image 87 series 5). Lingular nodule measuring 13 mm on image 82. Nodule anterior to the fissure measuring 12 mm increased from 6 mm. Musculoskeletal: No acute osseous abnormality. CT ABDOMEN PELVIS FINDINGS Hepatobiliary: No focal hepatic lesions  noncontrast exam. Pancreas: Pancreas is normal. No ductal dilatation. No pancreatic inflammation. Spleen: Normal spleen Adrenals/Urinary Tract: Adrenal glands, kidneys and bladder appear normal Stomach/Bowel: Stomach, small-bowel appendix cecum normal. The colon and rectosigmoid colon normal. Vascular/Lymphatic: Abdominal aorta is normal caliber with atherosclerotic calcification. There is no retroperitoneal or periportal lymphadenopathy. No pelvic lymphadenopathy. Reproductive: Uterus normal ovaries normal Other: No peritoneal nodularity Musculoskeletal: No aggressive osseous lesion IMPRESSION: Chest Impression: 1. Increase in size nodules in the lingula and LEFT lower lobe concerning for metastatic disease. 2. Persistent nodular consolidation in the RIGHT lower lobe. Suspicion for residual carcinoma. 3. Consider restaging FDG PET scan. 4. Stable mediastinal lymphadenopathy. Abdomen / Pelvis Impression:  1. No evidence of metastatic disease in the abdomen pelvis. 2. See Atherosclerotic calcification of the aorta. Electronically Signed   By: Suzy Bouchard M.D.   On: 02/09/2016 17:00   Dg Chest Port 1 View  Result Date: 02/01/2016 CLINICAL DATA:  71 y/o female status post porta cath placement. Status post right thoracoscopy following diagnosis of adenocarcinoma. Initial encounter. EXAM: PORTABLE CHEST 1 VIEW COMPARISON:  Fluoroscopic intraoperative images from 10/30 2 hours today, earlier chest radiographs 12/30/2016 and earlier. FINDINGS: Portable AP upright view at 1113 hours. Right IJ approach porta cath in place. The port has the "CT" designation stamped on it indicating a power port. Catheter tip projects at the level of the SVC, just below the carina. No pneumothorax. Stable ventilation with veiling and confluent right lung base opacity and several left lung base nodules. Mediastinal contours are stable. Calcified aortic atherosclerosis. Visualized tracheal air column is within normal limits. IMPRESSION:  1. Right chest power port placed with no adverse features. Catheter tip at the SVC level. 2. Otherwise stable chest. Electronically Signed   By: Genevie Ann M.D.   On: 02/01/2016 11:37   Dg C-arm 1-60 Min-no Report  Result Date: 02/01/2016 There is no Radiologist interpretation  for this exam.    Assessment and plan- Patient is a 71 y.o. female with stage IV adenocarcinoma of the lung s/p 1 cycle of carbo/alimta/keytruda  1. Counts are ok to proceed with cycle # 2 of treatment today. She will see Dr. Mike Gip back in 3 weeks with cbc, cmp, mg. TSH WNL. She knows to call if there are any questions or concerns in the interim     Visit Diagnosis 1. Cancer of lower lobe of right lung ([place])   2. Encounter for antineoplastic chemotherapy      Dr. Randa Evens, MD, MPH [facility] Pager- [phone] 02/24/2016 11:54 AM

## 2016-03-08 DIAGNOSIS — N183 Chronic kidney disease, stage 3 (moderate): Secondary | ICD-10-CM | POA: Diagnosis not present

## 2016-03-08 DIAGNOSIS — I10 Essential (primary) hypertension: Secondary | ICD-10-CM | POA: Diagnosis not present

## 2016-03-08 DIAGNOSIS — E872 Acidosis: Secondary | ICD-10-CM | POA: Diagnosis not present

## 2016-03-08 DIAGNOSIS — R809 Proteinuria, unspecified: Secondary | ICD-10-CM | POA: Diagnosis not present

## 2016-03-14 ENCOUNTER — Other Ambulatory Visit: Payer: Self-pay | Admitting: *Deleted

## 2016-03-14 DIAGNOSIS — Z85118 Personal history of other malignant neoplasm of bronchus and lung: Secondary | ICD-10-CM

## 2016-03-16 ENCOUNTER — Inpatient Hospital Stay: Payer: BLUE CROSS/BLUE SHIELD | Attending: Hematology and Oncology

## 2016-03-16 ENCOUNTER — Other Ambulatory Visit: Payer: Self-pay | Admitting: Hematology and Oncology

## 2016-03-16 ENCOUNTER — Inpatient Hospital Stay: Payer: BLUE CROSS/BLUE SHIELD

## 2016-03-16 ENCOUNTER — Inpatient Hospital Stay (HOSPITAL_BASED_OUTPATIENT_CLINIC_OR_DEPARTMENT_OTHER): Payer: BLUE CROSS/BLUE SHIELD | Admitting: Hematology and Oncology

## 2016-03-16 VITALS — BP 109/71 | HR 85 | Temp 93.8°F | Resp 18 | Wt 138.9 lb

## 2016-03-16 DIAGNOSIS — Z5111 Encounter for antineoplastic chemotherapy: Secondary | ICD-10-CM

## 2016-03-16 DIAGNOSIS — I10 Essential (primary) hypertension: Secondary | ICD-10-CM | POA: Diagnosis not present

## 2016-03-16 DIAGNOSIS — Z85828 Personal history of other malignant neoplasm of skin: Secondary | ICD-10-CM | POA: Diagnosis not present

## 2016-03-16 DIAGNOSIS — E7800 Pure hypercholesterolemia, unspecified: Secondary | ICD-10-CM

## 2016-03-16 DIAGNOSIS — T451X5S Adverse effect of antineoplastic and immunosuppressive drugs, sequela: Secondary | ICD-10-CM | POA: Insufficient documentation

## 2016-03-16 DIAGNOSIS — J439 Emphysema, unspecified: Secondary | ICD-10-CM

## 2016-03-16 DIAGNOSIS — Z7982 Long term (current) use of aspirin: Secondary | ICD-10-CM

## 2016-03-16 DIAGNOSIS — E876 Hypokalemia: Secondary | ICD-10-CM | POA: Insufficient documentation

## 2016-03-16 DIAGNOSIS — N2889 Other specified disorders of kidney and ureter: Secondary | ICD-10-CM | POA: Insufficient documentation

## 2016-03-16 DIAGNOSIS — R634 Abnormal weight loss: Secondary | ICD-10-CM | POA: Diagnosis not present

## 2016-03-16 DIAGNOSIS — Z85118 Personal history of other malignant neoplasm of bronchus and lung: Secondary | ICD-10-CM

## 2016-03-16 DIAGNOSIS — Z87891 Personal history of nicotine dependence: Secondary | ICD-10-CM

## 2016-03-16 DIAGNOSIS — Z79899 Other long term (current) drug therapy: Secondary | ICD-10-CM

## 2016-03-16 DIAGNOSIS — Z5112 Encounter for antineoplastic immunotherapy: Secondary | ICD-10-CM | POA: Insufficient documentation

## 2016-03-16 DIAGNOSIS — C3431 Malignant neoplasm of lower lobe, right bronchus or lung: Secondary | ICD-10-CM | POA: Insufficient documentation

## 2016-03-16 LAB — CBC WITH DIFFERENTIAL/PLATELET
Basophils Absolute: 0.1 10*3/uL (ref 0–0.1)
Basophils Relative: 1 %
Eosinophils Absolute: 0 10*3/uL (ref 0–0.7)
Eosinophils Relative: 0 %
HCT: 31.8 % — ABNORMAL LOW (ref 35.0–47.0)
Hemoglobin: 10.5 g/dL — ABNORMAL LOW (ref 12.0–16.0)
Lymphocytes Relative: 8 %
Lymphs Abs: 1.5 10*3/uL (ref 1.0–3.6)
MCH: 28.7 pg (ref 26.0–34.0)
MCHC: 33 g/dL (ref 32.0–36.0)
MCV: 87 fL (ref 80.0–100.0)
Monocytes Absolute: 0.7 10*3/uL (ref 0.2–0.9)
Monocytes Relative: 4 %
Neutro Abs: 16 10*3/uL — ABNORMAL HIGH (ref 1.4–6.5)
Neutrophils Relative %: 87 %
Platelets: 609 10*3/uL — ABNORMAL HIGH (ref 150–440)
RBC: 3.65 MIL/uL — ABNORMAL LOW (ref 3.80–5.20)
RDW: 16.8 % — ABNORMAL HIGH (ref 11.5–14.5)
WBC: 18.4 10*3/uL — ABNORMAL HIGH (ref 3.6–11.0)

## 2016-03-16 LAB — COMPREHENSIVE METABOLIC PANEL
ALT: 18 U/L (ref 14–54)
AST: 33 U/L (ref 15–41)
Albumin: 3 g/dL — ABNORMAL LOW (ref 3.5–5.0)
Alkaline Phosphatase: 85 U/L (ref 38–126)
Anion gap: 11 (ref 5–15)
BUN: 16 mg/dL (ref 6–20)
CO2: 25 mmol/L (ref 22–32)
Calcium: 8.6 mg/dL — ABNORMAL LOW (ref 8.9–10.3)
Chloride: 100 mmol/L — ABNORMAL LOW (ref 101–111)
Creatinine, Ser: 1.11 mg/dL — ABNORMAL HIGH (ref 0.44–1.00)
GFR calc Af Amer: 57 mL/min — ABNORMAL LOW (ref >60)
GFR calc non Af Amer: 49 mL/min — ABNORMAL LOW (ref >60)
Glucose, Bld: 155 mg/dL — ABNORMAL HIGH (ref 65–99)
Potassium: 2.9 mmol/L — ABNORMAL LOW (ref 3.5–5.1)
Sodium: 136 mmol/L (ref 135–145)
Total Bilirubin: 0.4 mg/dL (ref 0.3–1.2)
Total Protein: 7 g/dL (ref 6.5–8.1)

## 2016-03-16 LAB — MAGNESIUM: Magnesium: 1.4 mg/dL — ABNORMAL LOW (ref 1.7–2.4)

## 2016-03-16 LAB — TSH: TSH: 0.206 u[IU]/mL — ABNORMAL LOW (ref 0.350–4.500)

## 2016-03-16 MED ORDER — SODIUM CHLORIDE 0.9 % IV SOLN
Freq: Once | INTRAVENOUS | Status: AC
  Administered 2016-03-16: 13:00:00 via INTRAVENOUS
  Filled 2016-03-16: qty 5

## 2016-03-16 MED ORDER — SODIUM CHLORIDE 0.9 % IV SOLN
Freq: Once | INTRAVENOUS | Status: AC
  Administered 2016-03-16: 11:00:00 via INTRAVENOUS
  Filled 2016-03-16: qty 15

## 2016-03-16 MED ORDER — SODIUM CHLORIDE 0.9% FLUSH
10.0000 mL | INTRAVENOUS | Status: DC | PRN
  Administered 2016-03-16: 10 mL via INTRAVENOUS
  Filled 2016-03-16: qty 10

## 2016-03-16 MED ORDER — PALONOSETRON HCL INJECTION 0.25 MG/5ML
0.2500 mg | Freq: Once | INTRAVENOUS | Status: AC
  Administered 2016-03-16: 0.25 mg via INTRAVENOUS
  Filled 2016-03-16: qty 5

## 2016-03-16 MED ORDER — CYANOCOBALAMIN 1000 MCG/ML IJ SOLN
1000.0000 ug | Freq: Once | INTRAMUSCULAR | Status: AC
  Administered 2016-03-16: 1000 ug via INTRAMUSCULAR
  Filled 2016-03-16: qty 1

## 2016-03-16 MED ORDER — SODIUM CHLORIDE 0.9 % IV SOLN
380.0000 mg | Freq: Once | INTRAVENOUS | Status: AC
  Administered 2016-03-16: 380 mg via INTRAVENOUS
  Filled 2016-03-16: qty 38

## 2016-03-16 MED ORDER — POTASSIUM CHLORIDE ER 10 MEQ PO TBCR: 10.0000 meq | EXTENDED_RELEASE_TABLET | Freq: Two times a day (BID) | ORAL | 0 refills | Status: DC

## 2016-03-16 MED ORDER — PEMETREXED DISODIUM CHEMO INJECTION 500 MG
900.0000 mg | Freq: Once | INTRAVENOUS | Status: AC
  Administered 2016-03-16: 900 mg via INTRAVENOUS
  Filled 2016-03-16: qty 20

## 2016-03-16 MED ORDER — SODIUM CHLORIDE 0.9 % IV SOLN
Freq: Once | INTRAVENOUS | Status: AC
  Administered 2016-03-16: 11:00:00 via INTRAVENOUS
  Filled 2016-03-16: qty 1000

## 2016-03-16 MED ORDER — PEMBROLIZUMAB CHEMO INJECTION 100 MG/4ML
200.0000 mg | Freq: Once | INTRAVENOUS | Status: AC
  Administered 2016-03-16: 200 mg via INTRAVENOUS
  Filled 2016-03-16: qty 8

## 2016-03-16 MED ORDER — HEPARIN SOD (PORK) LOCK FLUSH 100 UNIT/ML IV SOLN
500.0000 [IU] | Freq: Once | INTRAVENOUS | Status: AC
  Administered 2016-03-16: 500 [IU] via INTRAVENOUS
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
## 2016-03-16 NOTE — Progress Notes (Signed)
Patient complains of fatigue.  She also has leg pain.  States her appetite is not good the week after treatment.

## 2016-03-17 ENCOUNTER — Encounter: Payer: Self-pay | Admitting: Hematology and Oncology

## 2016-03-19 DIAGNOSIS — R946 Abnormal results of thyroid function studies: Secondary | ICD-10-CM | POA: Diagnosis not present

## 2016-03-19 DIAGNOSIS — C3491 Malignant neoplasm of unspecified part of right bronchus or lung: Secondary | ICD-10-CM | POA: Diagnosis not present

## 2016-03-19 DIAGNOSIS — R7989 Other specified abnormal findings of blood chemistry: Secondary | ICD-10-CM | POA: Diagnosis not present

## 2016-03-27 ENCOUNTER — Inpatient Hospital Stay: Payer: BLUE CROSS/BLUE SHIELD

## 2016-03-27 DIAGNOSIS — E876 Hypokalemia: Secondary | ICD-10-CM

## 2016-03-27 DIAGNOSIS — C3431 Malignant neoplasm of lower lobe, right bronchus or lung: Secondary | ICD-10-CM

## 2016-03-27 LAB — BASIC METABOLIC PANEL
Anion gap: 7 (ref 5–15)
BUN: 10 mg/dL (ref 6–20)
CO2: 24 mmol/L (ref 22–32)
Calcium: 8.8 mg/dL — ABNORMAL LOW (ref 8.9–10.3)
Chloride: 107 mmol/L (ref 101–111)
Creatinine, Ser: 0.9 mg/dL (ref 0.44–1.00)
GFR calc Af Amer: >60 mL/min (ref >60)
GFR calc non Af Amer: >60 mL/min (ref >60)
Glucose, Bld: 116 mg/dL — ABNORMAL HIGH (ref 65–99)
Potassium: 3.7 mmol/L (ref 3.5–5.1)
Sodium: 138 mmol/L (ref 135–145)

## 2016-03-27 LAB — MAGNESIUM: Magnesium: 1.3 mg/dL — ABNORMAL LOW (ref 1.7–2.4)

## 2016-03-27 MED ORDER — SODIUM CHLORIDE 0.9 % IV SOLN
Freq: Once | INTRAVENOUS | Status: AC
  Administered 2016-03-27: 10:00:00 via INTRAVENOUS
  Filled 2016-03-27: qty 15

## 2016-03-27 MED ORDER — SODIUM CHLORIDE 0.9% FLUSH
10.0000 mL | Freq: Once | INTRAVENOUS | Status: AC | PRN
  Administered 2016-03-27: 10 mL
  Filled 2016-03-27: qty 10

## 2016-03-27 MED ORDER — SODIUM CHLORIDE 0.9 % IV SOLN
Freq: Once | INTRAVENOUS | Status: AC
  Administered 2016-03-27: 10:00:00 via INTRAVENOUS
  Filled 2016-03-27: qty 1000

## 2016-03-27 MED ORDER — HEPARIN SOD (PORK) LOCK FLUSH 100 UNIT/ML IV SOLN
500.0000 [IU] | Freq: Once | INTRAVENOUS | Status: AC | PRN
  Administered 2016-03-27: 500 [IU]

## 2016-04-02 DIAGNOSIS — R7989 Other specified abnormal findings of blood chemistry: Secondary | ICD-10-CM | POA: Diagnosis not present

## 2016-04-02 DIAGNOSIS — R946 Abnormal results of thyroid function studies: Secondary | ICD-10-CM | POA: Diagnosis not present

## 2016-04-06 ENCOUNTER — Encounter: Payer: Self-pay | Admitting: Hematology and Oncology

## 2016-04-06 ENCOUNTER — Inpatient Hospital Stay: Payer: BLUE CROSS/BLUE SHIELD | Attending: Hematology and Oncology

## 2016-04-06 ENCOUNTER — Other Ambulatory Visit: Payer: Self-pay | Admitting: Hematology and Oncology

## 2016-04-06 ENCOUNTER — Inpatient Hospital Stay: Payer: BLUE CROSS/BLUE SHIELD

## 2016-04-06 ENCOUNTER — Inpatient Hospital Stay (HOSPITAL_BASED_OUTPATIENT_CLINIC_OR_DEPARTMENT_OTHER): Payer: BLUE CROSS/BLUE SHIELD | Admitting: Hematology and Oncology

## 2016-04-06 VITALS — BP 144/80 | HR 101 | Temp 98.0°F | Resp 18 | Wt 143.5 lb

## 2016-04-06 DIAGNOSIS — Z79899 Other long term (current) drug therapy: Secondary | ICD-10-CM | POA: Diagnosis not present

## 2016-04-06 DIAGNOSIS — T451X5S Adverse effect of antineoplastic and immunosuppressive drugs, sequela: Secondary | ICD-10-CM

## 2016-04-06 DIAGNOSIS — F17210 Nicotine dependence, cigarettes, uncomplicated: Secondary | ICD-10-CM | POA: Insufficient documentation

## 2016-04-06 DIAGNOSIS — C3431 Malignant neoplasm of lower lobe, right bronchus or lung: Secondary | ICD-10-CM

## 2016-04-06 DIAGNOSIS — Z7982 Long term (current) use of aspirin: Secondary | ICD-10-CM | POA: Diagnosis not present

## 2016-04-06 DIAGNOSIS — E876 Hypokalemia: Secondary | ICD-10-CM

## 2016-04-06 DIAGNOSIS — R7989 Other specified abnormal findings of blood chemistry: Secondary | ICD-10-CM

## 2016-04-06 DIAGNOSIS — R634 Abnormal weight loss: Secondary | ICD-10-CM | POA: Insufficient documentation

## 2016-04-06 DIAGNOSIS — I739 Peripheral vascular disease, unspecified: Secondary | ICD-10-CM

## 2016-04-06 DIAGNOSIS — Z5111 Encounter for antineoplastic chemotherapy: Secondary | ICD-10-CM | POA: Insufficient documentation

## 2016-04-06 DIAGNOSIS — R590 Localized enlarged lymph nodes: Secondary | ICD-10-CM

## 2016-04-06 DIAGNOSIS — Z5112 Encounter for antineoplastic immunotherapy: Secondary | ICD-10-CM | POA: Diagnosis not present

## 2016-04-06 DIAGNOSIS — Z7689 Persons encountering health services in other specified circumstances: Secondary | ICD-10-CM | POA: Insufficient documentation

## 2016-04-06 LAB — CBC WITH DIFFERENTIAL/PLATELET
Basophils Absolute: 0 10*3/uL (ref 0–0.1)
Basophils Relative: 0 %
Eosinophils Absolute: 0 10*3/uL (ref 0–0.7)
Eosinophils Relative: 0 %
HCT: 30.1 % — ABNORMAL LOW (ref 35.0–47.0)
Hemoglobin: 10.2 g/dL — ABNORMAL LOW (ref 12.0–16.0)
Lymphocytes Relative: 15 %
Lymphs Abs: 0.9 10*3/uL — ABNORMAL LOW (ref 1.0–3.6)
MCH: 31.3 pg (ref 26.0–34.0)
MCHC: 34 g/dL (ref 32.0–36.0)
MCV: 91.9 fL (ref 80.0–100.0)
Monocytes Absolute: 0.9 10*3/uL (ref 0.2–0.9)
Monocytes Relative: 15 %
Neutro Abs: 4.1 10*3/uL (ref 1.4–6.5)
Neutrophils Relative %: 70 %
Platelets: 340 10*3/uL (ref 150–440)
RBC: 3.27 MIL/uL — ABNORMAL LOW (ref 3.80–5.20)
RDW: 23.3 % — ABNORMAL HIGH (ref 11.5–14.5)
WBC: 5.9 10*3/uL (ref 3.6–11.0)

## 2016-04-06 LAB — MAGNESIUM: Magnesium: 1.6 mg/dL — ABNORMAL LOW (ref 1.7–2.4)

## 2016-04-06 LAB — COMPREHENSIVE METABOLIC PANEL
ALT: 24 U/L (ref 14–54)
AST: 35 U/L (ref 15–41)
Albumin: 3.8 g/dL (ref 3.5–5.0)
Alkaline Phosphatase: 92 U/L (ref 38–126)
Anion gap: 7 (ref 5–15)
BUN: 14 mg/dL (ref 6–20)
CO2: 23 mmol/L (ref 22–32)
Calcium: 9.4 mg/dL (ref 8.9–10.3)
Chloride: 105 mmol/L (ref 101–111)
Creatinine, Ser: 1.03 mg/dL — ABNORMAL HIGH (ref 0.44–1.00)
GFR calc Af Amer: >60 mL/min (ref >60)
GFR calc non Af Amer: 53 mL/min — ABNORMAL LOW (ref >60)
Glucose, Bld: 143 mg/dL — ABNORMAL HIGH (ref 65–99)
Potassium: 3.6 mmol/L (ref 3.5–5.1)
Sodium: 135 mmol/L (ref 135–145)
Total Bilirubin: 0.5 mg/dL (ref 0.3–1.2)
Total Protein: 7.2 g/dL (ref 6.5–8.1)

## 2016-04-06 LAB — TSH: TSH: 0.262 u[IU]/mL — ABNORMAL LOW (ref 0.350–4.500)

## 2016-04-06 LAB — T4, FREE: Free T4: 1.02 ng/dL (ref 0.61–1.12)

## 2016-04-06 MED ORDER — MAGNESIUM OXIDE 400 (241.3 MG) MG PO TABS: 400.0000 mg | ORAL_TABLET | Freq: Every day | ORAL | 1 refills | Status: DC

## 2016-04-06 MED ORDER — HEPARIN SOD (PORK) LOCK FLUSH 100 UNIT/ML IV SOLN: 500.0000 [IU] | Freq: Once | INTRAVENOUS | Status: DC | PRN

## 2016-04-06 MED ORDER — PALONOSETRON HCL INJECTION 0.25 MG/5ML
0.2500 mg | Freq: Once | INTRAVENOUS | Status: AC
  Administered 2016-04-06: 0.25 mg via INTRAVENOUS
  Filled 2016-04-06: qty 5

## 2016-04-06 MED ORDER — SODIUM CHLORIDE 0.9 % IV SOLN
900.0000 mg | Freq: Once | INTRAVENOUS | Status: AC
  Administered 2016-04-06: 900 mg via INTRAVENOUS
  Filled 2016-04-06: qty 16

## 2016-04-06 MED ORDER — MAGNESIUM SULFATE 50 % IJ SOLN
1.0000 g | Freq: Once | INTRAVENOUS | Status: AC
  Administered 2016-04-06: 1 g via INTRAVENOUS
  Filled 2016-04-06: qty 2

## 2016-04-06 MED ORDER — HEPARIN SOD (PORK) LOCK FLUSH 100 UNIT/ML IV SOLN
500.0000 [IU] | Freq: Once | INTRAVENOUS | Status: AC
  Administered 2016-04-06: 500 [IU] via INTRAVENOUS
  Filled 2016-04-06: qty 5

## 2016-04-06 MED ORDER — SODIUM CHLORIDE 0.9 % IV SOLN
Freq: Once | INTRAVENOUS | Status: AC
  Administered 2016-04-06: 10:00:00 via INTRAVENOUS
  Filled 2016-04-06: qty 1000

## 2016-04-06 MED ORDER — SODIUM CHLORIDE 0.9 % IV SOLN
200.0000 mg | Freq: Once | INTRAVENOUS | Status: AC
  Administered 2016-04-06: 200 mg via INTRAVENOUS
  Filled 2016-04-06: qty 8

## 2016-04-06 MED ORDER — SODIUM CHLORIDE 0.9% FLUSH
10.0000 mL | INTRAVENOUS | Status: DC | PRN
  Filled 2016-04-06: qty 10

## 2016-04-06 MED ORDER — SODIUM CHLORIDE 0.9 % IV SOLN
Freq: Once | INTRAVENOUS | Status: AC
  Administered 2016-04-06: 11:00:00 via INTRAVENOUS
  Filled 2016-04-06: qty 5

## 2016-04-06 MED ORDER — SODIUM CHLORIDE 0.9% FLUSH
10.0000 mL | Freq: Once | INTRAVENOUS | Status: AC
  Administered 2016-04-06: 10 mL via INTRAVENOUS
  Filled 2016-04-06: qty 10

## 2016-04-06 MED ORDER — POTASSIUM CHLORIDE ER 10 MEQ PO TBCR
10.0000 meq | EXTENDED_RELEASE_TABLET | Freq: Two times a day (BID) | ORAL | 1 refills | Status: DC
Start: 2016-04-06 — End: 2016-06-14

## 2016-04-06 MED ORDER — SODIUM CHLORIDE 0.9 % IV SOLN
380.0000 mg | Freq: Once | INTRAVENOUS | Status: AC
  Administered 2016-04-06: 380 mg via INTRAVENOUS
  Filled 2016-04-06: qty 38
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
## 2016-04-06 NOTE — Progress Notes (Signed)
Patient offers no complaints today. 

## 2016-04-13 ENCOUNTER — Inpatient Hospital Stay: Payer: BLUE CROSS/BLUE SHIELD

## 2016-04-13 DIAGNOSIS — E876 Hypokalemia: Secondary | ICD-10-CM

## 2016-04-13 DIAGNOSIS — C3431 Malignant neoplasm of lower lobe, right bronchus or lung: Secondary | ICD-10-CM

## 2016-04-13 DIAGNOSIS — R7989 Other specified abnormal findings of blood chemistry: Secondary | ICD-10-CM

## 2016-04-13 DIAGNOSIS — Z5111 Encounter for antineoplastic chemotherapy: Secondary | ICD-10-CM

## 2016-04-13 LAB — BASIC METABOLIC PANEL
Anion gap: 6 (ref 5–15)
BUN: 17 mg/dL (ref 6–20)
CO2: 26 mmol/L (ref 22–32)
Calcium: 8.9 mg/dL (ref 8.9–10.3)
Chloride: 101 mmol/L (ref 101–111)
Creatinine, Ser: 0.95 mg/dL (ref 0.44–1.00)
GFR calc Af Amer: >60 mL/min (ref >60)
GFR calc non Af Amer: 59 mL/min — ABNORMAL LOW (ref >60)
Glucose, Bld: 112 mg/dL — ABNORMAL HIGH (ref 65–99)
Potassium: 3.8 mmol/L (ref 3.5–5.1)
Sodium: 133 mmol/L — ABNORMAL LOW (ref 135–145)

## 2016-04-13 LAB — MAGNESIUM: Magnesium: 1.8 mg/dL (ref 1.7–2.4)

## 2016-04-24 ENCOUNTER — Ambulatory Visit
Admission: RE | Admit: 2016-04-24 | Discharge: 2016-04-24 | Disposition: A | Discharge status: Home / Self Care | Payer: BLUE CROSS/BLUE SHIELD | Source: Ambulatory Visit | Attending: Hematology and Oncology | Admitting: Hematology and Oncology

## 2016-04-24 DIAGNOSIS — E786 Lipoprotein deficiency: Secondary | ICD-10-CM | POA: Insufficient documentation

## 2016-04-24 DIAGNOSIS — R918 Other nonspecific abnormal finding of lung field: Secondary | ICD-10-CM | POA: Insufficient documentation

## 2016-04-24 DIAGNOSIS — Z5111 Encounter for antineoplastic chemotherapy: Secondary | ICD-10-CM

## 2016-04-24 DIAGNOSIS — R7989 Other specified abnormal findings of blood chemistry: Secondary | ICD-10-CM

## 2016-04-24 DIAGNOSIS — C3431 Malignant neoplasm of lower lobe, right bronchus or lung: Secondary | ICD-10-CM

## 2016-04-24 DIAGNOSIS — R946 Abnormal results of thyroid function studies: Secondary | ICD-10-CM | POA: Diagnosis not present

## 2016-04-24 DIAGNOSIS — E876 Hypokalemia: Secondary | ICD-10-CM

## 2016-04-24 LAB — GLUCOSE, CAPILLARY: Glucose-Capillary: 115 mg/dL — ABNORMAL HIGH (ref 65–99)

## 2016-04-24 MED ORDER — FLUDEOXYGLUCOSE F - 18 (FDG) INJECTION
12.7500 | Freq: Once | INTRAVENOUS | Status: AC | PRN
  Administered 2016-04-24: 12.75 via INTRAVENOUS

## 2016-04-26 ENCOUNTER — Inpatient Hospital Stay: Payer: BLUE CROSS/BLUE SHIELD

## 2016-04-26 ENCOUNTER — Inpatient Hospital Stay (HOSPITAL_BASED_OUTPATIENT_CLINIC_OR_DEPARTMENT_OTHER): Payer: BLUE CROSS/BLUE SHIELD | Admitting: Hematology and Oncology

## 2016-04-26 VITALS — BP 123/77 | HR 101 | Temp 96.8°F | Resp 18 | Wt 143.5 lb

## 2016-04-26 DIAGNOSIS — Z5112 Encounter for antineoplastic immunotherapy: Secondary | ICD-10-CM | POA: Diagnosis not present

## 2016-04-26 DIAGNOSIS — C3431 Malignant neoplasm of lower lobe, right bronchus or lung: Secondary | ICD-10-CM

## 2016-04-26 DIAGNOSIS — E876 Hypokalemia: Secondary | ICD-10-CM

## 2016-04-26 DIAGNOSIS — F17210 Nicotine dependence, cigarettes, uncomplicated: Secondary | ICD-10-CM

## 2016-04-26 DIAGNOSIS — Z7982 Long term (current) use of aspirin: Secondary | ICD-10-CM

## 2016-04-26 DIAGNOSIS — Z5111 Encounter for antineoplastic chemotherapy: Secondary | ICD-10-CM

## 2016-04-26 DIAGNOSIS — T451X5S Adverse effect of antineoplastic and immunosuppressive drugs, sequela: Secondary | ICD-10-CM

## 2016-04-26 DIAGNOSIS — Z7689 Persons encountering health services in other specified circumstances: Secondary | ICD-10-CM | POA: Diagnosis not present

## 2016-04-26 DIAGNOSIS — Z79899 Other long term (current) drug therapy: Secondary | ICD-10-CM

## 2016-04-26 DIAGNOSIS — R590 Localized enlarged lymph nodes: Secondary | ICD-10-CM

## 2016-04-26 DIAGNOSIS — R634 Abnormal weight loss: Secondary | ICD-10-CM

## 2016-04-26 DIAGNOSIS — I739 Peripheral vascular disease, unspecified: Secondary | ICD-10-CM

## 2016-04-26 DIAGNOSIS — R7989 Other specified abnormal findings of blood chemistry: Secondary | ICD-10-CM

## 2016-04-26 DIAGNOSIS — Z7189 Other specified counseling: Secondary | ICD-10-CM

## 2016-04-26 LAB — CBC WITH DIFFERENTIAL/PLATELET
Basophils Absolute: 0 10*3/uL (ref 0–0.1)
Basophils Relative: 0 %
Eosinophils Absolute: 0 10*3/uL (ref 0–0.7)
Eosinophils Relative: 0 %
HCT: 27.1 % — ABNORMAL LOW (ref 35.0–47.0)
Hemoglobin: 9.2 g/dL — ABNORMAL LOW (ref 12.0–16.0)
Lymphocytes Relative: 12 %
Lymphs Abs: 1.1 10*3/uL (ref 1.0–3.6)
MCH: 32.9 pg (ref 26.0–34.0)
MCHC: 34 g/dL (ref 32.0–36.0)
MCV: 96.8 fL (ref 80.0–100.0)
Monocytes Absolute: 1.4 10*3/uL — ABNORMAL HIGH (ref 0.2–0.9)
Monocytes Relative: 15 %
Neutro Abs: 6.6 10*3/uL — ABNORMAL HIGH (ref 1.4–6.5)
Neutrophils Relative %: 73 %
Platelets: 345 10*3/uL (ref 150–440)
RBC: 2.8 MIL/uL — ABNORMAL LOW (ref 3.80–5.20)
RDW: 24.2 % — ABNORMAL HIGH (ref 11.5–14.5)
WBC: 9.1 10*3/uL (ref 3.6–11.0)

## 2016-04-26 LAB — COMPREHENSIVE METABOLIC PANEL
ALT: 17 U/L (ref 14–54)
AST: 25 U/L (ref 15–41)
Albumin: 3.9 g/dL (ref 3.5–5.0)
Alkaline Phosphatase: 83 U/L (ref 38–126)
Anion gap: 9 (ref 5–15)
BUN: 16 mg/dL (ref 6–20)
CO2: 20 mmol/L — ABNORMAL LOW (ref 22–32)
Calcium: 9.7 mg/dL (ref 8.9–10.3)
Chloride: 106 mmol/L (ref 101–111)
Creatinine, Ser: 1.12 mg/dL — ABNORMAL HIGH (ref 0.44–1.00)
GFR calc Af Amer: 56 mL/min — ABNORMAL LOW (ref >60)
GFR calc non Af Amer: 48 mL/min — ABNORMAL LOW (ref >60)
Glucose, Bld: 107 mg/dL — ABNORMAL HIGH (ref 65–99)
Potassium: 3.6 mmol/L (ref 3.5–5.1)
Sodium: 135 mmol/L (ref 135–145)
Total Bilirubin: 0.5 mg/dL (ref 0.3–1.2)
Total Protein: 7.8 g/dL (ref 6.5–8.1)

## 2016-04-26 LAB — MAGNESIUM: Magnesium: 1.9 mg/dL (ref 1.7–2.4)

## 2016-04-26 MED ORDER — HEPARIN SOD (PORK) LOCK FLUSH 100 UNIT/ML IV SOLN
500.0000 [IU] | Freq: Once | INTRAVENOUS | Status: AC
  Administered 2016-04-26: 500 [IU] via INTRAVENOUS

## 2016-04-26 MED ORDER — FOLIC ACID 1 MG PO TABS: 1.0000 mg | ORAL_TABLET | Freq: Every day | ORAL | 3 refills | Status: DC

## 2016-04-26 MED ORDER — SODIUM CHLORIDE 0.9% FLUSH
10.0000 mL | Freq: Once | INTRAVENOUS | Status: AC
  Administered 2016-04-26: 10 mL via INTRAVENOUS
  Filled 2016-04-26: qty 10

## 2016-04-26 NOTE — Progress Notes (Signed)
Patient here today for PET results.  Requesting refill for folic acid.
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
## 2016-05-03 ENCOUNTER — Telehealth: Payer: Self-pay | Admitting: *Deleted

## 2016-05-03 NOTE — Telephone Encounter (Signed)
Returned patients call about asking about testing and fmla papers being completed.  Patient informed that we do not have the fmla papers from her company at this time and that we haven't heard from the labs at this time. We will follow up with Dr. Mike Gip and get back to her.

## 2016-05-03 NOTE — Telephone Encounter (Signed)
Asking what is to be happening with her and her care, she was told that she would her back regarding a clinical trial at [facility] vs some panel being done vs a stronger chemo regimen and she wants to know what we are going to do.Please return her call [phone].   Also needs Intermittent FMLA paperwork completed, she will be going back to work Tuesday. Papers were supposed to be faxed to [place] yesterday.

## 2016-05-03 NOTE — Telephone Encounter (Signed)
Returned patients call about asking about testing and fmla papers being completed.  Patient informed that we do not have the fmla papers from her company at this time and that we haven't heard from the labs at this time. We will follow up with Dr. Mike Gip and get back to her. Company to send [place] the [employer].

## 2016-05-11 ENCOUNTER — Telehealth: Payer: Self-pay | Admitting: *Deleted

## 2016-05-11 NOTE — Telephone Encounter (Signed)
Called to inquire what is going to happen next, please call and advise [phone]  She has no FU appts scheduled here and was last seen 3/29

## 2016-05-11 NOTE — Telephone Encounter (Signed)
Dr. Mike Gip called and spoke with the patient

## 2016-05-13 ENCOUNTER — Other Ambulatory Visit: Payer: Self-pay | Admitting: Hematology and Oncology

## 2016-05-13 DIAGNOSIS — C3431 Malignant neoplasm of lower lobe, right bronchus or lung: Secondary | ICD-10-CM

## 2016-05-13 NOTE — Progress Notes (Signed)
DISCONTINUE OFF PATHWAY REGIMEN - Non-Small Cell Lung     A cycle is every 21 days:     Pemetrexed        Dose Mod: None     Carboplatin        Dose Mod: None  **Always confirm dose/schedule in your pharmacy ordering system**    REASON: Disease Progression PRIOR TREATMENT: [id number]: Carboplatin AUC=5 + Pemetrexed 500 mg/m2 q21 Days x 4 Cycles TREATMENT RESPONSE: Progressive Disease (PD)  START OFF PATHWAY REGIMEN - Non-Small Cell Lung   [id number]:Docetaxel + Ramucirumab q21 Days:   A cycle is every 21 days:     Ramucirumab      Docetaxel   **Always confirm dose/schedule in your pharmacy ordering system**    Patient Characteristics: Stage IV Metastatic, Non Squamous, Second Line - Chemotherapy/Immunotherapy, PS = 0, 1, Prior PD-1/PD-L1 Inhibitor AJCC T Category: T3 Current Disease Status: Distant Metastases AJCC N Category: N0 AJCC M Category: M1b AJCC 8 Stage Grouping: IVA Histology: Non Squamous Cell ROS1 Rearrangement Status: Negative T790M Mutation Status: Not Applicable - EGFR Mutation Negative/Unknown Other Mutations/Biomarkers: No Other Actionable Mutations PD-L1 Expression Status: PD-L1 Positive 1-49% (TPS) Chemotherapy/Immunotherapy LOT: Second Line Chemotherapy/Immunotherapy Molecular Targeted Therapy: Not Appropriate ALK Translocation Status: Negative Would you be surprised if this patient died  in the next year? I would be surprised if this patient died in the next year EGFR Mutation Status: Negative/Wild Type BRAF V600E Mutation Status: Negative Performance Status: PS = 0, 1 Immunotherapy Candidate Status: Not a Candidate for Immunotherapy Prior Immunotherapy Status: Prior PD-1/PD-L1 Inhibitor  Intent of Therapy: Non-Curative / Palliative Intent, Discussed with Patient

## 2016-05-18 ENCOUNTER — Telehealth: Payer: Self-pay | Admitting: *Deleted

## 2016-05-18 NOTE — Telephone Encounter (Signed)
Asking if we know what is going on yet and if we have heard anything. Also asking if we have sent her insurance  papers to [employer] yet. Please return her call [phone]

## 2016-05-18 NOTE — Telephone Encounter (Signed)
Per Dr Mike Gip, she just received e-mail from Dr Aniceto Boss, and he does not have a spot on clinical trial for 4 - 6 weeks, he agrees with Taxane and Ramisubimab, I have discussed getting Auth with Elliot Gault and she will check into it and let us know once approved or declined. Patient informed of situation and while not happy about waiting longer she understands

## 2016-05-20 ENCOUNTER — Other Ambulatory Visit: Payer: Self-pay | Admitting: Hematology and Oncology

## 2016-05-20 DIAGNOSIS — C3431 Malignant neoplasm of lower lobe, right bronchus or lung: Secondary | ICD-10-CM

## 2016-05-20 MED ORDER — DEXAMETHASONE 4 MG PO TABS: 8.0000 mg | ORAL_TABLET | Freq: Two times a day (BID) | ORAL | 1 refills | Status: DC

## 2016-05-21 ENCOUNTER — Telehealth: Payer: Self-pay | Admitting: *Deleted

## 2016-05-21 NOTE — Telephone Encounter (Signed)
Called patient to inform her that her fmla papers had been sent to her company on the 10th, and to check with them, voiced understanding, pt informed about upcoming appt for Friday voiced understanding.

## 2016-05-22 ENCOUNTER — Ambulatory Visit: Payer: BLUE CROSS/BLUE SHIELD

## 2016-05-22 ENCOUNTER — Ambulatory Visit: Payer: BLUE CROSS/BLUE SHIELD | Admitting: Hematology and Oncology

## 2016-05-22 ENCOUNTER — Other Ambulatory Visit: Payer: BLUE CROSS/BLUE SHIELD

## 2016-05-25 ENCOUNTER — Inpatient Hospital Stay (HOSPITAL_BASED_OUTPATIENT_CLINIC_OR_DEPARTMENT_OTHER): Payer: BLUE CROSS/BLUE SHIELD | Admitting: Hematology and Oncology

## 2016-05-25 ENCOUNTER — Encounter: Payer: Self-pay | Admitting: Hematology and Oncology

## 2016-05-25 ENCOUNTER — Inpatient Hospital Stay: Payer: BLUE CROSS/BLUE SHIELD

## 2016-05-25 ENCOUNTER — Inpatient Hospital Stay: Payer: BLUE CROSS/BLUE SHIELD | Attending: Hematology and Oncology

## 2016-05-25 ENCOUNTER — Other Ambulatory Visit: Payer: Self-pay | Admitting: Hematology and Oncology

## 2016-05-25 VITALS — BP 132/82 | HR 98 | Temp 97.1°F | Resp 18 | Wt 145.7 lb

## 2016-05-25 DIAGNOSIS — Z7982 Long term (current) use of aspirin: Secondary | ICD-10-CM | POA: Diagnosis not present

## 2016-05-25 DIAGNOSIS — Z7689 Persons encountering health services in other specified circumstances: Secondary | ICD-10-CM | POA: Diagnosis not present

## 2016-05-25 DIAGNOSIS — E7800 Pure hypercholesterolemia, unspecified: Secondary | ICD-10-CM | POA: Diagnosis not present

## 2016-05-25 DIAGNOSIS — N2889 Other specified disorders of kidney and ureter: Secondary | ICD-10-CM

## 2016-05-25 DIAGNOSIS — Z5111 Encounter for antineoplastic chemotherapy: Secondary | ICD-10-CM | POA: Insufficient documentation

## 2016-05-25 DIAGNOSIS — J439 Emphysema, unspecified: Secondary | ICD-10-CM

## 2016-05-25 DIAGNOSIS — I739 Peripheral vascular disease, unspecified: Secondary | ICD-10-CM | POA: Diagnosis not present

## 2016-05-25 DIAGNOSIS — Z87891 Personal history of nicotine dependence: Secondary | ICD-10-CM

## 2016-05-25 DIAGNOSIS — Z79899 Other long term (current) drug therapy: Secondary | ICD-10-CM

## 2016-05-25 DIAGNOSIS — R590 Localized enlarged lymph nodes: Secondary | ICD-10-CM | POA: Insufficient documentation

## 2016-05-25 DIAGNOSIS — I10 Essential (primary) hypertension: Secondary | ICD-10-CM | POA: Diagnosis not present

## 2016-05-25 DIAGNOSIS — C3431 Malignant neoplasm of lower lobe, right bronchus or lung: Secondary | ICD-10-CM

## 2016-05-25 DIAGNOSIS — E876 Hypokalemia: Secondary | ICD-10-CM

## 2016-05-25 DIAGNOSIS — Z7189 Other specified counseling: Secondary | ICD-10-CM

## 2016-05-25 DIAGNOSIS — Z85828 Personal history of other malignant neoplasm of skin: Secondary | ICD-10-CM | POA: Diagnosis not present

## 2016-05-25 LAB — MAGNESIUM: Magnesium: 1.9 mg/dL (ref 1.7–2.4)

## 2016-05-25 LAB — COMPREHENSIVE METABOLIC PANEL
ALT: 18 U/L (ref 14–54)
AST: 30 U/L (ref 15–41)
Albumin: 3.9 g/dL (ref 3.5–5.0)
Alkaline Phosphatase: 91 U/L (ref 38–126)
Anion gap: 9 (ref 5–15)
BUN: 21 mg/dL — ABNORMAL HIGH (ref 6–20)
CO2: 22 mmol/L (ref 22–32)
Calcium: 9.7 mg/dL (ref 8.9–10.3)
Chloride: 107 mmol/L (ref 101–111)
Creatinine, Ser: 1.04 mg/dL — ABNORMAL HIGH (ref 0.44–1.00)
GFR calc Af Amer: >60 mL/min (ref >60)
GFR calc non Af Amer: 53 mL/min — ABNORMAL LOW (ref >60)
Glucose, Bld: 174 mg/dL — ABNORMAL HIGH (ref 65–99)
Potassium: 3.6 mmol/L (ref 3.5–5.1)
Sodium: 138 mmol/L (ref 135–145)
Total Bilirubin: 0.5 mg/dL (ref 0.3–1.2)
Total Protein: 7.4 g/dL (ref 6.5–8.1)

## 2016-05-25 LAB — CBC WITH DIFFERENTIAL/PLATELET
Basophils Absolute: 0 10*3/uL (ref 0–0.1)
Basophils Relative: 0 %
Eosinophils Absolute: 0 10*3/uL (ref 0–0.7)
Eosinophils Relative: 0 %
HCT: 31.8 % — ABNORMAL LOW (ref 35.0–47.0)
Hemoglobin: 10.8 g/dL — ABNORMAL LOW (ref 12.0–16.0)
Lymphocytes Relative: 4 %
Lymphs Abs: 0.6 10*3/uL — ABNORMAL LOW (ref 1.0–3.6)
MCH: 34.8 pg — ABNORMAL HIGH (ref 26.0–34.0)
MCHC: 34.1 g/dL (ref 32.0–36.0)
MCV: 102 fL — ABNORMAL HIGH (ref 80.0–100.0)
Monocytes Absolute: 0.6 10*3/uL (ref 0.2–0.9)
Monocytes Relative: 4 %
Neutro Abs: 13.4 10*3/uL — ABNORMAL HIGH (ref 1.4–6.5)
Neutrophils Relative %: 92 %
Platelets: 365 10*3/uL (ref 150–440)
RBC: 3.12 MIL/uL — ABNORMAL LOW (ref 3.80–5.20)
RDW: 17.6 % — ABNORMAL HIGH (ref 11.5–14.5)
WBC: 14.6 10*3/uL — ABNORMAL HIGH (ref 3.6–11.0)

## 2016-05-25 MED ORDER — HEPARIN SOD (PORK) LOCK FLUSH 100 UNIT/ML IV SOLN
500.0000 [IU] | Freq: Once | INTRAVENOUS | Status: AC
  Administered 2016-05-25: 500 [IU] via INTRAVENOUS

## 2016-05-25 MED ORDER — SODIUM CHLORIDE 0.9 % IV SOLN
Freq: Once | INTRAVENOUS | Status: AC
  Administered 2016-05-25: 10:00:00 via INTRAVENOUS
  Filled 2016-05-25: qty 1000

## 2016-05-25 MED ORDER — ACETAMINOPHEN 325 MG PO TABS
650.0000 mg | ORAL_TABLET | Freq: Once | ORAL | Status: AC
  Administered 2016-05-25: 650 mg via ORAL
  Filled 2016-05-25: qty 2

## 2016-05-25 MED ORDER — SODIUM CHLORIDE 0.9% FLUSH
10.0000 mL | INTRAVENOUS | Status: DC | PRN
  Administered 2016-05-25: 10 mL via INTRAVENOUS
  Filled 2016-05-25: qty 10

## 2016-05-25 MED ORDER — DEXAMETHASONE SODIUM PHOSPHATE 10 MG/ML IJ SOLN
10.0000 mg | Freq: Once | INTRAMUSCULAR | Status: AC
  Administered 2016-05-25: 10 mg via INTRAVENOUS
  Filled 2016-05-25: qty 1

## 2016-05-25 MED ORDER — PEGFILGRASTIM 6 MG/0.6ML ~~LOC~~ PSKT
6.0000 mg | PREFILLED_SYRINGE | Freq: Once | SUBCUTANEOUS | Status: AC
  Administered 2016-05-25: 6 mg via SUBCUTANEOUS
  Filled 2016-05-25: qty 0.6

## 2016-05-25 MED ORDER — DIPHENHYDRAMINE HCL 50 MG/ML IJ SOLN
50.0000 mg | Freq: Once | INTRAMUSCULAR | Status: AC
  Administered 2016-05-25: 50 mg via INTRAVENOUS
  Filled 2016-05-25: qty 1

## 2016-05-25 MED ORDER — SODIUM CHLORIDE 0.9 % IV SOLN: 10.0000 mg | Freq: Once | INTRAVENOUS | Status: DC

## 2016-05-25 MED ORDER — SODIUM CHLORIDE 0.9 % IV SOLN
75.0000 mg/m2 | Freq: Once | INTRAVENOUS | Status: AC
  Administered 2016-05-25: 130 mg via INTRAVENOUS
  Filled 2016-05-25: qty 13

## 2016-05-25 NOTE — Progress Notes (Signed)
Here pt relates before starting new chemo regime.

## 2016-05-25 NOTE — Progress Notes (Signed)
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 cm lateral retropharyngeal lymph node (stable), increase in  size of nodules in the lingula and LLL and persistent nodular consolidation in the RLL.  Mediastinal adenopathy was stable.  Pretreatment labs included a TSH 0.278 (low), free T4 1.0 (normal), and ACTH < 1.1 (low).  ACTH was inadvertantly drawn the morning after receiving Decadron.  Follow-up early am cortisol on 02/07/2016 was 20.3 (6.7-22.6).    She received 4 cycles of carboplatin, Alimta, and Keytruda (02/03/2016 - 04/06/2016).   PET scan on 04/24/2016 revealed new and enlarging bilateral pulmonary nodules compared to the prior exam, hypermetabolic, compatible with metastatic disease in the lungs.  There was low-grade metabolic activity in mediastinal lymph nodes. There continues to be a nodular  structure favoring a neck lymph node in the upper left station IIa region, mildly hypermetabolic, likely reflecting a metastatic lymph node.  There was no evidence of  Symptomatically, she is doing well.  Exam reveals intermittent right lower lobe squeaks.   Plan: 1.  Labs today:  CBC with diff, CMP, Mg. 2.  Discuss plan for Taxotere and +/- ramucirumab.  Side effects of Taxotere were reviewed in detail.  Potential side effects discussed included myelosuppression, hair loss, allergic reactions, third space fluid, nail changes, tearing, and neuropathy. Discussed need for Neulasta. Discussed the use of Claritin.  Discussed ramucirumab, a monoclonal antibody that targets the vascular endothelial growth factor (VEGF) receptor 2. When used with Taxotere in the second-line setting, it is the only agent that has shown improvement in overall survival. Discussed potential side effects including hypertension, bleeding, arterial thrombosis, and intestinal perforation.    Discussed the REVEL trial.  Approximately 1250 patients with NSCLC who had progressed on an initial platinum-based therapy received either Taxotere 75 mg/m2 IV +/- ramucirumab 10 mg/kg every 21 days.  Overall survival was increased in patients who received ramucirumab (10.5 months versus 9.1 months).  Progression free survival increased (4.5 months versus 3 months).  Grade 3 or 4 toxicity was increased with the most significant difference in bleeding (26% versus 13% ), predominantly grade 1-2.  After an extensive discussion, patient did not feel the risk of Taxotere + ramucirumab outweighed the benefit.  She wished to proceed with Taxotere alone.  She stated that she would consider further after cycle #1.  We discussed referral to Dr. Aniceto Boss at [facility] to discuss potential future clinical trial enrollment.  Multiple questions were asked and answered.  3.  Cycle #1 Taxotere today with OnPro Neulasta. 4.  Assist with paperwork for work. 5.  RTC  on 06/01/2016 for MD assessment and labs (CBC with diff, BMP). 6.  RTC in 3 weeks for MD assessment, labs (CBC with diff, CMP, Mg) and cycle #2 Taxotere +/- Cyramza.   Lequita Asal, MD  05/25/2016, 9:08 AM

## 2016-05-25 NOTE — Progress Notes (Signed)
Patient receiving Taxotere and Neulasta today, no Cyramza, per Dr. Mike Gip. LJ

## 2016-05-26 ENCOUNTER — Encounter: Payer: Self-pay | Admitting: Hematology and Oncology

## 2016-05-26 DIAGNOSIS — Z7189 Other specified counseling: Secondary | ICD-10-CM | POA: Insufficient documentation

## 2016-05-30 ENCOUNTER — Encounter: Payer: Self-pay | Admitting: Hematology and Oncology

## 2016-06-01 ENCOUNTER — Inpatient Hospital Stay (HOSPITAL_BASED_OUTPATIENT_CLINIC_OR_DEPARTMENT_OTHER): Payer: BLUE CROSS/BLUE SHIELD | Admitting: Hematology and Oncology

## 2016-06-01 ENCOUNTER — Other Ambulatory Visit: Payer: Self-pay

## 2016-06-01 ENCOUNTER — Inpatient Hospital Stay: Payer: BLUE CROSS/BLUE SHIELD | Attending: Hematology and Oncology

## 2016-06-01 ENCOUNTER — Encounter: Payer: Self-pay | Admitting: Hematology and Oncology

## 2016-06-01 VITALS — BP 133/79 | HR 98 | Temp 97.8°F | Resp 18 | Wt 142.2 lb

## 2016-06-01 DIAGNOSIS — C3431 Malignant neoplasm of lower lobe, right bronchus or lung: Secondary | ICD-10-CM

## 2016-06-01 DIAGNOSIS — E7800 Pure hypercholesterolemia, unspecified: Secondary | ICD-10-CM | POA: Diagnosis not present

## 2016-06-01 DIAGNOSIS — Z85828 Personal history of other malignant neoplasm of skin: Secondary | ICD-10-CM | POA: Insufficient documentation

## 2016-06-01 DIAGNOSIS — Z87891 Personal history of nicotine dependence: Secondary | ICD-10-CM | POA: Diagnosis not present

## 2016-06-01 DIAGNOSIS — Z9221 Personal history of antineoplastic chemotherapy: Secondary | ICD-10-CM

## 2016-06-01 DIAGNOSIS — Z79899 Other long term (current) drug therapy: Secondary | ICD-10-CM

## 2016-06-01 DIAGNOSIS — Z87442 Personal history of urinary calculi: Secondary | ICD-10-CM | POA: Diagnosis not present

## 2016-06-01 DIAGNOSIS — M2550 Pain in unspecified joint: Secondary | ICD-10-CM | POA: Diagnosis not present

## 2016-06-01 DIAGNOSIS — B370 Candidal stomatitis: Secondary | ICD-10-CM

## 2016-06-01 DIAGNOSIS — N2889 Other specified disorders of kidney and ureter: Secondary | ICD-10-CM | POA: Insufficient documentation

## 2016-06-01 DIAGNOSIS — Z7982 Long term (current) use of aspirin: Secondary | ICD-10-CM

## 2016-06-01 DIAGNOSIS — J439 Emphysema, unspecified: Secondary | ICD-10-CM

## 2016-06-01 DIAGNOSIS — B379 Candidiasis, unspecified: Secondary | ICD-10-CM | POA: Diagnosis not present

## 2016-06-01 DIAGNOSIS — I10 Essential (primary) hypertension: Secondary | ICD-10-CM | POA: Insufficient documentation

## 2016-06-01 LAB — BASIC METABOLIC PANEL
Anion gap: 9 (ref 5–15)
BUN: 16 mg/dL (ref 6–20)
CO2: 23 mmol/L (ref 22–32)
Calcium: 9.4 mg/dL (ref 8.9–10.3)
Chloride: 101 mmol/L (ref 101–111)
Creatinine, Ser: 1.01 mg/dL — ABNORMAL HIGH (ref 0.44–1.00)
GFR calc Af Amer: >60 mL/min (ref >60)
GFR calc non Af Amer: 55 mL/min — ABNORMAL LOW (ref >60)
Glucose, Bld: 123 mg/dL — ABNORMAL HIGH (ref 65–99)
Potassium: 3.8 mmol/L (ref 3.5–5.1)
Sodium: 133 mmol/L — ABNORMAL LOW (ref 135–145)

## 2016-06-01 LAB — CBC WITH DIFFERENTIAL/PLATELET
Basophils Absolute: 0.1 10*3/uL (ref 0–0.1)
Basophils Relative: 1 %
Eosinophils Absolute: 0.1 10*3/uL (ref 0–0.7)
Eosinophils Relative: 1 %
HCT: 33.4 % — ABNORMAL LOW (ref 35.0–47.0)
Hemoglobin: 11.2 g/dL — ABNORMAL LOW (ref 12.0–16.0)
Lymphocytes Relative: 10 %
Lymphs Abs: 1.8 10*3/uL (ref 1.0–3.6)
MCH: 34.2 pg — ABNORMAL HIGH (ref 26.0–34.0)
MCHC: 33.6 g/dL (ref 32.0–36.0)
MCV: 101.6 fL — ABNORMAL HIGH (ref 80.0–100.0)
Monocytes Absolute: 0.9 10*3/uL (ref 0.2–0.9)
Monocytes Relative: 5 %
Neutro Abs: 15.9 10*3/uL — ABNORMAL HIGH (ref 1.4–6.5)
Neutrophils Relative %: 83 %
Platelets: 117 10*3/uL — ABNORMAL LOW (ref 150–440)
RBC: 3.29 MIL/uL — ABNORMAL LOW (ref 3.80–5.20)
RDW: 15.8 % — ABNORMAL HIGH (ref 11.5–14.5)
WBC: 18.9 10*3/uL — ABNORMAL HIGH (ref 3.6–11.0)

## 2016-06-01 LAB — MAGNESIUM: Magnesium: 1.8 mg/dL (ref 1.7–2.4)

## 2016-06-01 MED ORDER — NYSTATIN 100000 UNIT/ML MT SUSP: 5.0000 mL | Freq: Four times a day (QID) | OROMUCOSAL | 0 refills | Status: DC

## 2016-06-01 NOTE — Progress Notes (Signed)
Patient states she is having fatigue and joint pain.  Not sleeping as well.  Up to BR every couple of hours.  Appetite good.

## 2016-06-01 NOTE — Progress Notes (Signed)
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  Tumor was microsatellite stable.  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  There were no alterations in EGFR, ALK, BRAF, MET, ERBB2, and ROS1.  PET scan on 11/21/2015 revealed a 2.9 x 3 cm central right lower lobe lung lesion (SUV 7.0).  Areas of surrounding more patchy right lower lobe opacity were also suspicious for metastasis or synchronous primaries. There were bilateral pulmonary nodules, including hypermetabolic right upper and right lower lobe pulmonary nodules suspicious for metastatic disease.  There was no thoracic nodal hypermetabolism.  There was an area of soft tissue fullness within the high left neck suspicious for an enlarged lateral  retropharyngeal node (? skip nodal metastasis).  There was indeterminate right renal lesion.  Clinical stage is T3N0M1.  Head MRI on 12/31/2015 revealed atrophy and small vessel disease.  There were no acute intracranial findings or intracranial metastatic disease.  There was suspected metastatic disease to a 1.2 x 2.3 cmleft lateral retropharyngeal node which displaced the left ICA medially.  Neck, chest, abdomen, and pelvic CT scan on 02/09/2016 revealed a 1.3 x 2.6 cm lateral retropharyngeal lymph node (stable), increase in size of nodules in the lingula and LLL and persistent nodular consolidation in the RLL.  Mediastinal adenopathy was stable.  Pretreatment labs included a TSH 0.278 (low), free T4 1.0 (normal), and ACTH < 1.1 (low).  ACTH was inadvertantly drawn the morning after receiving Decadron.  Follow-up early am cortisol on 02/07/2016 was 20.3 (6.7-22.6).    She received 4 cycles of carboplatin, Alimta, and Keytruda (02/03/2016 - 04/06/2016).   PET scan on 04/24/2016 revealed new and enlarging bilateral pulmonary nodules compared to the prior exam, hypermetabolic, compatible with metastatic disease in the lungs.  There was low-grade metabolic activity in mediastinal lymph nodes. There continues to be a nodular structure favoring a neck lymph node in the upper left station IIa region, mildly hypermetabolic, likely reflecting a metastatic lymph node.  There was no evidence of  She is day 8 s/p cycle #1 Taxotere (05/25/2016).  She tolerated her chemotherapy well.  Symptomatically, she is doing well.  Exam reveals mild thrush.  WBC is elevated secondary to Neulasta.  Platelet count is 117,000.  Plan: 1.  Labs today:  CBC with diff, BMP, Mg. 2.  Rx:  Nystatin swish and spit 3.  RTC on 05/07 for CBC with diff 4.  RTC on 05/17 (late appt) for MD assessment and labs (CBC with diff, CMP, Mg) 5.  RTC on 05/18 for cycle #2 Taxotere (already scheduled).   Lequita Asal, MD  06/01/2016,  10:53 AM

## 2016-06-04 ENCOUNTER — Inpatient Hospital Stay: Payer: BLUE CROSS/BLUE SHIELD

## 2016-06-04 DIAGNOSIS — E876 Hypokalemia: Secondary | ICD-10-CM

## 2016-06-04 DIAGNOSIS — C3431 Malignant neoplasm of lower lobe, right bronchus or lung: Secondary | ICD-10-CM | POA: Diagnosis not present

## 2016-06-04 DIAGNOSIS — B370 Candidal stomatitis: Secondary | ICD-10-CM

## 2016-06-04 LAB — CBC WITH DIFFERENTIAL/PLATELET
Basophils Absolute: 0.1 10*3/uL (ref 0–0.1)
Basophils Relative: 1 %
Eosinophils Absolute: 0 10*3/uL (ref 0–0.7)
Eosinophils Relative: 0 %
HCT: 32.8 % — ABNORMAL LOW (ref 35.0–47.0)
Hemoglobin: 11.1 g/dL — ABNORMAL LOW (ref 12.0–16.0)
Lymphocytes Relative: 5 %
Lymphs Abs: 0.9 10*3/uL — ABNORMAL LOW (ref 1.0–3.6)
MCH: 34.7 pg — ABNORMAL HIGH (ref 26.0–34.0)
MCHC: 33.9 g/dL (ref 32.0–36.0)
MCV: 102.4 fL — ABNORMAL HIGH (ref 80.0–100.0)
Monocytes Absolute: 0.4 10*3/uL (ref 0.2–0.9)
Monocytes Relative: 3 %
Neutro Abs: 15.5 10*3/uL — ABNORMAL HIGH (ref 1.4–6.5)
Neutrophils Relative %: 91 %
Platelets: 151 10*3/uL (ref 150–440)
RBC: 3.2 MIL/uL — ABNORMAL LOW (ref 3.80–5.20)
RDW: 16.2 % — ABNORMAL HIGH (ref 11.5–14.5)
WBC: 16.9 10*3/uL — ABNORMAL HIGH (ref 3.6–11.0)

## 2016-06-04 LAB — POTASSIUM: Potassium: 3.6 mmol/L (ref 3.5–5.1)

## 2016-06-04 LAB — MAGNESIUM: Magnesium: 1.8 mg/dL (ref 1.7–2.4)

## 2016-06-05 ENCOUNTER — Other Ambulatory Visit: Payer: Self-pay | Admitting: Hematology and Oncology

## 2016-06-05 DIAGNOSIS — C3431 Malignant neoplasm of lower lobe, right bronchus or lung: Secondary | ICD-10-CM

## 2016-06-05 DIAGNOSIS — E876 Hypokalemia: Secondary | ICD-10-CM

## 2016-06-05 DIAGNOSIS — R7989 Other specified abnormal findings of blood chemistry: Secondary | ICD-10-CM

## 2016-06-05 DIAGNOSIS — Z5111 Encounter for antineoplastic chemotherapy: Secondary | ICD-10-CM

## 2016-06-14 ENCOUNTER — Inpatient Hospital Stay (HOSPITAL_BASED_OUTPATIENT_CLINIC_OR_DEPARTMENT_OTHER): Payer: BLUE CROSS/BLUE SHIELD | Admitting: Hematology and Oncology

## 2016-06-14 ENCOUNTER — Other Ambulatory Visit: Payer: Self-pay | Admitting: Hematology and Oncology

## 2016-06-14 ENCOUNTER — Inpatient Hospital Stay: Payer: BLUE CROSS/BLUE SHIELD

## 2016-06-14 VITALS — BP 134/77 | HR 103 | Temp 98.8°F | Resp 18 | Wt 144.2 lb

## 2016-06-14 DIAGNOSIS — Z85828 Personal history of other malignant neoplasm of skin: Secondary | ICD-10-CM | POA: Diagnosis not present

## 2016-06-14 DIAGNOSIS — R7989 Other specified abnormal findings of blood chemistry: Secondary | ICD-10-CM

## 2016-06-14 DIAGNOSIS — Z79899 Other long term (current) drug therapy: Secondary | ICD-10-CM

## 2016-06-14 DIAGNOSIS — J439 Emphysema, unspecified: Secondary | ICD-10-CM

## 2016-06-14 DIAGNOSIS — Z9221 Personal history of antineoplastic chemotherapy: Secondary | ICD-10-CM | POA: Diagnosis not present

## 2016-06-14 DIAGNOSIS — E876 Hypokalemia: Secondary | ICD-10-CM

## 2016-06-14 DIAGNOSIS — M2550 Pain in unspecified joint: Secondary | ICD-10-CM

## 2016-06-14 DIAGNOSIS — C3431 Malignant neoplasm of lower lobe, right bronchus or lung: Secondary | ICD-10-CM

## 2016-06-14 DIAGNOSIS — Z87891 Personal history of nicotine dependence: Secondary | ICD-10-CM | POA: Diagnosis not present

## 2016-06-14 DIAGNOSIS — B370 Candidal stomatitis: Secondary | ICD-10-CM

## 2016-06-14 DIAGNOSIS — B379 Candidiasis, unspecified: Secondary | ICD-10-CM | POA: Diagnosis not present

## 2016-06-14 DIAGNOSIS — E7800 Pure hypercholesterolemia, unspecified: Secondary | ICD-10-CM

## 2016-06-14 DIAGNOSIS — N2889 Other specified disorders of kidney and ureter: Secondary | ICD-10-CM

## 2016-06-14 DIAGNOSIS — C3491 Malignant neoplasm of unspecified part of right bronchus or lung: Secondary | ICD-10-CM | POA: Diagnosis not present

## 2016-06-14 DIAGNOSIS — Z87442 Personal history of urinary calculi: Secondary | ICD-10-CM

## 2016-06-14 DIAGNOSIS — Z5111 Encounter for antineoplastic chemotherapy: Secondary | ICD-10-CM

## 2016-06-14 DIAGNOSIS — Z7982 Long term (current) use of aspirin: Secondary | ICD-10-CM | POA: Diagnosis not present

## 2016-06-14 DIAGNOSIS — I10 Essential (primary) hypertension: Secondary | ICD-10-CM

## 2016-06-14 LAB — COMPREHENSIVE METABOLIC PANEL
ALT: 13 U/L — ABNORMAL LOW (ref 14–54)
AST: 27 U/L (ref 15–41)
Albumin: 3.8 g/dL (ref 3.5–5.0)
Alkaline Phosphatase: 103 U/L (ref 38–126)
Anion gap: 9 (ref 5–15)
BUN: 16 mg/dL (ref 6–20)
CO2: 23 mmol/L (ref 22–32)
Calcium: 9.5 mg/dL (ref 8.9–10.3)
Chloride: 104 mmol/L (ref 101–111)
Creatinine, Ser: 1.19 mg/dL — ABNORMAL HIGH (ref 0.44–1.00)
GFR calc Af Amer: 52 mL/min — ABNORMAL LOW (ref >60)
GFR calc non Af Amer: 45 mL/min — ABNORMAL LOW (ref >60)
Glucose, Bld: 270 mg/dL — ABNORMAL HIGH (ref 65–99)
Potassium: 4.3 mmol/L (ref 3.5–5.1)
Sodium: 136 mmol/L (ref 135–145)
Total Bilirubin: 0.4 mg/dL (ref 0.3–1.2)
Total Protein: 7.5 g/dL (ref 6.5–8.1)

## 2016-06-14 LAB — CBC WITH DIFFERENTIAL/PLATELET
Basophils Absolute: 0 10*3/uL (ref 0–0.1)
Basophils Relative: 0 %
Eosinophils Absolute: 0 10*3/uL (ref 0–0.7)
Eosinophils Relative: 0 %
HCT: 34.5 % — ABNORMAL LOW (ref 35.0–47.0)
Hemoglobin: 11.7 g/dL — ABNORMAL LOW (ref 12.0–16.0)
Lymphocytes Relative: 4 %
Lymphs Abs: 0.4 10*3/uL — ABNORMAL LOW (ref 1.0–3.6)
MCH: 34.3 pg — ABNORMAL HIGH (ref 26.0–34.0)
MCHC: 33.9 g/dL (ref 32.0–36.0)
MCV: 101 fL — ABNORMAL HIGH (ref 80.0–100.0)
Monocytes Absolute: 0 10*3/uL — ABNORMAL LOW (ref 0.2–0.9)
Monocytes Relative: 0 %
Neutro Abs: 9.4 10*3/uL — ABNORMAL HIGH (ref 1.4–6.5)
Neutrophils Relative %: 96 %
Platelets: 388 10*3/uL (ref 150–440)
RBC: 3.42 MIL/uL — ABNORMAL LOW (ref 3.80–5.20)
RDW: 15.6 % — ABNORMAL HIGH (ref 11.5–14.5)
WBC: 9.8 10*3/uL (ref 3.6–11.0)

## 2016-06-14 LAB — MAGNESIUM: Magnesium: 1.9 mg/dL (ref 1.7–2.4)

## 2016-06-14 NOTE — Progress Notes (Signed)
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 was unable to be passed in the RML.  Transbronchial needle aspirations of a lesion were performed in the medial segment of the right middle lobe.  Pathology revealed atypical cells.  CEA and LDH were normal on 11/23/2015.  She underwent video assisted thoracoscopy (VATS) on 12/14/2015.  On the undersurface of the right middle lobe there wasa palpable mass.  Pathology from the right middle lobe wedge resection revealed adenocarcinoma, lepidic (80%), papillary (10%), and solid (10%).  Pleural biopsy was negative for malignancy.  EGFR, ALK, ROS1, BRAF were negative.  PDL-1 was 5%.  [employer] One testing on 05/16/2016 revealed the following genomic alterations:  RET E511K, KRAS G12C, MDM2 amplification, FRS2 amplification, GATA6  amplification.  Tumor was microsatellite stable.  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  There were no alterations in EGFR, ALK, BRAF, MET, ERBB2, and ROS1.  PET scan on 11/21/2015 revealed a 2.9 x 3 cm central right lower lobe lung lesion (SUV 7.0).  Areas of surrounding more patchy right lower lobe opacity were also suspicious for metastasis or synchronous primaries. There were bilateral pulmonary nodules, including hypermetabolic right upper and right lower lobe pulmonary nodules suspicious for metastatic disease.  There was no thoracic nodal hypermetabolism.  There was an area of soft tissue fullness within the high left neck suspicious for an enlarged lateral retropharyngeal node (? skip nodal metastasis).  There was indeterminate right renal lesion.  Clinical stage is T3N0M1.  Head MRI on 12/31/2015 revealed atrophy and small vessel disease.  There were no acute intracranial findings or intracranial metastatic disease.  There was suspected metastatic disease to a 1.2 x 2.3 cmleft lateral retropharyngeal node which displaced the left ICA medially.  Neck, chest, abdomen, and pelvic CT scan on 02/09/2016 revealed a 1.3 x 2.6 cm lateral retropharyngeal lymph node (stable), increase in size of nodules in the lingula and LLL and persistent nodular consolidation in the RLL.  Mediastinal adenopathy was stable.  Pretreatment labs included a TSH 0.278 (low), free T4 1.0 (normal), and ACTH < 1.1 (low).  ACTH was inadvertantly drawn the morning after receiving Decadron.  Follow-up early am cortisol on 02/07/2016 was 20.3 (6.7-22.6).    She received 4 cycles of carboplatin, Alimta, and Keytruda (02/03/2016 - 04/06/2016).   PET scan on 04/24/2016 revealed new and enlarging bilateral pulmonary nodules compared to the prior exam, hypermetabolic, compatible with metastatic disease in the lungs.  There was low-grade metabolic activity in mediastinal lymph nodes. There continues to be a nodular  structure favoring a neck lymph node in the upper left station IIa region, mildly hypermetabolic, likely reflecting a metastatic lymph node.  There was no evidence of  She is day 21 s/p cycle #1 Taxotere (05/25/2016).  She tolerated her chemotherapy well.  Counts were good.  Symptomatically, she is doing well.  Exam is unremarkable.  Plan: 1.  Labs today:  CBC with diff, BMP, Mg. 2.  Follow-up with Dr. Aniceto Boss regarding future clinical trials. 3.  RTC tomorrow for cycle #2 Taxotere and OnPro Neulasta.  4.  RTC on 07/06/2016 for MD assessment, labs (CBC with diff, CMP, Mg) and cycle #3 Taxotere.   Lequita Asal, MD  06/14/2016, 3:52 PM

## 2016-06-14 NOTE — Progress Notes (Signed)
Patient here today for follow up.  Patient states no new concerns today  

## 2016-06-14 NOTE — Telephone Encounter (Signed)
   Ref Range & Units 15:04  Potassium 3.5 - 5.1 mmol/L 4.3

## 2016-06-15 ENCOUNTER — Inpatient Hospital Stay: Payer: BLUE CROSS/BLUE SHIELD

## 2016-06-15 ENCOUNTER — Other Ambulatory Visit: Payer: BLUE CROSS/BLUE SHIELD

## 2016-06-15 ENCOUNTER — Ambulatory Visit: Payer: BLUE CROSS/BLUE SHIELD | Admitting: Oncology

## 2016-06-15 ENCOUNTER — Encounter: Payer: Self-pay | Admitting: Hematology and Oncology

## 2016-06-15 VITALS — BP 121/74 | HR 94 | Temp 97.7°F | Resp 18

## 2016-06-15 DIAGNOSIS — C3431 Malignant neoplasm of lower lobe, right bronchus or lung: Secondary | ICD-10-CM | POA: Diagnosis not present

## 2016-06-15 MED ORDER — ACETAMINOPHEN 325 MG PO TABS
650.0000 mg | ORAL_TABLET | Freq: Once | ORAL | Status: AC
  Administered 2016-06-15: 650 mg via ORAL
  Filled 2016-06-15: qty 2

## 2016-06-15 MED ORDER — SODIUM CHLORIDE 0.9% FLUSH
10.0000 mL | INTRAVENOUS | Status: DC | PRN
  Administered 2016-06-15: 10 mL
  Filled 2016-06-15: qty 10

## 2016-06-15 MED ORDER — SODIUM CHLORIDE 0.9 % IV SOLN: 10.0000 mg | Freq: Once | INTRAVENOUS | Status: DC

## 2016-06-15 MED ORDER — DEXAMETHASONE SODIUM PHOSPHATE 10 MG/ML IJ SOLN
10.0000 mg | Freq: Once | INTRAMUSCULAR | Status: AC
  Administered 2016-06-15: 10 mg via INTRAVENOUS
  Filled 2016-06-15: qty 1

## 2016-06-15 MED ORDER — PEGFILGRASTIM 6 MG/0.6ML ~~LOC~~ PSKT
6.0000 mg | PREFILLED_SYRINGE | Freq: Once | SUBCUTANEOUS | Status: AC
  Administered 2016-06-15: 6 mg via SUBCUTANEOUS
  Filled 2016-06-15: qty 0.6

## 2016-06-15 MED ORDER — DIPHENHYDRAMINE HCL 50 MG/ML IJ SOLN
50.0000 mg | Freq: Once | INTRAMUSCULAR | Status: AC
  Administered 2016-06-15: 50 mg via INTRAVENOUS
  Filled 2016-06-15: qty 1

## 2016-06-15 MED ORDER — SODIUM CHLORIDE 0.9 % IV SOLN
Freq: Once | INTRAVENOUS | Status: AC
  Administered 2016-06-15: 11:00:00 via INTRAVENOUS
  Filled 2016-06-15: qty 1000

## 2016-06-15 MED ORDER — HEPARIN SOD (PORK) LOCK FLUSH 100 UNIT/ML IV SOLN
500.0000 [IU] | Freq: Once | INTRAVENOUS | Status: AC | PRN
  Administered 2016-06-15: 500 [IU]
  Filled 2016-06-15: qty 5

## 2016-06-15 MED ORDER — DOCETAXEL CHEMO INJECTION 160 MG/16ML
75.0000 mg/m2 | Freq: Once | INTRAVENOUS | Status: AC
  Administered 2016-06-15: 130 mg via INTRAVENOUS
  Filled 2016-06-15: qty 13

## 2016-06-19 ENCOUNTER — Other Ambulatory Visit: Payer: Self-pay | Admitting: *Deleted

## 2016-06-19 DIAGNOSIS — Z902 Acquired absence of lung [part of]: Secondary | ICD-10-CM | POA: Diagnosis not present

## 2016-06-19 DIAGNOSIS — C342 Malignant neoplasm of middle lobe, bronchus or lung: Secondary | ICD-10-CM | POA: Diagnosis not present

## 2016-06-19 DIAGNOSIS — C3491 Malignant neoplasm of unspecified part of right bronchus or lung: Secondary | ICD-10-CM | POA: Diagnosis not present

## 2016-06-19 MED ORDER — NYSTATIN 100000 UNIT/ML MT SUSP: 5.0000 mL | Freq: Four times a day (QID) | OROMUCOSAL | 1 refills | Status: DC

## 2016-07-05 ENCOUNTER — Other Ambulatory Visit: Payer: Self-pay | Admitting: Hematology and Oncology

## 2016-07-06 ENCOUNTER — Inpatient Hospital Stay (HOSPITAL_BASED_OUTPATIENT_CLINIC_OR_DEPARTMENT_OTHER): Payer: BLUE CROSS/BLUE SHIELD | Admitting: Oncology

## 2016-07-06 ENCOUNTER — Inpatient Hospital Stay: Payer: BLUE CROSS/BLUE SHIELD | Attending: Oncology

## 2016-07-06 ENCOUNTER — Inpatient Hospital Stay: Payer: BLUE CROSS/BLUE SHIELD

## 2016-07-06 ENCOUNTER — Other Ambulatory Visit: Payer: Self-pay | Admitting: Hematology and Oncology

## 2016-07-06 VITALS — BP 119/70 | HR 91 | Temp 96.6°F | Wt 142.1 lb

## 2016-07-06 DIAGNOSIS — C3431 Malignant neoplasm of lower lobe, right bronchus or lung: Secondary | ICD-10-CM

## 2016-07-06 DIAGNOSIS — I739 Peripheral vascular disease, unspecified: Secondary | ICD-10-CM | POA: Insufficient documentation

## 2016-07-06 DIAGNOSIS — I10 Essential (primary) hypertension: Secondary | ICD-10-CM | POA: Insufficient documentation

## 2016-07-06 DIAGNOSIS — Z85828 Personal history of other malignant neoplasm of skin: Secondary | ICD-10-CM | POA: Diagnosis not present

## 2016-07-06 DIAGNOSIS — J449 Chronic obstructive pulmonary disease, unspecified: Secondary | ICD-10-CM

## 2016-07-06 DIAGNOSIS — Z79899 Other long term (current) drug therapy: Secondary | ICD-10-CM

## 2016-07-06 DIAGNOSIS — Z7689 Persons encountering health services in other specified circumstances: Secondary | ICD-10-CM | POA: Diagnosis not present

## 2016-07-06 DIAGNOSIS — Z87891 Personal history of nicotine dependence: Secondary | ICD-10-CM | POA: Diagnosis not present

## 2016-07-06 DIAGNOSIS — Z5111 Encounter for antineoplastic chemotherapy: Secondary | ICD-10-CM

## 2016-07-06 DIAGNOSIS — E7800 Pure hypercholesterolemia, unspecified: Secondary | ICD-10-CM | POA: Insufficient documentation

## 2016-07-06 DIAGNOSIS — R590 Localized enlarged lymph nodes: Secondary | ICD-10-CM

## 2016-07-06 LAB — COMPREHENSIVE METABOLIC PANEL
ALT: 10 U/L — ABNORMAL LOW (ref 14–54)
AST: 22 U/L (ref 15–41)
Albumin: 3.1 g/dL — ABNORMAL LOW (ref 3.5–5.0)
Alkaline Phosphatase: 86 U/L (ref 38–126)
Anion gap: 8 (ref 5–15)
BUN: 20 mg/dL (ref 6–20)
CO2: 23 mmol/L (ref 22–32)
Calcium: 9.4 mg/dL (ref 8.9–10.3)
Chloride: 104 mmol/L (ref 101–111)
Creatinine, Ser: 1.1 mg/dL — ABNORMAL HIGH (ref 0.44–1.00)
GFR calc Af Amer: 57 mL/min — ABNORMAL LOW (ref >60)
GFR calc non Af Amer: 49 mL/min — ABNORMAL LOW (ref >60)
Glucose, Bld: 142 mg/dL — ABNORMAL HIGH (ref 65–99)
Potassium: 3.8 mmol/L (ref 3.5–5.1)
Sodium: 135 mmol/L (ref 135–145)
Total Bilirubin: 0.3 mg/dL (ref 0.3–1.2)
Total Protein: 6.7 g/dL (ref 6.5–8.1)

## 2016-07-06 LAB — CBC WITH DIFFERENTIAL/PLATELET
Basophils Absolute: 0 10*3/uL (ref 0–0.1)
Basophils Relative: 0 %
Eosinophils Absolute: 0 10*3/uL (ref 0–0.7)
Eosinophils Relative: 0 %
HCT: 30.3 % — ABNORMAL LOW (ref 35.0–47.0)
Hemoglobin: 10.2 g/dL — ABNORMAL LOW (ref 12.0–16.0)
Lymphocytes Relative: 5 %
Lymphs Abs: 0.8 10*3/uL — ABNORMAL LOW (ref 1.0–3.6)
MCH: 32.2 pg (ref 26.0–34.0)
MCHC: 33.8 g/dL (ref 32.0–36.0)
MCV: 95.3 fL (ref 80.0–100.0)
Monocytes Absolute: 0.6 10*3/uL (ref 0.2–0.9)
Monocytes Relative: 4 %
Neutro Abs: 15.1 10*3/uL — ABNORMAL HIGH (ref 1.4–6.5)
Neutrophils Relative %: 91 %
Platelets: 403 10*3/uL (ref 150–440)
RBC: 3.18 MIL/uL — ABNORMAL LOW (ref 3.80–5.20)
RDW: 15.5 % — ABNORMAL HIGH (ref 11.5–14.5)
WBC: 16.6 10*3/uL — ABNORMAL HIGH (ref 3.6–11.0)

## 2016-07-06 LAB — MAGNESIUM: Magnesium: 2.1 mg/dL (ref 1.7–2.4)

## 2016-07-06 MED ORDER — DEXAMETHASONE SODIUM PHOSPHATE 100 MG/10ML IJ SOLN: 10.0000 mg | Freq: Once | INTRAMUSCULAR | Status: DC

## 2016-07-06 MED ORDER — DOCETAXEL CHEMO INJECTION 160 MG/16ML
75.0000 mg/m2 | Freq: Once | INTRAVENOUS | Status: AC
  Administered 2016-07-06: 130 mg via INTRAVENOUS
  Filled 2016-07-06: qty 13

## 2016-07-06 MED ORDER — SODIUM CHLORIDE 0.9% FLUSH
10.0000 mL | INTRAVENOUS | Status: DC | PRN
  Administered 2016-07-06: 10 mL
  Filled 2016-07-06: qty 10

## 2016-07-06 MED ORDER — HEPARIN SOD (PORK) LOCK FLUSH 100 UNIT/ML IV SOLN
500.0000 [IU] | Freq: Once | INTRAVENOUS | Status: AC | PRN
  Administered 2016-07-06: 500 [IU]
  Filled 2016-07-06: qty 5

## 2016-07-06 MED ORDER — SODIUM CHLORIDE 0.9 % IV SOLN
Freq: Once | INTRAVENOUS | Status: AC
  Administered 2016-07-06: 10:00:00 via INTRAVENOUS
  Filled 2016-07-06: qty 1000

## 2016-07-06 MED ORDER — DIPHENHYDRAMINE HCL 50 MG/ML IJ SOLN
50.0000 mg | Freq: Once | INTRAMUSCULAR | Status: AC
Start: 2016-07-06 — End: 2016-07-06
  Administered 2016-07-06: 50 mg via INTRAVENOUS
  Filled 2016-07-06: qty 1

## 2016-07-06 MED ORDER — DOCETAXEL CHEMO INJECTION 160 MG/16ML: 75.0000 mg/m2 | Freq: Once | INTRAVENOUS | Status: DC

## 2016-07-06 MED ORDER — PEGFILGRASTIM 6 MG/0.6ML ~~LOC~~ PSKT
6.0000 mg | PREFILLED_SYRINGE | Freq: Once | SUBCUTANEOUS | Status: AC
  Administered 2016-07-06: 6 mg via SUBCUTANEOUS
  Filled 2016-07-06: qty 0.6

## 2016-07-06 MED ORDER — ACETAMINOPHEN 325 MG PO TABS
650.0000 mg | ORAL_TABLET | Freq: Once | ORAL | Status: AC
  Administered 2016-07-06: 650 mg via ORAL
  Filled 2016-07-06: qty 2

## 2016-07-06 MED ORDER — DEXAMETHASONE SODIUM PHOSPHATE 10 MG/ML IJ SOLN
10.0000 mg | Freq: Once | INTRAMUSCULAR | Status: AC
  Administered 2016-07-06: 10 mg via INTRAVENOUS
  Filled 2016-07-06: qty 1

## 2016-07-06 NOTE — Progress Notes (Signed)
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  Bronchoscopy on 09/22/2015 revealed mild to moderate chronic bronchitis changes with no endobronchial lesions.  Brushings from the right lower lobe were negative for malignancy.  Repeat bronchoscopy on 11/22/2015 was performed.  An EBUS scope was unable to be passed in the RML.  Transbronchial needle aspirations of a lesion were performed in the medial segment of the right middle lobe.  Pathology revealed atypical cells.  CEA and LDH were normal on 11/23/2015.  She underwent video assisted thoracoscopy (VATS) on 12/14/2015.  On the undersurface of the right middle lobe there wasa palpable mass.  Pathology from the right middle lobe wedge resection revealed adenocarcinoma, lepidic (80%), papillary (10%), and solid (10%).  Pleural biopsy was negative for malignancy.  EGFR, ALK, ROS1, BRAF were negative.  PDL-1 was 5%.  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  Tumor was microsatellite stable.  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  There were no alterations in EGFR, ALK, BRAF, MET, ERBB2, and ROS1.  PET scan on 11/21/2015  revealed a 2.9 x 3 cm central right lower lobe lung lesion (SUV 7.0).  Areas of surrounding more patchy right lower lobe opacity were also suspicious for metastasis or synchronous primaries. There were bilateral pulmonary nodules, including hypermetabolic right upper and right lower lobe pulmonary nodules suspicious for metastatic disease.  There was no thoracic nodal hypermetabolism.  There was an area of soft tissue fullness within the high left neck suspicious for an enlarged lateral retropharyngeal node (? skip nodal metastasis).  There was indeterminate right renal lesion.  Clinical stage is T3N0M1.  Head MRI on 12/31/2015 revealed atrophy and small vessel disease.  There were no acute intracranial findings or intracranial metastatic disease.  There was suspected metastatic disease to a 1.2 x 2.3 cmleft lateral retropharyngeal node which displaced the left ICA medially.  Neck, chest, abdomen, and pelvic CT scan on 02/09/2016 revealed a 1.3 x 2.6 cm lateral retropharyngeal lymph node (stable), increase in size of nodules in the lingula and LLL and persistent nodular consolidation in the RLL.  Mediastinal adenopathy was stable.  Pretreatment labs included a TSH 0.278 (low), free T4 1.0 (normal), and ACTH < 1.1 (low).  ACTH was inadvertantly drawn the morning after receiving Decadron.  Follow-up early am cortisol on 02/07/2016 was 20.3 (6.7-22.6).    She received 4 cycles of carboplatin, Alimta, and Keytruda (02/03/2016 - 04/06/2016).   PET scan on 04/24/2016 revealed new and enlarging bilateral pulmonary nodules compared to the prior exam, hypermetabolic, compatible with metastatic disease in the lungs. There was low-grade metabolic activity in mediastinal lymph nodes. There continues to be a nodular structure favoring a neck lymph node in the upper left station IIa region, mildly hypermetabolic, likely reflecting a metastatic lymph node.  Interval history- she is tolerating her chemo well.  Reports no neuropathy or diarrhea. Continues to work  ECOG PS- 1 Pain scale- 0 Opioid associated constipation- no  Review of systems- Review of Systems  Constitutional: Negative for chills, fever, malaise/fatigue and weight loss.  HENT: Negative for congestion, ear discharge and nosebleeds.   Eyes: Negative for blurred vision.  Respiratory: Negative for cough, hemoptysis, sputum production, shortness of breath and wheezing.   Cardiovascular: Negative for chest pain, palpitations, orthopnea and claudication.  Gastrointestinal: Negative for abdominal pain, blood in stool, constipation, diarrhea, heartburn, melena, nausea and vomiting.  Genitourinary: Negative for dysuria, flank pain, frequency, hematuria and urgency.  Musculoskeletal: Negative for back pain, joint pain and myalgias.  Skin: Negative for rash.  Neurological: Negative for dizziness, tingling, focal weakness, seizures, weakness and headaches.  Endo/Heme/Allergies: Does not bruise/bleed easily.  Psychiatric/Behavioral: Negative for depression and suicidal ideas. The patient does not have insomnia.        Allergies  Allergen Reactions  . Augmentin [Amoxicillin-Pot Clavulanate] Hives  . Penicillins Hives    Has patient had a PCN reaction causing immediate rash, facial/tongue/throat swelling, SOB or lightheadedness with hypotension: No Has patient had a PCN reaction causing severe rash involving mucus membranes or skin necrosis: No Has patient had a PCN reaction that required hospitalization No Has patient had a PCN reaction occurring within the last 10 years: Yes If all of the above answers are "NO", then may proceed with Cephalosporin use.       Past Medical History:  Diagnosis Date  . Cancer ([place])    basal cell   . Emphysema of lung ([place])   . Hypercholesteremia   . Hypertension   . Lung mass   . Renal disorder    renal insufficiency     Past Surgical History:  Procedure Laterality Date  . BASAL CELL  CARCINOMA EXCISION    . COLONOSCOPY    . ENDOBRONCHIAL ULTRASOUND N/A 11/22/2015   Procedure: ENDOBRONCHIAL ULTRASOUND;  Surgeon: Flora Lipps, MD;  Location: [facility] ORS;  Service: Cardiopulmonary;  Laterality: N/A;  . FLEXIBLE BRONCHOSCOPY N/A 09/22/2015   Procedure: FLEXIBLE BRONCHOSCOPY;  Surgeon: [name], MD;  Location: [facility] ORS;  Service: Pulmonary;  Laterality: N/A;  . PORTACATH PLACEMENT Right 02/01/2016   Procedure: INSERTION PORT-A-CATH;  Surgeon: Nestor Lewandowsky, MD;  Location: [facility] ORS;  Service: General;  Laterality: Right;  Right internal jugular vein  . VIDEO ASSISTED THORACOSCOPY (VATS)/THOROCOTOMY Right 12/14/2015   Procedure: VIDEO ASSISTED THORACOSCOPY (VATS)/THOROCOTOMY;  Surgeon: Nestor Lewandowsky, MD;  Location: [facility] ORS;  Service: Thoracic;  Laterality: Right;    Social History   Social History  . Marital status: Single    Spouse name: N/A  . Number of children: N/A  . Years of education: N/A   Occupational History  . Not on file.   Social History Main Topics  . Smoking status: Former Smoker    Packs/day: 0.50    Years: 45.00    Types: Cigarettes    Quit date: 11/06/2015  . Smokeless tobacco: Never Used     Comment: hasn't smoked since 11-06-15  . Alcohol use Yes     Comment: rare  . Drug use: No  . Sexual activity: Not on file   Other Topics Concern  . Not on file   Social History Narrative  . No narrative on file    Family History  Problem Relation Age of Onset  . Alzheimer's disease Mother   . Heart disease Father   .  Heart attack Brother      Current Outpatient Prescriptions:  .  amLODipine (NORVASC) 10 MG tablet, Take 10 mg by mouth daily., Disp: , Rfl:  .  dexamethasone (DECADRON) 4 MG tablet, Take 2 tablets (8 mg total) by mouth 2 (two) times daily. Start the day before Taxotere. Then daily after chemo for 2 days., Disp: 30 tablet, Rfl: 1 .  folic acid (FOLVITE) 1 MG tablet, Take 1 tablet (1 mg total) by mouth daily., Disp: 30 tablet, Rfl:  3 .  hydrALAZINE (APRESOLINE) 50 MG tablet, Take 50 mg by mouth 3 (three) times daily., Disp: , Rfl:  .  KLOR-CON 10 10 MEQ tablet, TAKE 1 TABLET (10 MEQ TOTAL) BY MOUTH 2 (TWO) TIMES DAILY., Disp: 60 tablet, Rfl: 1 .  magnesium oxide (MAG-OX) 400 MG tablet, TAKE 1 TABLET (400 MG TOTAL) BY MOUTH DAILY., Disp: 30 tablet, Rfl: 1 .  metoprolol succinate (TOPROL-XL) 100 MG 24 hr tablet, Take 100 mg by mouth daily. Take with or immediately following a meal., Disp: , Rfl:  .  nystatin (MYCOSTATIN) 100000 UNIT/ML suspension, Take 5 mLs (500,000 Units total) by mouth 4 (four) times daily., Disp: 120 mL, Rfl: 1 .  sodium bicarbonate 650 MG tablet, Take 1,300 mg by mouth 2 (two) times daily. , Disp: , Rfl:  .  traMADol (ULTRAM) 50 MG tablet, Take 1 tablet (50 mg total) by mouth every 12 (twelve) hours as needed. (Patient not taking: Reported on 06/14/2016), Disp: 30 tablet, Rfl: 0  Physical exam:  Vitals:   07/06/16 0945  BP: 119/70  Pulse: 91  Temp: (!) 96.6 F (35.9 C)  TempSrc: Tympanic  SpO2: 97%  Weight: 142 lb 1 oz (64.4 kg)   Physical Exam  Constitutional: She is oriented to person, place, and time and well-developed, well-nourished, and in no distress.  HENT:  Head: Normocephalic and atraumatic.  Eyes: EOM are normal. Pupils are equal, round, and reactive to light.  Neck: Normal range of motion.  Cardiovascular: Normal rate, regular rhythm and normal heart sounds.   Pulmonary/Chest: Effort normal and breath sounds normal.  Abdominal: Soft. Bowel sounds are normal.  Neurological: She is alert and oriented to person, place, and time.  Skin: Skin is warm and dry.     CMP Latest Ref Rng & Units 07/06/2016  Glucose 65 - 99 mg/dL 142(H)  BUN 6 - 20 mg/dL 20  Creatinine 0.44 - 1.00 mg/dL 1.10(H)  Sodium 135 - 145 mmol/L 135  Potassium 3.5 - 5.1 mmol/L 3.8  Chloride 101 - 111 mmol/L 104  CO2 22 - 32 mmol/L 23  Calcium 8.9 - 10.3 mg/dL 9.4  Total Protein 6.5 - 8.1 g/dL 6.7  Total  Bilirubin 0.3 - 1.2 mg/dL 0.3  Alkaline Phos 38 - 126 U/L 86  AST 15 - 41 U/L 22  ALT 14 - 54 U/L 10(L)   CBC Latest Ref Rng & Units 07/06/2016  WBC 3.6 - 11.0 K/uL 16.6(H)  Hemoglobin 12.0 - 16.0 g/dL 10.2(L)  Hematocrit 35.0 - 47.0 % 30.3(L)  Platelets 150 - 440 K/uL 403      Assessment and plan- Patient is a 71 y.o. female with stage 4 adenocarcinoma of the lung currently on second line taxotere after progression on carbo/alimta/keytruda  Counts ok to proceed with cycle 3 of taxotere with neulasta support. rtc in 3 week for cycle 4 of taxotere with cbc/bmp and mg. I will discuss with DR. Mike Gip about next imaging timing and treatment options  Visit Diagnosis 1. Cancer of lower lobe of right lung ([place])   2. Encounter for antineoplastic chemotherapy      Dr. Randa Evens, MD, MPH [facility] at [facility] Pager- [phone] 07/06/2016

## 2016-07-07 ENCOUNTER — Other Ambulatory Visit: Payer: Self-pay | Admitting: Oncology

## 2016-07-07 ENCOUNTER — Encounter: Payer: Self-pay | Admitting: Oncology

## 2016-07-07 DIAGNOSIS — C3431 Malignant neoplasm of lower lobe, right bronchus or lung: Secondary | ICD-10-CM

## 2016-07-21 ENCOUNTER — Other Ambulatory Visit: Payer: Self-pay | Admitting: Hematology and Oncology

## 2016-07-24 ENCOUNTER — Other Ambulatory Visit: Payer: Self-pay | Admitting: Oncology

## 2016-07-24 ENCOUNTER — Ambulatory Visit
Admission: RE | Admit: 2016-07-24 | Discharge: 2016-07-24 | Disposition: A | Discharge status: Home / Self Care | Payer: BLUE CROSS/BLUE SHIELD | Source: Ambulatory Visit | Attending: Oncology | Admitting: Oncology

## 2016-07-24 DIAGNOSIS — R590 Localized enlarged lymph nodes: Secondary | ICD-10-CM | POA: Diagnosis not present

## 2016-07-24 DIAGNOSIS — C3431 Malignant neoplasm of lower lobe, right bronchus or lung: Secondary | ICD-10-CM | POA: Diagnosis present

## 2016-07-24 DIAGNOSIS — R918 Other nonspecific abnormal finding of lung field: Secondary | ICD-10-CM | POA: Diagnosis not present

## 2016-07-27 ENCOUNTER — Inpatient Hospital Stay: Payer: BLUE CROSS/BLUE SHIELD

## 2016-07-27 ENCOUNTER — Inpatient Hospital Stay (HOSPITAL_BASED_OUTPATIENT_CLINIC_OR_DEPARTMENT_OTHER): Payer: BLUE CROSS/BLUE SHIELD | Admitting: Oncology

## 2016-07-27 ENCOUNTER — Encounter: Payer: Self-pay | Admitting: Oncology

## 2016-07-27 VITALS — BP 121/76 | HR 96 | Temp 97.0°F | Resp 18 | Wt 139.4 lb

## 2016-07-27 DIAGNOSIS — Z7689 Persons encountering health services in other specified circumstances: Secondary | ICD-10-CM

## 2016-07-27 DIAGNOSIS — C3431 Malignant neoplasm of lower lobe, right bronchus or lung: Secondary | ICD-10-CM

## 2016-07-27 DIAGNOSIS — Z5111 Encounter for antineoplastic chemotherapy: Secondary | ICD-10-CM

## 2016-07-27 DIAGNOSIS — Z79899 Other long term (current) drug therapy: Secondary | ICD-10-CM | POA: Diagnosis not present

## 2016-07-27 DIAGNOSIS — Z85828 Personal history of other malignant neoplasm of skin: Secondary | ICD-10-CM

## 2016-07-27 DIAGNOSIS — R590 Localized enlarged lymph nodes: Secondary | ICD-10-CM

## 2016-07-27 DIAGNOSIS — Z87891 Personal history of nicotine dependence: Secondary | ICD-10-CM

## 2016-07-27 DIAGNOSIS — I739 Peripheral vascular disease, unspecified: Secondary | ICD-10-CM

## 2016-07-27 DIAGNOSIS — E876 Hypokalemia: Secondary | ICD-10-CM

## 2016-07-27 DIAGNOSIS — I10 Essential (primary) hypertension: Secondary | ICD-10-CM

## 2016-07-27 DIAGNOSIS — J449 Chronic obstructive pulmonary disease, unspecified: Secondary | ICD-10-CM

## 2016-07-27 DIAGNOSIS — E7800 Pure hypercholesterolemia, unspecified: Secondary | ICD-10-CM

## 2016-07-27 LAB — COMPREHENSIVE METABOLIC PANEL
ALT: 11 U/L — ABNORMAL LOW (ref 14–54)
AST: 22 U/L (ref 15–41)
Albumin: 3.6 g/dL (ref 3.5–5.0)
Alkaline Phosphatase: 96 U/L (ref 38–126)
Anion gap: 10 (ref 5–15)
BUN: 19 mg/dL (ref 6–20)
CO2: 22 mmol/L (ref 22–32)
Calcium: 9.6 mg/dL (ref 8.9–10.3)
Chloride: 105 mmol/L (ref 101–111)
Creatinine, Ser: 1.1 mg/dL — ABNORMAL HIGH (ref 0.44–1.00)
GFR calc Af Amer: 57 mL/min — ABNORMAL LOW (ref >60)
GFR calc non Af Amer: 49 mL/min — ABNORMAL LOW (ref >60)
Glucose, Bld: 122 mg/dL — ABNORMAL HIGH (ref 65–99)
Potassium: 3.8 mmol/L (ref 3.5–5.1)
Sodium: 137 mmol/L (ref 135–145)
Total Bilirubin: 0.3 mg/dL (ref 0.3–1.2)
Total Protein: 7.1 g/dL (ref 6.5–8.1)

## 2016-07-27 LAB — CBC WITH DIFFERENTIAL/PLATELET
Basophils Absolute: 0.1 10*3/uL (ref 0–0.1)
Basophils Relative: 0 %
Eosinophils Absolute: 0 10*3/uL (ref 0–0.7)
Eosinophils Relative: 0 %
HCT: 32.6 % — ABNORMAL LOW (ref 35.0–47.0)
Hemoglobin: 10.8 g/dL — ABNORMAL LOW (ref 12.0–16.0)
Lymphocytes Relative: 5 %
Lymphs Abs: 0.9 10*3/uL — ABNORMAL LOW (ref 1.0–3.6)
MCH: 31.1 pg (ref 26.0–34.0)
MCHC: 33 g/dL (ref 32.0–36.0)
MCV: 94.4 fL (ref 80.0–100.0)
Monocytes Absolute: 0.7 10*3/uL (ref 0.2–0.9)
Monocytes Relative: 3 %
Neutro Abs: 18.3 10*3/uL — ABNORMAL HIGH (ref 1.4–6.5)
Neutrophils Relative %: 92 %
Platelets: 402 10*3/uL (ref 150–440)
RBC: 3.45 MIL/uL — ABNORMAL LOW (ref 3.80–5.20)
RDW: 17.3 % — ABNORMAL HIGH (ref 11.5–14.5)
WBC: 20 10*3/uL — ABNORMAL HIGH (ref 3.6–11.0)

## 2016-07-27 LAB — MAGNESIUM: Magnesium: 2 mg/dL (ref 1.7–2.4)

## 2016-07-27 MED ORDER — SODIUM CHLORIDE 0.9% FLUSH
10.0000 mL | INTRAVENOUS | Status: DC | PRN
Start: 2016-07-27 — End: 2016-07-27
  Administered 2016-07-27: 10 mL via INTRAVENOUS
  Filled 2016-07-27: qty 10

## 2016-07-27 MED ORDER — HEPARIN SOD (PORK) LOCK FLUSH 100 UNIT/ML IV SOLN
500.0000 [IU] | Freq: Once | INTRAVENOUS | Status: AC
  Administered 2016-07-27: 500 [IU] via INTRAVENOUS

## 2016-07-27 NOTE — Progress Notes (Signed)
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 Bronchoscopy on 09/22/2015 revealed mild to moderate chronic bronchitis changes with no endobronchial lesions. Brushings from the right lower lobe were negative for malignancy. Repeat bronchoscopy on 11/22/2015 was performed. An EBUS scope was unable to be passed in the RML. Transbronchial needle aspirations of a lesion were performed in the medial segment of the right middle lobe. Pathology revealed atypical cells. CEA and LDH were normal on 11/23/2015.  She underwent video assisted thoracoscopy (VATS) on 12/14/2015. On the undersurface of the right middle lobe there wasa palpable mass. Pathology from the right middle lobe wedge resectionrevealed adenocarcinoma, lepidic (80%), papillary (10%), and solid (10%). Pleural biopsy was negative for malignancy. EGFR, ALK, ROS1, BRAF were negative. PDL-1 was 5%.  Foundation One testing on 05/16/2016 revealed the following genomic alterations: RET E511K, KRAS G12C, MDM2 amplification, FRS2 amplification, GATA6 amplification. Tumor was microsatellite stable. 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 There were no alterations in EGFR, ALK, BRAF, MET, ERBB2, and ROS1.  PET scanon 11/21/2015 revealed a 2.9 x 3 cm central  right lower lobe lung lesion (SUV 7.0). Areas of surrounding more patchy right lower lobe opacity were also suspicious for metastasis or synchronous primaries. There were bilateral pulmonary nodules, including hypermetabolic right upper and right lower lobe pulmonary nodules suspicious for metastatic disease. There was no thoracic nodal hypermetabolism. There was an area of soft tissue fullness within the high left neck suspicious for an enlarged lateral retropharyngeal node (? skip nodal metastasis). There was indeterminate right renal lesion. Clinical stage is T3N0M1.  Head MRIon 12/31/2015 revealed atrophy and small vessel disease. There were no acute intracranial findings or intracranial metastatic disease. There was suspected metastatic disease to a 1.2 x 2.3 cmleft lateral retropharyngeal node which displaced the left ICA medially.  Neck, chest, abdomen, and pelvic CT scan on 02/09/2016 revealed a 1.3 x 2.6 cm lateral retropharyngeal lymph node (stable), increase in size of nodules in the lingula and LLL and persistent nodular consolidation in the RLL. Mediastinal adenopathy was stable.  Pretreatment labsincluded a TSH 0.278 (low), free T4 1.0 (normal), and ACTH<1.1 (low). ACTH was inadvertantly drawn the morning after receiving Decadron. Follow-up early am cortisolon 02/07/2016 was 20.3 (6.7-22.6).   She received 4 cycles of carboplatin, Alimta, and Keytruda(02/03/2016 - 04/06/2016).   PET scanon 04/24/2016 revealed new and enlarging bilateral pulmonary nodules compared to the prior exam, hypermetabolic, compatible with metastatic disease in the lungs. There was low-grade metabolic activity in mediastinal lymph nodes. There continues to be a nodular structure favoring a neck lymph node in the upper left station IIa region, mildly hypermetabolic, likely reflecting a metastatic lymph node.    Interval history- she si doing well. Appetite is good and energy levels are at  baseline. Denies any complaints today  ECOG PS- 1 Pain scale- 0   Review of systems- Review of Systems  Constitutional: Negative for chills, fever, malaise/fatigue and  weight loss.  HENT: Negative for congestion, ear discharge and nosebleeds.   Eyes: Negative for blurred vision.  Respiratory: Negative for cough, hemoptysis, sputum production, shortness of breath and wheezing.   Cardiovascular: Negative for chest pain, palpitations, orthopnea and claudication.  Gastrointestinal: Negative for abdominal pain, blood in stool, constipation, diarrhea, heartburn, melena, nausea and vomiting.  Genitourinary: Negative for dysuria, flank pain, frequency, hematuria and urgency.  Musculoskeletal: Negative for back pain, joint pain and myalgias.  Skin: Negative for rash.  Neurological: Negative for dizziness, tingling, focal weakness, seizures, weakness and headaches.  Endo/Heme/Allergies: Does not bruise/bleed easily.  Psychiatric/Behavioral: Negative for depression and suicidal ideas. The patient does not have insomnia.       Allergies  Allergen Reactions  . Augmentin [Amoxicillin-Pot Clavulanate] Hives  . Penicillins Hives    Has patient had a PCN reaction causing immediate rash, facial/tongue/throat swelling, SOB or lightheadedness with hypotension: No Has patient had a PCN reaction causing severe rash involving mucus membranes or skin necrosis: No Has patient had a PCN reaction that required hospitalization No Has patient had a PCN reaction occurring within the last 10 years: Yes If all of the above answers are "NO", then may proceed with Cephalosporin use.       Past Medical History:  Diagnosis Date  . Cancer ([place])    basal cell   . Emphysema of lung ([place])   . Hypercholesteremia   . Hypertension   . Lung mass   . Renal disorder    renal insufficiency     Past Surgical History:  Procedure Laterality Date  . BASAL CELL CARCINOMA EXCISION    . COLONOSCOPY    . ENDOBRONCHIAL  ULTRASOUND N/A 11/22/2015   Procedure: ENDOBRONCHIAL ULTRASOUND;  Surgeon: Flora Lipps, MD;  Location: [facility] ORS;  Service: Cardiopulmonary;  Laterality: N/A;  . FLEXIBLE BRONCHOSCOPY N/A 09/22/2015   Procedure: FLEXIBLE BRONCHOSCOPY;  Surgeon: [name], MD;  Location: [facility] ORS;  Service: Pulmonary;  Laterality: N/A;  . PORTACATH PLACEMENT Right 02/01/2016   Procedure: INSERTION PORT-A-CATH;  Surgeon: Nestor Lewandowsky, MD;  Location: [facility] ORS;  Service: General;  Laterality: Right;  Right internal jugular vein  . VIDEO ASSISTED THORACOSCOPY (VATS)/THOROCOTOMY Right 12/14/2015   Procedure: VIDEO ASSISTED THORACOSCOPY (VATS)/THOROCOTOMY;  Surgeon: Nestor Lewandowsky, MD;  Location: [facility] ORS;  Service: Thoracic;  Laterality: Right;    Social History   Social History  . Marital status: Single    Spouse name: N/A  . Number of children: N/A  . Years of education: N/A   Occupational History  . Not on file.   Social History Main Topics  . Smoking status: Former Smoker    Packs/day: 0.50    Years: 45.00    Types: Cigarettes    Quit date: 11/06/2015  . Smokeless tobacco: Never Used     Comment: hasn't smoked since 11-06-15  . Alcohol use Yes     Comment: rare  . Drug use: No  . Sexual activity: Not on file   Other Topics Concern  . Not on file   Social History Narrative  . No narrative on file    Family History  Problem Relation Age of Onset  . Alzheimer's disease Mother   . Heart disease Father   . Heart attack Brother      Current Outpatient Prescriptions:  .  amLODipine (NORVASC) 10 MG tablet, Take 10 mg by mouth daily., Disp: , Rfl:  .  dexamethasone (DECADRON) 4 MG tablet, Take 2 tablets (8 mg total) by  mouth 2 (two) times daily. Start the day before Taxotere. Then daily after chemo for 2 days., Disp: 30 tablet, Rfl: 1 .  folic acid (FOLVITE) 1 MG tablet, Take 1 tablet (1 mg total) by mouth daily., Disp: 30 tablet, Rfl: 3 .  hydrALAZINE (APRESOLINE) 50 MG tablet, Take 50 mg by  mouth 3 (three) times daily., Disp: , Rfl:  .  KLOR-CON 10 10 MEQ tablet, TAKE 1 TABLET (10 MEQ TOTAL) BY MOUTH 2 (TWO) TIMES DAILY., Disp: 60 tablet, Rfl: 1 .  magnesium oxide (MAG-OX) 400 MG tablet, TAKE 1 TABLET (400 MG TOTAL) BY MOUTH DAILY., Disp: 30 tablet, Rfl: 1 .  metoprolol succinate (TOPROL-XL) 100 MG 24 hr tablet, Take 100 mg by mouth daily. Take with or immediately following a meal., Disp: , Rfl:  .  nystatin (MYCOSTATIN) 100000 UNIT/ML suspension, Take 5 mLs (500,000 Units total) by mouth 4 (four) times daily., Disp: 120 mL, Rfl: 1 .  sodium bicarbonate 650 MG tablet, Take 1,300 mg by mouth 2 (two) times daily. , Disp: , Rfl:  .  traMADol (ULTRAM) 50 MG tablet, Take 1 tablet (50 mg total) by mouth every 12 (twelve) hours as needed. (Patient not taking: Reported on 07/06/2016), Disp: 30 tablet, Rfl: 0  Physical exam:  Vitals:   07/27/16 0948  BP: 121/76  Pulse: 96  Resp: 18  Temp: 97 F (36.1 C)  TempSrc: Tympanic  Weight: 139 lb 7 oz (63.2 kg)   Physical Exam  Constitutional: She is oriented to person, place, and time and well-developed, well-nourished, and in no distress.  HENT:  Head: Normocephalic and atraumatic.  Eyes: EOM are normal. Pupils are equal, round, and reactive to light.  Neck: Normal range of motion.  Cardiovascular: Normal rate, regular rhythm and normal heart sounds.   Pulmonary/Chest: Effort normal and breath sounds normal.  Abdominal: Soft. Bowel sounds are normal.  Neurological: She is alert and oriented to person, place, and time.  Skin: Skin is warm and dry.     CMP Latest Ref Rng & Units 07/27/2016  Glucose 65 - 99 mg/dL 122(H)  BUN 6 - 20 mg/dL 19  Creatinine 0.44 - 1.00 mg/dL 1.10(H)  Sodium 135 - 145 mmol/L 137  Potassium 3.5 - 5.1 mmol/L 3.8  Chloride 101 - 111 mmol/L 105  CO2 22 - 32 mmol/L 22  Calcium 8.9 - 10.3 mg/dL 9.6  Total Protein 6.5 - 8.1 g/dL 7.1  Total Bilirubin 0.3 - 1.2 mg/dL 0.3  Alkaline Phos 38 - 126 U/L 96  AST 15 -  41 U/L 22  ALT 14 - 54 U/L 11(L)   CBC Latest Ref Rng & Units 07/27/2016  WBC 3.6 - 11.0 K/uL 20.0(H)  Hemoglobin 12.0 - 16.0 g/dL 10.8(L)  Hematocrit 35.0 - 47.0 % 32.6(L)  Platelets 150 - 440 K/uL 402    No images are attached to the encounter.  Ct Abdomen Pelvis Wo Contrast  Result Date: 07/24/2016 CLINICAL DATA:  Stage IV right lower lobe adenocarcinoma diagnosed 11/22/2015. EXAM: CT CHEST, ABDOMEN AND PELVIS WITHOUT CONTRAST TECHNIQUE: Multidetector CT imaging of the chest, abdomen and pelvis was performed following the standard protocol without IV contrast. COMPARISON:  CTs 02/09/2016.  PET-CT 04/24/2016. FINDINGS: CT CHEST FINDINGS Cardiovascular: Atherosclerosis of the aorta, great vessels and coronary arteries. No acute vascular findings are demonstrated on noncontrast imaging. Right IJ central venous catheter extends to the lower SVC level. The heart size is normal. There is no pericardial effusion. Mediastinum/Nodes: Several prominent mediastinal lymph nodes  are unchanged, including a 12 mm high right paratracheal node on image 37 and an 18 mm subcarinal node on image 64. These are not significantly hypermetabolic on prior PET-CT. No axillary adenopathy. Hilar assessment is limited by the lack of intravenous contrast, although the hilar contours appear unchanged. The thyroid gland, trachea and esophagus demonstrate no significant findings. Lungs/Pleura: Stable small right pleural effusion. There is severe centrilobular and paraseptal emphysema. Volume loss and opacity in the right lower lobe are similar to the most recent PET-CT. There is increasing airspace disease posteriorly in the right upper and middle lobes with associated cavitation. There is progressive ill-defined nodularity throughout the left lung compared with the PET-CT. A left lower lobe component measuring 3.5 x 2.6 cm on image 66 previously measured 2.5 x 1.7 cm. These areas were hypermetabolic on PET-CT. Musculoskeletal/Chest  wall: No chest wall mass or suspicious osseous findings. CT ABDOMEN AND PELVIS FINDINGS Hepatobiliary: No focal hepatic lesions are identified on noncontrast imaging. There is mild gallbladder wall thickening, similar to previous studies. No significant biliary dilatation. Pancreas: Unremarkable. No pancreatic ductal dilatation or surrounding inflammatory changes. Spleen: Normal in size without focal abnormality. Adrenals/Urinary Tract: The adrenal glands appear stable without suspicious findings. The kidneys appear stable with an exophytic 10 mm lesion in the interpolar region of the right kidney (image 154). No evidence of urinary tract calculus or hydronephrosis. Renovascular calcifications are present bilaterally. The bladder appears unremarkable for its degree of distention. Stomach/Bowel: No evidence of bowel wall thickening, distention or surrounding inflammatory change. Sigmoid colon diverticular changes are present. The appendix appears normal. Vascular/Lymphatic: There are no enlarged abdominal or pelvic lymph nodes. Diffuse aortic and branch vessel atherosclerosis. Reproductive: The uterus and ovaries appear stable. No evidence of adnexal mass. Other: No evidence of abdominal wall mass or hernia. No ascites or peritoneal nodularity. Musculoskeletal: No acute or significant osseous findings. Stable lumbar spine degenerative changes. IMPRESSION: 1. Overall worsening in the pulmonary findings with multiple ill-defined pulmonary nodules and cavitary airspace opacities. These opacities were hypermetabolic on PET-CT and may reflect worsening atypical metastatic disease. Atypical infection and the affects of prior treatment may be contributory. 2. Stable small mediastinal lymph nodes, not significantly hypermetabolic on PET-CT. 3. No evidence of metastatic disease within the abdomen or pelvis. Electronically Signed   By: Richardean Sale M.D.   On: 07/24/2016 13:41   Ct Chest Wo Contrast  Result Date:  07/24/2016 CLINICAL DATA:  Stage IV right lower lobe adenocarcinoma diagnosed 11/22/2015. EXAM: CT CHEST, ABDOMEN AND PELVIS WITHOUT CONTRAST TECHNIQUE: Multidetector CT imaging of the chest, abdomen and pelvis was performed following the standard protocol without IV contrast. COMPARISON:  CTs 02/09/2016.  PET-CT 04/24/2016. FINDINGS: CT CHEST FINDINGS Cardiovascular: Atherosclerosis of the aorta, great vessels and coronary arteries. No acute vascular findings are demonstrated on noncontrast imaging. Right IJ central venous catheter extends to the lower SVC level. The heart size is normal. There is no pericardial effusion. Mediastinum/Nodes: Several prominent mediastinal lymph nodes are unchanged, including a 12 mm high right paratracheal node on image 37 and an 18 mm subcarinal node on image 64. These are not significantly hypermetabolic on prior PET-CT. No axillary adenopathy. Hilar assessment is limited by the lack of intravenous contrast, although the hilar contours appear unchanged. The thyroid gland, trachea and esophagus demonstrate no significant findings. Lungs/Pleura: Stable small right pleural effusion. There is severe centrilobular and paraseptal emphysema. Volume loss and opacity in the right lower lobe are similar to the most recent PET-CT. There is  increasing airspace disease posteriorly in the right upper and middle lobes with associated cavitation. There is progressive ill-defined nodularity throughout the left lung compared with the PET-CT. A left lower lobe component measuring 3.5 x 2.6 cm on image 66 previously measured 2.5 x 1.7 cm. These areas were hypermetabolic on PET-CT. Musculoskeletal/Chest wall: No chest wall mass or suspicious osseous findings. CT ABDOMEN AND PELVIS FINDINGS Hepatobiliary: No focal hepatic lesions are identified on noncontrast imaging. There is mild gallbladder wall thickening, similar to previous studies. No significant biliary dilatation. Pancreas: Unremarkable. No  pancreatic ductal dilatation or surrounding inflammatory changes. Spleen: Normal in size without focal abnormality. Adrenals/Urinary Tract: The adrenal glands appear stable without suspicious findings. The kidneys appear stable with an exophytic 10 mm lesion in the interpolar region of the right kidney (image 154). No evidence of urinary tract calculus or hydronephrosis. Renovascular calcifications are present bilaterally. The bladder appears unremarkable for its degree of distention. Stomach/Bowel: No evidence of bowel wall thickening, distention or surrounding inflammatory change. Sigmoid colon diverticular changes are present. The appendix appears normal. Vascular/Lymphatic: There are no enlarged abdominal or pelvic lymph nodes. Diffuse aortic and branch vessel atherosclerosis. Reproductive: The uterus and ovaries appear stable. No evidence of adnexal mass. Other: No evidence of abdominal wall mass or hernia. No ascites or peritoneal nodularity. Musculoskeletal: No acute or significant osseous findings. Stable lumbar spine degenerative changes. IMPRESSION: 1. Overall worsening in the pulmonary findings with multiple ill-defined pulmonary nodules and cavitary airspace opacities. These opacities were hypermetabolic on PET-CT and may reflect worsening atypical metastatic disease. Atypical infection and the affects of prior treatment may be contributory. 2. Stable small mediastinal lymph nodes, not significantly hypermetabolic on PET-CT. 3. No evidence of metastatic disease within the abdomen or pelvis. Electronically Signed   By: Richardean Sale M.D.   On: 07/24/2016 13:41     Assessment and plan- Patient is a 71 y.o. female with stage 4 adenocarcinoma of the lung currently on second line taxotere after progression on carbo/alimta/keytruda and has now progressed on taxotere  I reviewed her scans at tumor board with radiology yesterday and personally reviewed her images independently as well. Overall her lung  disease looks worse and this appears to be consistent with malignancy rather than infectious findings given that clinically pateint does not appear sick. She has therefore progressed on 2nd line taxotere and I would favor 3rd line palliative chemotherapy with gemcitabine 800 mg/meter square 2 weeks on and 1 week off. I will let Dr. Mike Gip decide if she wishes to do 3 week on 1 week off cycle instead.   Discussed risks and benefits of gemcitabine including all but not limited to nausea, vomiting, risk of low blood counts. Patient understands and agrees to proceed as planned. I will hold off on switching her treatment today and plan to start her chemotherapy tentatively next week. I will get in touch with Dr.Stitchcomb next week and see if she would be a candidate for any clinical trial prior to startign gemcitabine. I will call the patient after  Speak to Dr. Park Pope  rtc in 1 week with cbc, cmp for tentative gemcitabine   Visit Diagnosis 1. Cancer of lower lobe of right lung ([place])   2. Encounter for antineoplastic chemotherapy      Dr. Randa Evens, MD, MPH [facility] at [facility] Pager- [phone] 07/27/2016 1:30 PM

## 2016-07-27 NOTE — Progress Notes (Signed)
Patient states she had diarrhea after her CT scan.  Otherwise, no complaints.  Patient here today for CT results.

## 2016-08-01 ENCOUNTER — Telehealth: Payer: Self-pay | Admitting: Oncology

## 2016-08-01 ENCOUNTER — Other Ambulatory Visit: Payer: Self-pay | Admitting: Hematology and Oncology

## 2016-08-01 DIAGNOSIS — R7989 Other specified abnormal findings of blood chemistry: Secondary | ICD-10-CM

## 2016-08-01 DIAGNOSIS — C3431 Malignant neoplasm of lower lobe, right bronchus or lung: Secondary | ICD-10-CM

## 2016-08-01 DIAGNOSIS — Z5111 Encounter for antineoplastic chemotherapy: Secondary | ICD-10-CM

## 2016-08-01 DIAGNOSIS — E876 Hypokalemia: Secondary | ICD-10-CM

## 2016-08-01 NOTE — Telephone Encounter (Signed)
I called patient on 07/31/16 and informed her that I have spoken to Dr. Durenda Hurt from [facility] and discussed her case with him. I informed him that she has progressed on second line taxotere. He would like to see her next week and see if she would be a candidate for clinical trial. Therefore, plans for 3rd line gemcitabine will be on hold this week and next week. She will see DR. Corcoran in 2 weeks with labs- cbc, cmp and mg for possible gemcitabine if she is not a candidate for clinical trial. Patient verbalized understanding of the plan.  Dr. Randa Evens, MD, MPH [facility] at [facility] [phone] 08/01/2016 7:14 PM

## 2016-08-02 ENCOUNTER — Other Ambulatory Visit: Payer: Self-pay | Admitting: *Deleted

## 2016-08-02 DIAGNOSIS — C3431 Malignant neoplasm of lower lobe, right bronchus or lung: Secondary | ICD-10-CM

## 2016-08-03 ENCOUNTER — Inpatient Hospital Stay: Payer: BLUE CROSS/BLUE SHIELD | Admitting: Oncology

## 2016-08-03 ENCOUNTER — Inpatient Hospital Stay: Payer: BLUE CROSS/BLUE SHIELD

## 2016-08-09 DIAGNOSIS — C3491 Malignant neoplasm of unspecified part of right bronchus or lung: Secondary | ICD-10-CM | POA: Diagnosis not present

## 2016-08-13 ENCOUNTER — Other Ambulatory Visit: Payer: Self-pay | Admitting: Hematology and Oncology

## 2016-08-13 DIAGNOSIS — R7989 Other specified abnormal findings of blood chemistry: Secondary | ICD-10-CM

## 2016-08-13 DIAGNOSIS — E876 Hypokalemia: Secondary | ICD-10-CM

## 2016-08-13 DIAGNOSIS — C3431 Malignant neoplasm of lower lobe, right bronchus or lung: Secondary | ICD-10-CM

## 2016-08-13 DIAGNOSIS — Z5111 Encounter for antineoplastic chemotherapy: Secondary | ICD-10-CM

## 2016-08-16 ENCOUNTER — Telehealth: Payer: Self-pay | Admitting: *Deleted

## 2016-08-16 NOTE — Telephone Encounter (Signed)
Patient called and reported that she needed to cancel appts for tomorrow as she is going to [facility] for clinical trial with Dr. Aniceto Boss.  Dr. Mike Gip informed.

## 2016-08-17 ENCOUNTER — Inpatient Hospital Stay: Payer: BLUE CROSS/BLUE SHIELD

## 2016-08-17 ENCOUNTER — Inpatient Hospital Stay: Payer: BLUE CROSS/BLUE SHIELD | Admitting: Hematology and Oncology

## 2016-08-17 ENCOUNTER — Other Ambulatory Visit: Payer: Self-pay | Admitting: Hematology and Oncology

## 2016-08-17 NOTE — Telephone Encounter (Signed)
She completed Alimta 04/06/16. Does she still need Folic Acid?

## 2016-08-24 DIAGNOSIS — Z006 Encounter for examination for normal comparison and control in clinical research program: Secondary | ICD-10-CM | POA: Diagnosis not present

## 2016-08-24 DIAGNOSIS — I517 Cardiomegaly: Secondary | ICD-10-CM | POA: Diagnosis not present

## 2016-08-24 DIAGNOSIS — Z5181 Encounter for therapeutic drug level monitoring: Secondary | ICD-10-CM | POA: Diagnosis not present

## 2016-08-24 DIAGNOSIS — C342 Malignant neoplasm of middle lobe, bronchus or lung: Secondary | ICD-10-CM | POA: Diagnosis not present

## 2016-08-24 DIAGNOSIS — Z87891 Personal history of nicotine dependence: Secondary | ICD-10-CM | POA: Diagnosis not present

## 2016-08-24 DIAGNOSIS — J449 Chronic obstructive pulmonary disease, unspecified: Secondary | ICD-10-CM | POA: Diagnosis not present

## 2016-08-24 DIAGNOSIS — R9431 Abnormal electrocardiogram [ECG] [EKG]: Secondary | ICD-10-CM | POA: Diagnosis not present

## 2016-08-24 DIAGNOSIS — Z79899 Other long term (current) drug therapy: Secondary | ICD-10-CM | POA: Diagnosis not present

## 2016-09-07 DIAGNOSIS — C3491 Malignant neoplasm of unspecified part of right bronchus or lung: Secondary | ICD-10-CM | POA: Diagnosis not present

## 2016-09-07 DIAGNOSIS — Z87891 Personal history of nicotine dependence: Secondary | ICD-10-CM | POA: Diagnosis not present

## 2016-09-07 DIAGNOSIS — C342 Malignant neoplasm of middle lobe, bronchus or lung: Secondary | ICD-10-CM | POA: Diagnosis not present

## 2016-09-07 DIAGNOSIS — Z902 Acquired absence of lung [part of]: Secondary | ICD-10-CM | POA: Diagnosis not present

## 2016-09-07 DIAGNOSIS — R9431 Abnormal electrocardiogram [ECG] [EKG]: Secondary | ICD-10-CM | POA: Diagnosis not present

## 2016-09-07 DIAGNOSIS — I517 Cardiomegaly: Secondary | ICD-10-CM | POA: Diagnosis not present

## 2016-09-07 DIAGNOSIS — Z006 Encounter for examination for normal comparison and control in clinical research program: Secondary | ICD-10-CM | POA: Diagnosis not present

## 2016-09-14 DIAGNOSIS — R197 Diarrhea, unspecified: Secondary | ICD-10-CM | POA: Diagnosis not present

## 2016-09-14 DIAGNOSIS — Z79899 Other long term (current) drug therapy: Secondary | ICD-10-CM | POA: Diagnosis not present

## 2016-09-14 DIAGNOSIS — Z87891 Personal history of nicotine dependence: Secondary | ICD-10-CM | POA: Diagnosis not present

## 2016-09-14 DIAGNOSIS — C3491 Malignant neoplasm of unspecified part of right bronchus or lung: Secondary | ICD-10-CM | POA: Diagnosis not present

## 2016-09-14 DIAGNOSIS — R9431 Abnormal electrocardiogram [ECG] [EKG]: Secondary | ICD-10-CM | POA: Diagnosis not present

## 2016-09-14 DIAGNOSIS — K521 Toxic gastroenteritis and colitis: Secondary | ICD-10-CM | POA: Diagnosis not present

## 2016-09-14 DIAGNOSIS — Z006 Encounter for examination for normal comparison and control in clinical research program: Secondary | ICD-10-CM | POA: Diagnosis not present

## 2016-09-14 DIAGNOSIS — C342 Malignant neoplasm of middle lobe, bronchus or lung: Secondary | ICD-10-CM | POA: Diagnosis not present

## 2016-09-21 DIAGNOSIS — C342 Malignant neoplasm of middle lobe, bronchus or lung: Secondary | ICD-10-CM | POA: Diagnosis not present

## 2016-09-21 DIAGNOSIS — C3491 Malignant neoplasm of unspecified part of right bronchus or lung: Secondary | ICD-10-CM | POA: Diagnosis not present

## 2016-09-21 DIAGNOSIS — Z87891 Personal history of nicotine dependence: Secondary | ICD-10-CM | POA: Diagnosis not present

## 2016-09-21 DIAGNOSIS — Z006 Encounter for examination for normal comparison and control in clinical research program: Secondary | ICD-10-CM | POA: Diagnosis not present

## 2016-09-21 DIAGNOSIS — R197 Diarrhea, unspecified: Secondary | ICD-10-CM | POA: Diagnosis not present

## 2016-09-28 DIAGNOSIS — K521 Toxic gastroenteritis and colitis: Secondary | ICD-10-CM | POA: Diagnosis not present

## 2016-09-28 DIAGNOSIS — C3491 Malignant neoplasm of unspecified part of right bronchus or lung: Secondary | ICD-10-CM | POA: Diagnosis not present

## 2016-09-28 DIAGNOSIS — Z006 Encounter for examination for normal comparison and control in clinical research program: Secondary | ICD-10-CM | POA: Diagnosis not present

## 2016-10-05 DIAGNOSIS — Z79899 Other long term (current) drug therapy: Secondary | ICD-10-CM | POA: Diagnosis not present

## 2016-10-05 DIAGNOSIS — R197 Diarrhea, unspecified: Secondary | ICD-10-CM | POA: Diagnosis not present

## 2016-10-05 DIAGNOSIS — K521 Toxic gastroenteritis and colitis: Secondary | ICD-10-CM | POA: Diagnosis not present

## 2016-10-05 DIAGNOSIS — Z87891 Personal history of nicotine dependence: Secondary | ICD-10-CM | POA: Diagnosis not present

## 2016-10-05 DIAGNOSIS — C3491 Malignant neoplasm of unspecified part of right bronchus or lung: Secondary | ICD-10-CM | POA: Diagnosis not present

## 2016-10-05 DIAGNOSIS — C342 Malignant neoplasm of middle lobe, bronchus or lung: Secondary | ICD-10-CM | POA: Diagnosis not present

## 2016-10-05 DIAGNOSIS — Z006 Encounter for examination for normal comparison and control in clinical research program: Secondary | ICD-10-CM | POA: Diagnosis not present

## 2016-10-11 DIAGNOSIS — C3491 Malignant neoplasm of unspecified part of right bronchus or lung: Secondary | ICD-10-CM | POA: Diagnosis not present

## 2016-10-11 DIAGNOSIS — Z006 Encounter for examination for normal comparison and control in clinical research program: Secondary | ICD-10-CM | POA: Diagnosis not present

## 2016-10-12 ENCOUNTER — Other Ambulatory Visit: Payer: Self-pay | Admitting: Hematology and Oncology

## 2016-10-12 DIAGNOSIS — C3431 Malignant neoplasm of lower lobe, right bronchus or lung: Secondary | ICD-10-CM

## 2016-10-12 DIAGNOSIS — E876 Hypokalemia: Secondary | ICD-10-CM

## 2016-10-12 DIAGNOSIS — R7989 Other specified abnormal findings of blood chemistry: Secondary | ICD-10-CM

## 2016-10-12 DIAGNOSIS — Z5111 Encounter for antineoplastic chemotherapy: Secondary | ICD-10-CM

## 2016-11-02 DIAGNOSIS — R590 Localized enlarged lymph nodes: Secondary | ICD-10-CM | POA: Diagnosis not present

## 2016-11-02 DIAGNOSIS — C3490 Malignant neoplasm of unspecified part of unspecified bronchus or lung: Secondary | ICD-10-CM | POA: Diagnosis not present

## 2016-11-02 DIAGNOSIS — Z006 Encounter for examination for normal comparison and control in clinical research program: Secondary | ICD-10-CM | POA: Diagnosis not present

## 2016-11-02 DIAGNOSIS — Z85118 Personal history of other malignant neoplasm of bronchus and lung: Secondary | ICD-10-CM | POA: Diagnosis not present

## 2016-11-02 DIAGNOSIS — K7689 Other specified diseases of liver: Secondary | ICD-10-CM | POA: Diagnosis not present

## 2016-11-02 DIAGNOSIS — C3491 Malignant neoplasm of unspecified part of right bronchus or lung: Secondary | ICD-10-CM | POA: Diagnosis not present

## 2016-11-12 ENCOUNTER — Encounter: Payer: Self-pay | Admitting: Hematology and Oncology

## 2016-11-12 ENCOUNTER — Inpatient Hospital Stay: Payer: BLUE CROSS/BLUE SHIELD | Attending: Hematology and Oncology | Admitting: Hematology and Oncology

## 2016-11-12 ENCOUNTER — Other Ambulatory Visit: Payer: Self-pay | Admitting: Hematology and Oncology

## 2016-11-12 VITALS — BP 124/79 | HR 90 | Temp 98.0°F | Wt 144.0 lb

## 2016-11-12 DIAGNOSIS — I10 Essential (primary) hypertension: Secondary | ICD-10-CM | POA: Diagnosis not present

## 2016-11-12 DIAGNOSIS — Z9221 Personal history of antineoplastic chemotherapy: Secondary | ICD-10-CM

## 2016-11-12 DIAGNOSIS — R590 Localized enlarged lymph nodes: Secondary | ICD-10-CM

## 2016-11-12 DIAGNOSIS — Z79899 Other long term (current) drug therapy: Secondary | ICD-10-CM | POA: Diagnosis not present

## 2016-11-12 DIAGNOSIS — F17210 Nicotine dependence, cigarettes, uncomplicated: Secondary | ICD-10-CM

## 2016-11-12 DIAGNOSIS — Z5111 Encounter for antineoplastic chemotherapy: Secondary | ICD-10-CM | POA: Insufficient documentation

## 2016-11-12 DIAGNOSIS — J439 Emphysema, unspecified: Secondary | ICD-10-CM

## 2016-11-12 DIAGNOSIS — I739 Peripheral vascular disease, unspecified: Secondary | ICD-10-CM

## 2016-11-12 DIAGNOSIS — N2889 Other specified disorders of kidney and ureter: Secondary | ICD-10-CM

## 2016-11-12 DIAGNOSIS — Z85828 Personal history of other malignant neoplasm of skin: Secondary | ICD-10-CM | POA: Diagnosis not present

## 2016-11-12 DIAGNOSIS — C3431 Malignant neoplasm of lower lobe, right bronchus or lung: Secondary | ICD-10-CM

## 2016-11-12 DIAGNOSIS — Z7189 Other specified counseling: Secondary | ICD-10-CM

## 2016-11-12 NOTE — Progress Notes (Signed)
Patient here for follow up. She states that she has been on a clinical trial at [facility], but has been taken off the trial. She denies having any pain. She states that her employer gave her a voucher to go to [facility] and get her Flu Vaccine.
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
## 2016-11-16 DIAGNOSIS — C3491 Malignant neoplasm of unspecified part of right bronchus or lung: Secondary | ICD-10-CM | POA: Diagnosis not present

## 2016-11-16 DIAGNOSIS — Z006 Encounter for examination for normal comparison and control in clinical research program: Secondary | ICD-10-CM | POA: Diagnosis not present

## 2016-11-17 ENCOUNTER — Encounter: Payer: Self-pay | Admitting: Hematology and Oncology

## 2016-11-19 ENCOUNTER — Telehealth: Payer: Self-pay | Admitting: *Deleted

## 2016-11-19 NOTE — Telephone Encounter (Signed)
Patient called stating she starts a new chemotherapy this week (Gemzar) and wants to know if she would be alright to get a flu shot today. Please advise

## 2016-11-19 NOTE — Telephone Encounter (Signed)
Per VO Dr Mike Gip, it is ok to get flu shot. Left voice mail message for patient regarding this

## 2016-11-23 ENCOUNTER — Inpatient Hospital Stay: Payer: BLUE CROSS/BLUE SHIELD

## 2016-11-23 ENCOUNTER — Other Ambulatory Visit: Payer: Self-pay | Admitting: Hematology and Oncology

## 2016-11-23 ENCOUNTER — Other Ambulatory Visit: Payer: Self-pay | Admitting: *Deleted

## 2016-11-23 ENCOUNTER — Inpatient Hospital Stay (HOSPITAL_BASED_OUTPATIENT_CLINIC_OR_DEPARTMENT_OTHER): Payer: BLUE CROSS/BLUE SHIELD | Admitting: Hematology and Oncology

## 2016-11-23 VITALS — BP 139/88 | HR 85 | Temp 97.8°F | Resp 20 | Wt 143.4 lb

## 2016-11-23 DIAGNOSIS — Z85828 Personal history of other malignant neoplasm of skin: Secondary | ICD-10-CM

## 2016-11-23 DIAGNOSIS — F17210 Nicotine dependence, cigarettes, uncomplicated: Secondary | ICD-10-CM | POA: Diagnosis not present

## 2016-11-23 DIAGNOSIS — C3431 Malignant neoplasm of lower lobe, right bronchus or lung: Secondary | ICD-10-CM

## 2016-11-23 DIAGNOSIS — J439 Emphysema, unspecified: Secondary | ICD-10-CM

## 2016-11-23 DIAGNOSIS — N2889 Other specified disorders of kidney and ureter: Secondary | ICD-10-CM

## 2016-11-23 DIAGNOSIS — R590 Localized enlarged lymph nodes: Secondary | ICD-10-CM

## 2016-11-23 DIAGNOSIS — Z85118 Personal history of other malignant neoplasm of bronchus and lung: Secondary | ICD-10-CM

## 2016-11-23 DIAGNOSIS — Z79899 Other long term (current) drug therapy: Secondary | ICD-10-CM | POA: Diagnosis not present

## 2016-11-23 DIAGNOSIS — Z7189 Other specified counseling: Secondary | ICD-10-CM

## 2016-11-23 DIAGNOSIS — I739 Peripheral vascular disease, unspecified: Secondary | ICD-10-CM

## 2016-11-23 DIAGNOSIS — Z9221 Personal history of antineoplastic chemotherapy: Secondary | ICD-10-CM | POA: Diagnosis not present

## 2016-11-23 DIAGNOSIS — Z5111 Encounter for antineoplastic chemotherapy: Secondary | ICD-10-CM

## 2016-11-23 DIAGNOSIS — I10 Essential (primary) hypertension: Secondary | ICD-10-CM

## 2016-11-23 LAB — COMPREHENSIVE METABOLIC PANEL
ALT: 12 U/L — ABNORMAL LOW (ref 14–54)
AST: 22 U/L (ref 15–41)
Albumin: 3.7 g/dL (ref 3.5–5.0)
Alkaline Phosphatase: 108 U/L (ref 38–126)
Anion gap: 11 (ref 5–15)
BUN: 13 mg/dL (ref 6–20)
CO2: 23 mmol/L (ref 22–32)
Calcium: 9.2 mg/dL (ref 8.9–10.3)
Chloride: 103 mmol/L (ref 101–111)
Creatinine, Ser: 1.11 mg/dL — ABNORMAL HIGH (ref 0.44–1.00)
GFR calc Af Amer: 56 mL/min — ABNORMAL LOW (ref >60)
GFR calc non Af Amer: 48 mL/min — ABNORMAL LOW (ref >60)
Glucose, Bld: 124 mg/dL — ABNORMAL HIGH (ref 65–99)
Potassium: 3.9 mmol/L (ref 3.5–5.1)
Sodium: 137 mmol/L (ref 135–145)
Total Bilirubin: 0.7 mg/dL (ref 0.3–1.2)
Total Protein: 7.2 g/dL (ref 6.5–8.1)

## 2016-11-23 LAB — CBC WITH DIFFERENTIAL/PLATELET
Basophils Absolute: 0.1 10*3/uL (ref 0–0.1)
Basophils Relative: 1 %
Eosinophils Absolute: 0.4 10*3/uL (ref 0–0.7)
Eosinophils Relative: 4 %
HCT: 36.5 % (ref 35.0–47.0)
Hemoglobin: 12.5 g/dL (ref 12.0–16.0)
Lymphocytes Relative: 12 %
Lymphs Abs: 1.1 10*3/uL (ref 1.0–3.6)
MCH: 31.9 pg (ref 26.0–34.0)
MCHC: 34.1 g/dL (ref 32.0–36.0)
MCV: 93.5 fL (ref 80.0–100.0)
Monocytes Absolute: 0.8 10*3/uL (ref 0.2–0.9)
Monocytes Relative: 9 %
Neutro Abs: 6.9 10*3/uL — ABNORMAL HIGH (ref 1.4–6.5)
Neutrophils Relative %: 74 %
Platelets: 410 10*3/uL (ref 150–440)
RBC: 3.91 MIL/uL (ref 3.80–5.20)
RDW: 15.2 % — ABNORMAL HIGH (ref 11.5–14.5)
WBC: 9.2 10*3/uL (ref 3.6–11.0)

## 2016-11-23 LAB — MAGNESIUM: Magnesium: 2.1 mg/dL (ref 1.7–2.4)

## 2016-11-23 MED ORDER — SODIUM CHLORIDE 0.9 % IV SOLN
1600.0000 mg | Freq: Once | INTRAVENOUS | Status: AC
  Administered 2016-11-23: 1600 mg via INTRAVENOUS
  Filled 2016-11-23: qty 26.3

## 2016-11-23 MED ORDER — SODIUM CHLORIDE 0.9 % IV SOLN
Freq: Once | INTRAVENOUS | Status: AC
  Administered 2016-11-23: 10:00:00 via INTRAVENOUS
  Filled 2016-11-23: qty 1000

## 2016-11-23 MED ORDER — SODIUM CHLORIDE 0.9% FLUSH
10.0000 mL | INTRAVENOUS | Status: DC | PRN
  Administered 2016-11-23: 10 mL
  Filled 2016-11-23: qty 10

## 2016-11-23 MED ORDER — HEPARIN SOD (PORK) LOCK FLUSH 100 UNIT/ML IV SOLN
500.0000 [IU] | Freq: Once | INTRAVENOUS | Status: AC | PRN
  Administered 2016-11-23: 500 [IU]
  Filled 2016-11-23: qty 5

## 2016-11-23 MED ORDER — ONDANSETRON HCL 4 MG PO TABS
8.0000 mg | ORAL_TABLET | Freq: Once | ORAL | Status: AC
  Administered 2016-11-23: 8 mg via ORAL
  Filled 2016-11-23: qty 2

## 2016-11-23 NOTE — Patient Instructions (Signed)

## 2016-11-23 NOTE — Progress Notes (Signed)
Patient states she has more SOB on exertion.  Otherwise, no complaints.
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
## 2016-11-24 LAB — CEA: CEA: 3.2 ng/mL (ref 0.0–4.7)

## 2016-11-25 ENCOUNTER — Encounter: Payer: Self-pay | Admitting: Hematology and Oncology

## 2016-11-30 ENCOUNTER — Inpatient Hospital Stay: Payer: BLUE CROSS/BLUE SHIELD | Attending: Hematology and Oncology

## 2016-11-30 ENCOUNTER — Inpatient Hospital Stay: Payer: BLUE CROSS/BLUE SHIELD

## 2016-11-30 ENCOUNTER — Inpatient Hospital Stay (HOSPITAL_BASED_OUTPATIENT_CLINIC_OR_DEPARTMENT_OTHER): Payer: BLUE CROSS/BLUE SHIELD | Admitting: Hematology and Oncology

## 2016-11-30 ENCOUNTER — Encounter: Payer: Self-pay | Admitting: Hematology and Oncology

## 2016-11-30 ENCOUNTER — Other Ambulatory Visit: Payer: Self-pay | Admitting: *Deleted

## 2016-11-30 ENCOUNTER — Other Ambulatory Visit: Payer: Self-pay | Admitting: Hematology and Oncology

## 2016-11-30 VITALS — BP 113/75 | HR 81 | Temp 97.0°F | Resp 18 | Wt 139.8 lb

## 2016-11-30 DIAGNOSIS — E7800 Pure hypercholesterolemia, unspecified: Secondary | ICD-10-CM

## 2016-11-30 DIAGNOSIS — Z79899 Other long term (current) drug therapy: Secondary | ICD-10-CM | POA: Diagnosis not present

## 2016-11-30 DIAGNOSIS — Z87891 Personal history of nicotine dependence: Secondary | ICD-10-CM | POA: Diagnosis not present

## 2016-11-30 DIAGNOSIS — R531 Weakness: Secondary | ICD-10-CM | POA: Diagnosis not present

## 2016-11-30 DIAGNOSIS — R5383 Other fatigue: Secondary | ICD-10-CM | POA: Insufficient documentation

## 2016-11-30 DIAGNOSIS — N2889 Other specified disorders of kidney and ureter: Secondary | ICD-10-CM | POA: Insufficient documentation

## 2016-11-30 DIAGNOSIS — J449 Chronic obstructive pulmonary disease, unspecified: Secondary | ICD-10-CM

## 2016-11-30 DIAGNOSIS — E860 Dehydration: Secondary | ICD-10-CM | POA: Insufficient documentation

## 2016-11-30 DIAGNOSIS — Z5111 Encounter for antineoplastic chemotherapy: Secondary | ICD-10-CM | POA: Diagnosis not present

## 2016-11-30 DIAGNOSIS — Z85828 Personal history of other malignant neoplasm of skin: Secondary | ICD-10-CM | POA: Insufficient documentation

## 2016-11-30 DIAGNOSIS — R062 Wheezing: Secondary | ICD-10-CM | POA: Insufficient documentation

## 2016-11-30 DIAGNOSIS — R590 Localized enlarged lymph nodes: Secondary | ICD-10-CM | POA: Diagnosis not present

## 2016-11-30 DIAGNOSIS — Z85118 Personal history of other malignant neoplasm of bronchus and lung: Secondary | ICD-10-CM

## 2016-11-30 DIAGNOSIS — L509 Urticaria, unspecified: Secondary | ICD-10-CM | POA: Insufficient documentation

## 2016-11-30 DIAGNOSIS — R0902 Hypoxemia: Secondary | ICD-10-CM | POA: Insufficient documentation

## 2016-11-30 DIAGNOSIS — E871 Hypo-osmolality and hyponatremia: Secondary | ICD-10-CM | POA: Diagnosis not present

## 2016-11-30 DIAGNOSIS — R000 Tachycardia, unspecified: Secondary | ICD-10-CM | POA: Diagnosis not present

## 2016-11-30 DIAGNOSIS — D649 Anemia, unspecified: Secondary | ICD-10-CM | POA: Diagnosis not present

## 2016-11-30 DIAGNOSIS — I10 Essential (primary) hypertension: Secondary | ICD-10-CM | POA: Insufficient documentation

## 2016-11-30 DIAGNOSIS — R944 Abnormal results of kidney function studies: Secondary | ICD-10-CM | POA: Insufficient documentation

## 2016-11-30 DIAGNOSIS — R21 Rash and other nonspecific skin eruption: Secondary | ICD-10-CM | POA: Insufficient documentation

## 2016-11-30 DIAGNOSIS — D696 Thrombocytopenia, unspecified: Secondary | ICD-10-CM | POA: Insufficient documentation

## 2016-11-30 DIAGNOSIS — C3431 Malignant neoplasm of lower lobe, right bronchus or lung: Secondary | ICD-10-CM

## 2016-11-30 DIAGNOSIS — R05 Cough: Secondary | ICD-10-CM | POA: Diagnosis not present

## 2016-11-30 DIAGNOSIS — Z7189 Other specified counseling: Secondary | ICD-10-CM

## 2016-11-30 DIAGNOSIS — R110 Nausea: Secondary | ICD-10-CM | POA: Diagnosis not present

## 2016-11-30 LAB — COMPREHENSIVE METABOLIC PANEL
ALT: 23 U/L (ref 14–54)
AST: 27 U/L (ref 15–41)
Albumin: 3.6 g/dL (ref 3.5–5.0)
Alkaline Phosphatase: 107 U/L (ref 38–126)
Anion gap: 7 (ref 5–15)
BUN: 16 mg/dL (ref 6–20)
CO2: 24 mmol/L (ref 22–32)
Calcium: 8.9 mg/dL (ref 8.9–10.3)
Chloride: 103 mmol/L (ref 101–111)
Creatinine, Ser: 1.12 mg/dL — ABNORMAL HIGH (ref 0.44–1.00)
GFR calc Af Amer: 55 mL/min — ABNORMAL LOW (ref >60)
GFR calc non Af Amer: 48 mL/min — ABNORMAL LOW (ref >60)
Glucose, Bld: 146 mg/dL — ABNORMAL HIGH (ref 65–99)
Potassium: 3.8 mmol/L (ref 3.5–5.1)
Sodium: 134 mmol/L — ABNORMAL LOW (ref 135–145)
Total Bilirubin: 0.7 mg/dL (ref 0.3–1.2)
Total Protein: 7 g/dL (ref 6.5–8.1)

## 2016-11-30 LAB — CBC WITH DIFFERENTIAL/PLATELET
Basophils Absolute: 0 10*3/uL (ref 0–0.1)
Basophils Relative: 1 %
Eosinophils Absolute: 0.1 10*3/uL (ref 0–0.7)
Eosinophils Relative: 2 %
HCT: 34.6 % — ABNORMAL LOW (ref 35.0–47.0)
Hemoglobin: 11.5 g/dL — ABNORMAL LOW (ref 12.0–16.0)
Lymphocytes Relative: 23 %
Lymphs Abs: 1.2 10*3/uL (ref 1.0–3.6)
MCH: 31.4 pg (ref 26.0–34.0)
MCHC: 33.4 g/dL (ref 32.0–36.0)
MCV: 94.2 fL (ref 80.0–100.0)
Monocytes Absolute: 0.5 10*3/uL (ref 0.2–0.9)
Monocytes Relative: 9 %
Neutro Abs: 3.4 10*3/uL (ref 1.4–6.5)
Neutrophils Relative %: 65 %
Platelets: 236 10*3/uL (ref 150–440)
RBC: 3.67 MIL/uL — ABNORMAL LOW (ref 3.80–5.20)
RDW: 15.4 % — ABNORMAL HIGH (ref 11.5–14.5)
WBC: 5.3 10*3/uL (ref 3.6–11.0)

## 2016-11-30 LAB — MAGNESIUM: Magnesium: 2.1 mg/dL (ref 1.7–2.4)

## 2016-11-30 MED ORDER — SODIUM CHLORIDE 0.9% FLUSH
10.0000 mL | INTRAVENOUS | Status: DC | PRN
  Administered 2016-11-30: 10 mL via INTRAVENOUS
  Filled 2016-11-30: qty 10

## 2016-11-30 MED ORDER — HEPARIN SOD (PORK) LOCK FLUSH 100 UNIT/ML IV SOLN
500.0000 [IU] | Freq: Once | INTRAVENOUS | Status: AC
  Administered 2016-11-30: 500 [IU] via INTRAVENOUS
  Filled 2016-11-30: qty 5

## 2016-11-30 MED ORDER — ONDANSETRON HCL 4 MG PO TABS
8.0000 mg | ORAL_TABLET | Freq: Once | ORAL | Status: AC
  Administered 2016-11-30: 8 mg via ORAL
  Filled 2016-11-30: qty 2

## 2016-11-30 MED ORDER — SODIUM CHLORIDE 0.9 % IV SOLN
1600.0000 mg | Freq: Once | INTRAVENOUS | Status: AC
  Administered 2016-11-30: 1600 mg via INTRAVENOUS
  Filled 2016-11-30: qty 26.3

## 2016-11-30 MED ORDER — SODIUM CHLORIDE 0.9 % IV SOLN
Freq: Once | INTRAVENOUS | Status: AC
  Administered 2016-11-30: 10:00:00 via INTRAVENOUS
  Filled 2016-11-30: qty 1000

## 2016-11-30 NOTE — Progress Notes (Signed)
Patient is here for follow up she is doing well no complaints  
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
## 2016-12-04 ENCOUNTER — Inpatient Hospital Stay (HOSPITAL_BASED_OUTPATIENT_CLINIC_OR_DEPARTMENT_OTHER): Payer: BLUE CROSS/BLUE SHIELD | Admitting: Oncology

## 2016-12-04 ENCOUNTER — Inpatient Hospital Stay: Payer: BLUE CROSS/BLUE SHIELD

## 2016-12-04 ENCOUNTER — Telehealth: Payer: Self-pay | Admitting: *Deleted

## 2016-12-04 ENCOUNTER — Other Ambulatory Visit: Payer: Self-pay | Admitting: Hematology and Oncology

## 2016-12-04 VITALS — BP 110/75 | HR 98

## 2016-12-04 DIAGNOSIS — Z79899 Other long term (current) drug therapy: Secondary | ICD-10-CM

## 2016-12-04 DIAGNOSIS — R5383 Other fatigue: Secondary | ICD-10-CM | POA: Diagnosis not present

## 2016-12-04 DIAGNOSIS — R944 Abnormal results of kidney function studies: Secondary | ICD-10-CM

## 2016-12-04 DIAGNOSIS — C3431 Malignant neoplasm of lower lobe, right bronchus or lung: Secondary | ICD-10-CM

## 2016-12-04 DIAGNOSIS — D649 Anemia, unspecified: Secondary | ICD-10-CM | POA: Diagnosis not present

## 2016-12-04 DIAGNOSIS — E860 Dehydration: Secondary | ICD-10-CM | POA: Diagnosis not present

## 2016-12-04 DIAGNOSIS — R059 Cough, unspecified: Secondary | ICD-10-CM

## 2016-12-04 DIAGNOSIS — Z87891 Personal history of nicotine dependence: Secondary | ICD-10-CM

## 2016-12-04 DIAGNOSIS — E7800 Pure hypercholesterolemia, unspecified: Secondary | ICD-10-CM

## 2016-12-04 DIAGNOSIS — R590 Localized enlarged lymph nodes: Secondary | ICD-10-CM

## 2016-12-04 DIAGNOSIS — E871 Hypo-osmolality and hyponatremia: Secondary | ICD-10-CM | POA: Diagnosis not present

## 2016-12-04 DIAGNOSIS — R110 Nausea: Secondary | ICD-10-CM | POA: Diagnosis not present

## 2016-12-04 DIAGNOSIS — N2889 Other specified disorders of kidney and ureter: Secondary | ICD-10-CM

## 2016-12-04 DIAGNOSIS — R05 Cough: Secondary | ICD-10-CM

## 2016-12-04 DIAGNOSIS — D696 Thrombocytopenia, unspecified: Secondary | ICD-10-CM

## 2016-12-04 DIAGNOSIS — Z85828 Personal history of other malignant neoplasm of skin: Secondary | ICD-10-CM

## 2016-12-04 DIAGNOSIS — I10 Essential (primary) hypertension: Secondary | ICD-10-CM

## 2016-12-04 DIAGNOSIS — R531 Weakness: Secondary | ICD-10-CM | POA: Diagnosis not present

## 2016-12-04 DIAGNOSIS — R112 Nausea with vomiting, unspecified: Secondary | ICD-10-CM

## 2016-12-04 DIAGNOSIS — J449 Chronic obstructive pulmonary disease, unspecified: Secondary | ICD-10-CM | POA: Diagnosis not present

## 2016-12-04 LAB — COMPREHENSIVE METABOLIC PANEL
ALK PHOS: 97 U/L (ref 38–126)
ALT: 19 U/L (ref 14–54)
AST: 27 U/L (ref 15–41)
Albumin: 3.2 g/dL — ABNORMAL LOW (ref 3.5–5.0)
Anion gap: 11 (ref 5–15)
BUN: 21 mg/dL — AB (ref 6–20)
CALCIUM: 8.7 mg/dL — AB (ref 8.9–10.3)
CHLORIDE: 96 mmol/L — AB (ref 101–111)
CO2: 22 mmol/L (ref 22–32)
CREATININE: 1.11 mg/dL — AB (ref 0.44–1.00)
GFR, EST AFRICAN AMERICAN: 56 mL/min — AB (ref >60)
GFR, EST NON AFRICAN AMERICAN: 48 mL/min — AB (ref >60)
Glucose, Bld: 115 mg/dL — ABNORMAL HIGH (ref 65–99)
Potassium: 4.1 mmol/L (ref 3.5–5.1)
Sodium: 129 mmol/L — ABNORMAL LOW (ref 135–145)
Total Bilirubin: 0.7 mg/dL (ref 0.3–1.2)
Total Protein: 6.9 g/dL (ref 6.5–8.1)

## 2016-12-04 LAB — CBC WITH DIFFERENTIAL/PLATELET
BASOS PCT: 0 %
Basophils Absolute: 0 10*3/uL (ref 0–0.1)
EOS ABS: 0 10*3/uL (ref 0–0.7)
EOS PCT: 0 %
HCT: 33.8 % — ABNORMAL LOW (ref 35.0–47.0)
Hemoglobin: 11 g/dL — ABNORMAL LOW (ref 12.0–16.0)
LYMPHS ABS: 0.4 10*3/uL — AB (ref 1.0–3.6)
Lymphocytes Relative: 6 %
MCH: 30.8 pg (ref 26.0–34.0)
MCHC: 32.5 g/dL (ref 32.0–36.0)
MCV: 94.7 fL (ref 80.0–100.0)
MONOS PCT: 1 %
Monocytes Absolute: 0.1 10*3/uL — ABNORMAL LOW (ref 0.2–0.9)
NEUTROS PCT: 93 %
Neutro Abs: 6.2 10*3/uL (ref 1.4–6.5)
PLATELETS: 127 10*3/uL — AB (ref 150–440)
RBC: 3.56 MIL/uL — AB (ref 3.80–5.20)
RDW: 15.3 % — ABNORMAL HIGH (ref 11.5–14.5)
WBC: 6.8 10*3/uL (ref 3.6–11.0)

## 2016-12-04 MED ORDER — SODIUM CHLORIDE 0.9 % IV SOLN
Freq: Once | INTRAVENOUS | Status: AC
  Administered 2016-12-04: 16:00:00 via INTRAVENOUS
  Filled 2016-12-04: qty 4

## 2016-12-04 MED ORDER — SODIUM CHLORIDE 0.9 % IV SOLN
INTRAVENOUS | Status: DC
  Filled 2016-12-04: qty 1000

## 2016-12-04 MED ORDER — SODIUM CHLORIDE 0.9 % IV SOLN
INTRAVENOUS | Status: DC
  Administered 2016-12-04: 15:00:00 via INTRAVENOUS
  Filled 2016-12-04 (×2): qty 1000

## 2016-12-04 MED ORDER — BENZONATATE 200 MG PO CAPS: 200.0000 mg | ORAL_CAPSULE | Freq: Three times a day (TID) | ORAL | 0 refills | Status: DC | PRN

## 2016-12-04 MED ORDER — HEPARIN SOD (PORK) LOCK FLUSH 100 UNIT/ML IV SOLN
INTRAVENOUS | Status: AC
  Filled 2016-12-04: qty 5

## 2016-12-04 MED ORDER — SODIUM CHLORIDE 0.9 % IV SOLN
Freq: Once | INTRAVENOUS | Status: DC
  Filled 2016-12-04: qty 4

## 2016-12-04 NOTE — Telephone Encounter (Signed)
Patient called answering service stating she had treatment Friday and thinks she is dehydrated, has no energy. Please advise

## 2016-12-04 NOTE — Telephone Encounter (Signed)
Patient scheduled to come in  now

## 2016-12-04 NOTE — Telephone Encounter (Signed)
Have her come in to be seen in the symptom management clinic. I have spoken with Sonia Baller, and she has agreed to see her.

## 2016-12-04 NOTE — Progress Notes (Signed)
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 Mike Gip on 11/23/2016 for cycle 1 day 1 of gemcitabine and 11/30/2016 for cycle 1 day 8 of gemcitabine. She stated that she felt fine and denied any physical complaints. She noted feeling a  little tired post chemotherapy but it was not bad. She had nausea wihtout vomiting for one night between treatment relieved with Zofran. She was eating well with no weight loss and continued to work 4 days per week.   Patient presents today for for "severe weakness and lack of energy". Beginning on Friday after her cycle 1 day 8 of gemcitabine she developed 1 episode of nausea relieved by Zofran. Saturday when she woke up she felt well and continued to eat without nausea or vomiting and felt "tired" but was "okay". On Sunday when she woke up she felt severely dehydrated, lacked an appetite and weak. Her partner stated that she had to force Gatorade and Ensure the past 2 days and she has barely eaten anything. The patient states that "nothing tastes good" and she feels as she will vomit if she eats anything. She complains of a continued chronic cough and exertional shortness of breath. She denies fevers or chest pain. Her last bowel movement was this morning and she denies any urinary complaints. She denies pain.  ECOG FS:1 - Symptomatic but  completely ambulatory  Review of systems- Review of Systems  Constitutional: Positive for malaise/fatigue. Negative for chills and fever.  HENT: Negative.   Eyes: Negative.   Respiratory: Positive for cough.   Cardiovascular: Negative.  Negative for chest pain and leg swelling.  Gastrointestinal: Positive for nausea and vomiting. Negative for abdominal pain, constipation and diarrhea.  Genitourinary: Negative for dysuria and urgency.  Musculoskeletal: Negative.   Skin: Negative.   Neurological: Positive for weakness. Negative for dizziness and headaches.  Endo/Heme/Allergies: Negative.   Psychiatric/Behavioral: Negative.      Current treatment- S/P Cycle 1 day 8 of Gemcitabine on 11/30/16.   Allergies  Allergen Reactions  . Augmentin [Amoxicillin-Pot Clavulanate] Hives  . Penicillins Hives    Has patient had a PCN reaction causing immediate rash, facial/tongue/throat swelling, SOB or lightheadedness with hypotension: No Has patient had a PCN reaction causing severe rash involving mucus membranes or skin necrosis: No Has patient had a PCN reaction that required hospitalization No Has patient had a PCN reaction occurring within the last 10 years: Yes If all of the above answers are "NO", then may proceed with Cephalosporin use.       Past Medical History:  Diagnosis Date  . Cancer ([place])    basal cell   . Emphysema of lung ([place])   . Hypercholesteremia   . Hypertension   . Lung mass   . Renal disorder    renal insufficiency     Past Surgical History:  Procedure Laterality Date  . BASAL CELL CARCINOMA EXCISION    . COLONOSCOPY  Social History   Socioeconomic History  . Marital status: Single    Spouse name: Not on file  . Number of children: Not on file  . Years of education: Not on file  . Highest education level: Not on file  Social Needs  . Financial resource strain: Not on file  . Food insecurity - worry: Not on file  . Food insecurity - inability: Not  on file  . Transportation needs - medical: Not on file  . Transportation needs - non-medical: Not on file  Occupational History  . Not on file  Tobacco Use  . Smoking status: Former Smoker    Packs/day: 0.50    Years: 45.00    Pack years: 22.50    Types: Cigarettes    Last attempt to quit: 11/06/2015    Years since quitting: 1.0  . Smokeless tobacco: Never Used  . Tobacco comment: hasn't smoked since 11-06-15  Substance and Sexual Activity  . Alcohol use: Yes    Comment: rare  . Drug use: No  . Sexual activity: Not on file  Other Topics Concern  . Not on file  Social History Narrative  . Not on file    Family History  Problem Relation Age of Onset  . Alzheimer's disease Mother   . Heart disease Father   . Heart attack Brother      Current Outpatient Medications:  .  amLODipine (NORVASC) 10 MG tablet, Take 10 mg by mouth daily., Disp: , Rfl:  .  benzonatate (TESSALON) 200 MG capsule, Take 1 capsule (200 mg total) 3 (three) times daily as needed by mouth for cough., Disp: 20 capsule, Rfl: 0 .  hydrALAZINE (APRESOLINE) 50 MG tablet, Take 50 mg by mouth 3 (three) times daily., Disp: , Rfl:  .  KLOR-CON 10 10 MEQ tablet, TAKE 1 TABLET (10 MEQ TOTAL) BY MOUTH 2 (TWO) TIMES DAILY., Disp: 60 tablet, Rfl: 1 .  magnesium oxide (MAG-OX) 400 MG tablet, TAKE 1 TABLET (400 MG TOTAL) BY MOUTH DAILY., Disp: 30 tablet, Rfl: 1 .  metoprolol succinate (TOPROL-XL) 100 MG 24 hr tablet, Take 100 mg by mouth daily. Take with or immediately following a meal., Disp: , Rfl:  .  sodium bicarbonate 650 MG tablet, Take 1,300 mg by mouth daily. , Disp: , Rfl:   Current Facility-Administered Medications:  .  0.9 %  sodium chloride infusion, , Intravenous, Continuous, Burns, Wandra Feinstein, NP  Physical exam:  Vitals:   12/04/16 1553  BP: 110/75  Pulse: 98   Physical Exam  Constitutional: She is oriented to person, place, and time and well-developed, well-nourished, and in no distress. Vital signs are  normal. She appears dehydrated.  HENT:  Head: Normocephalic and atraumatic.  Mouth/Throat: Mucous membranes are pale and dry. No oropharyngeal exudate.  Eyes: Pupils are equal, round, and reactive to light.  Neck: Normal range of motion. Neck supple.  Cardiovascular: Normal rate and regular rhythm.  Pulmonary/Chest: Breath sounds normal.  Abdominal: Soft. Normal appearance and bowel sounds are normal.  Musculoskeletal: Normal range of motion.  Neurological: She is alert and oriented to person, place, and time.  Skin: Skin is warm, dry and intact. There is pallor.  Nursing note and vitals reviewed.    CMP Latest Ref Rng & Units 12/04/2016  Glucose 65 - 99 mg/dL 115(H)  BUN 6 - 20 mg/dL 21(H)  Creatinine 0.44 - 1.00 mg/dL 1.11(H)  Sodium 135 - 145 mmol/L 129(L)  Potassium 3.5 - 5.1 mmol/L 4.1  Chloride  101 - 111 mmol/L 96(L)  CO2 22 - 32 mmol/L 22  Calcium 8.9 - 10.3 mg/dL 8.7(L)  Total Protein 6.5 - 8.1 g/dL 6.9  Total Bilirubin 0.3 - 1.2 mg/dL 0.7  Alkaline Phos 38 - 126 U/L 97  AST 15 - 41 U/L 27  ALT 14 - 54 U/L 19   CBC Latest Ref Rng & Units 12/04/2016  WBC 3.6 - 11.0 K/uL 6.8  Hemoglobin 12.0 - 16.0 g/dL 11.0(L)  Hematocrit 35.0 - 47.0 % 33.8(L)  Platelets 150 - 440 K/uL 127(L)    No images are attached to the encounter.  No results found.   Assessment and plan- Patient is a 71 y.o. female who presents for new onset weakness, fatigue and decline in appetite. On examination, patient appears pale and tired. Her oral mucous membranes are visibly dry. No exudate present. Lungs were clear but diminished bilaterally. She is afebrile. Blood pressure and heart rate within normal limits. Labs reveal hyponatremia (129), Slightly elevated kidney function (BUN 21), anemia (11.0) and thrombocytopenia (127).   1. Dehydration/hyponatremia/elevated BUN: 1 Liter Normal Saline. Encouraged her to push fluids at home. Possibly schedule fluids between treatments. Patient may need dietary  consult in the future. 2. Nausea: IV Zofran and IV Decadron during infusion. Continue antiemetics as needed at home.  3. Weakness: 10 mg IV Decadron and 1 liter IV fluids given today.  4. Cough: RX 200 mgTessalon. Patient educated on side effects of medication.  5. RTC as scheduled for labs/MD assess and Cycle 1 Day 15 Gemcitabine on Friday.  Addendum: Patient assessed midway and at the completion of her IV fluids, IV Zofran and IV Decadron. The patient stated that she felt better. Her blood pressure remained normal at 112/81. Heart rate stable at 95.    Visit Diagnosis 1. Dehydration   2. Hyponatremia   3. Non-intractable vomiting with nausea, unspecified vomiting type   4. Cough in adult   5. Weakness     Patient expressed understanding and was in agreement with this plan. She also understands that She can call clinic at any time with any questions, concerns, or complaints.   Greater than 50% was spent in counseling and coordination of care with this patient including but not limited to discussion of the relevant topics above (See A&P) including, but not limited to diagnosis and management of acute and chronic medical conditions.    Faythe Casa, AGNP-C [facility] [place]- [phone] Pager- [phone] 12/05/2016 9:07 AM

## 2016-12-06 IMAGING — MR MR HEAD WO/W CM
12 of 13 series · 41 of 48 positions shown · IV contrast (multihance)
Comparison: PET scan 11/21/2015.

CLINICAL DATA: History of lung cancer, staging.

EXAM:
MRI HEAD WITHOUT AND WITH CONTRAST
TECHNIQUE: Multiplanar, multiecho pulse sequences of the brain and surrounding
structures were obtained without and with intravenous contrast.
CONTRAST:  15mL MULTIHANCE GADOBENATE DIMEGLUMINE 529 MG/ML IV SOLN

[Series 2: T1 · sagittal · 5.0mm · 0.45mm/px · 3 of 27 slices shown (1 of 2)]
[im 1/27]
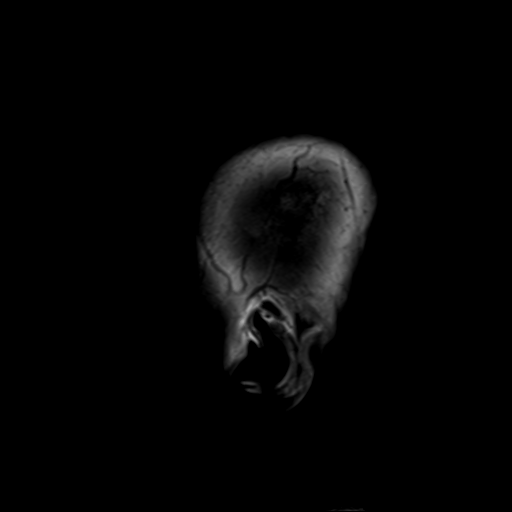
[im 14/27]
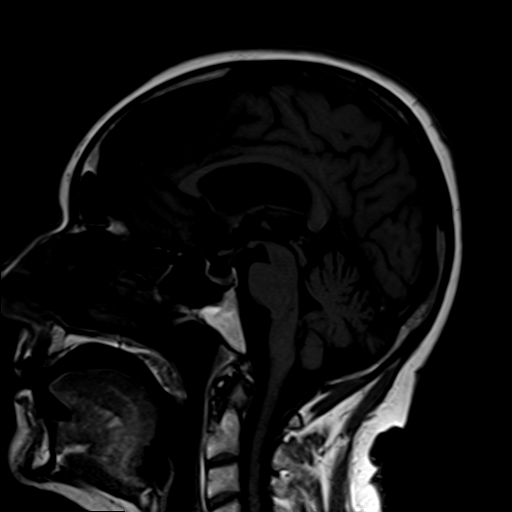
[im 27/27]
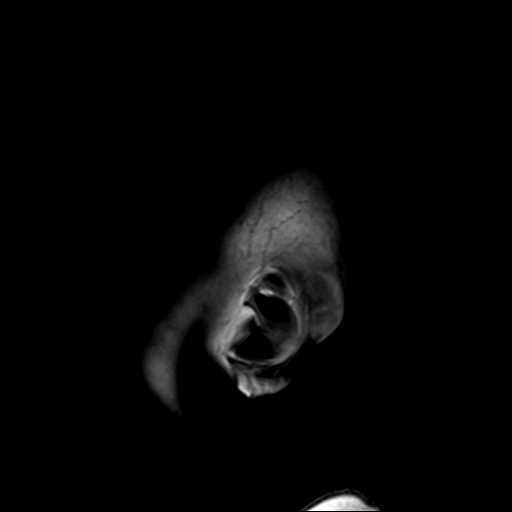

[Series 4: DWI · axial · 3.0mm · 1.80mm/px · z∈[-51,+111]mm · 5 of 55 slices shown (1 of 2)]
[im 1/55]
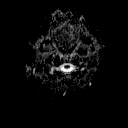
[im 14/55]
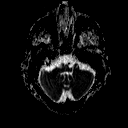
[im 28/55]
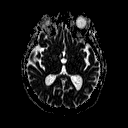
[im 41/55]
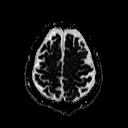
[im 55/55]
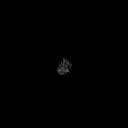

[Series 6: DWI · coronal · 3.0mm · 1.80mm/px · 4 of 45 slices shown (2 of 2)]
[im 1/45]
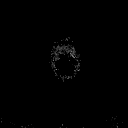
[im 15/45]
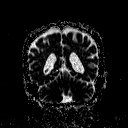
[im 30/45]
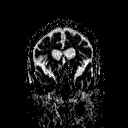
[im 45/45]
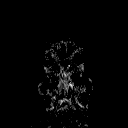

[Series 7: T2 · axial · 5.0mm · 0.60mm/px · z∈[-54,+102]mm · 2 of 25 slices shown (1 of 2)]
[im 1/25]
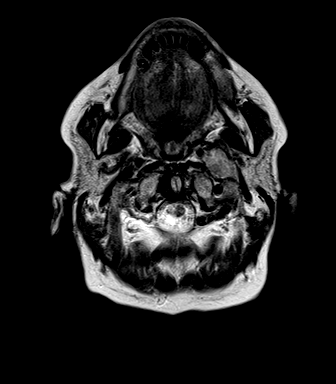
[im 25/25]
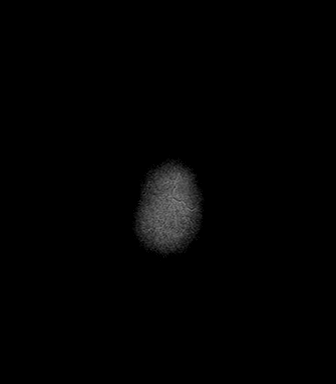

[Series 8: FLAIR · axial · 5.0mm · 0.45mm/px · z∈[-54,+102]mm · 2 of 25 slices shown]
[im 1/25]
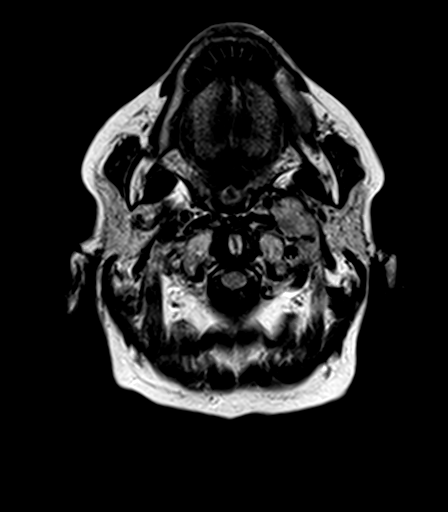
[im 25/25]
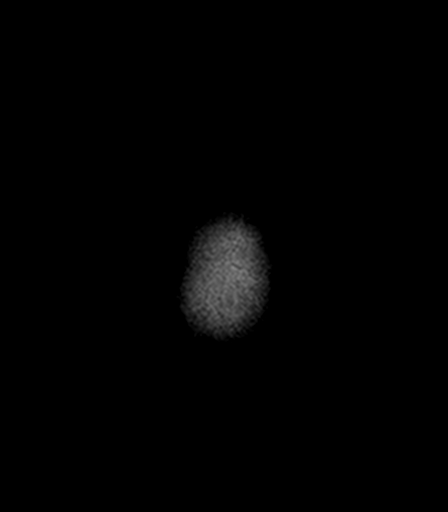

[Series 9: T2 · axial · 5.0mm · 0.45mm/px · z∈[-54,+102]mm · 2 of 25 slices shown (2 of 2)]
[im 1/25]
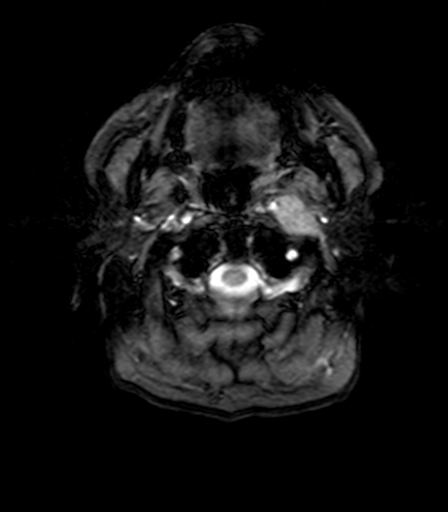
[im 25/25]
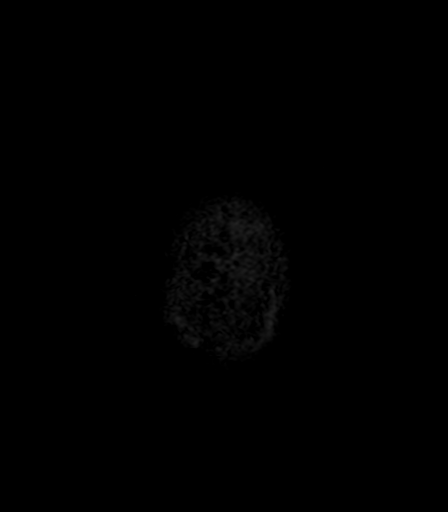

[Series 10: T1 · axial · 3.0mm · 1.00mm/px · z∈[-65,+112]mm · 6 of 60 slices shown (2 of 2)]
[im 1/60]
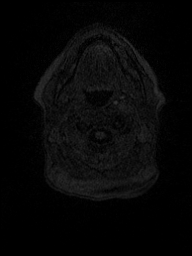
[im 12/60]
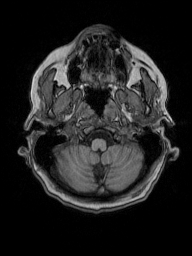
[im 24/60]
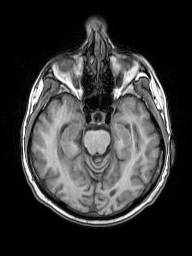
[im 36/60]
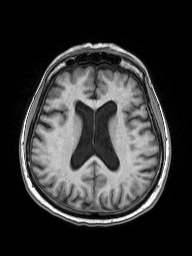
[im 48/60]
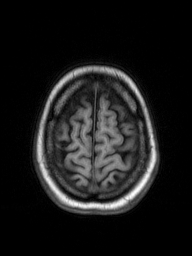
[im 60/60]
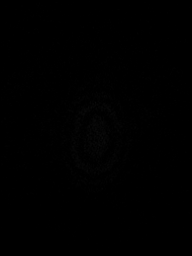

[Series 11: T2 post-contrast · coronal · 5.0mm · 0.49mm/px · 3 of 27 slices shown]
[im 1/27]
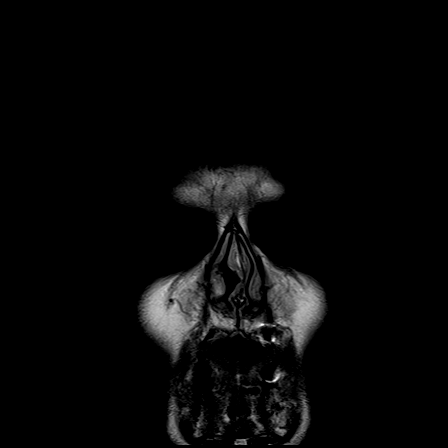
[im 14/27]
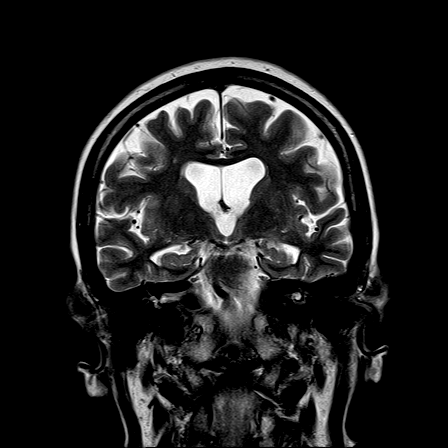
[im 27/27]
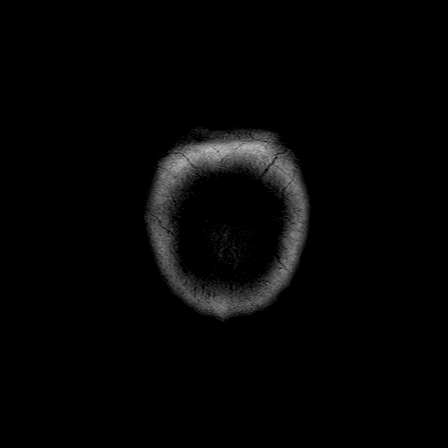

[Series 12: T1 post-contrast · axial · 3.0mm · 1.00mm/px · z∈[-65,+112]mm · 6 of 60 slices shown (1 of 3)]
[im 1/60]
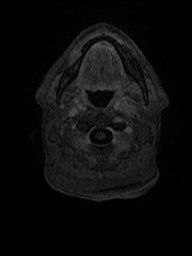
[im 12/60]
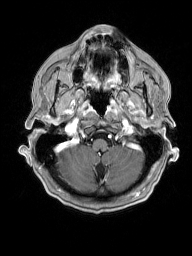
[im 24/60]
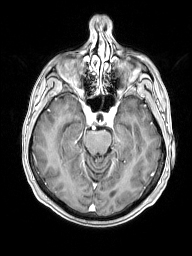
[im 36/60]
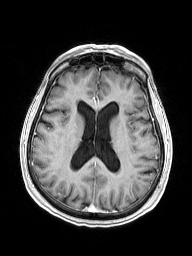
[im 48/60]
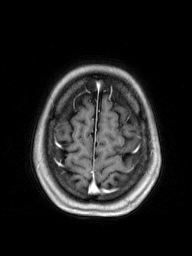
[im 60/60]
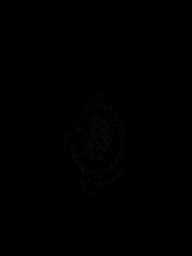

[Series 13: T1 post-contrast · coronal · 5.0mm · 0.43mm/px · 3 of 27 slices shown (2 of 3)]
[im 1/27]
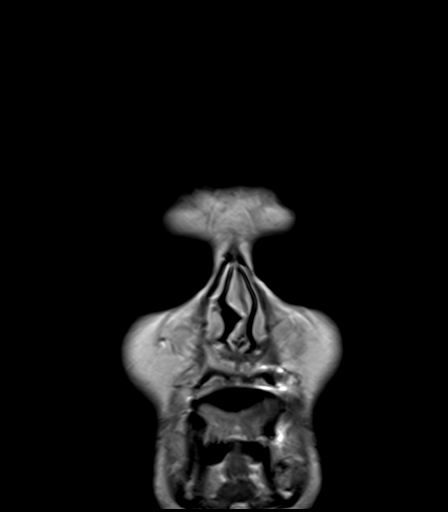
[im 14/27]
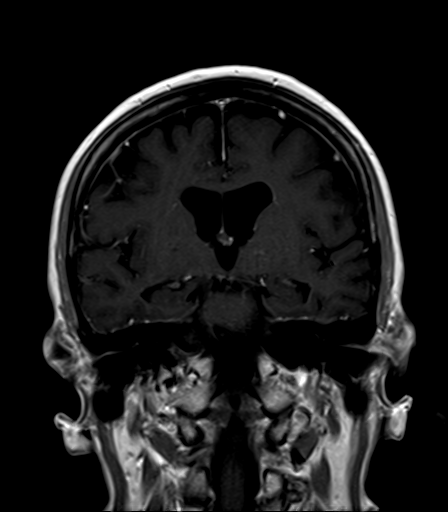
[im 27/27]
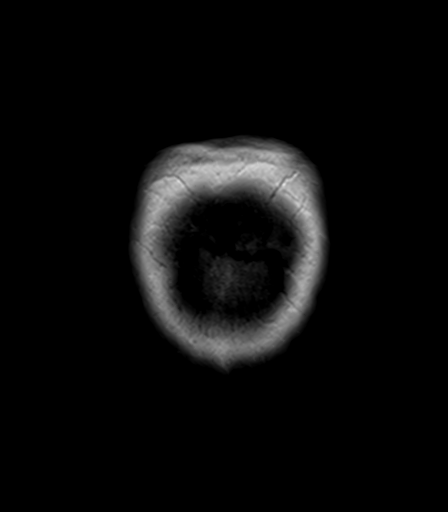

[Series 14: T1 post-contrast · sagittal · 5.0mm · 0.45mm/px · 3 of 27 slices shown (3 of 3)]
[im 1/27]
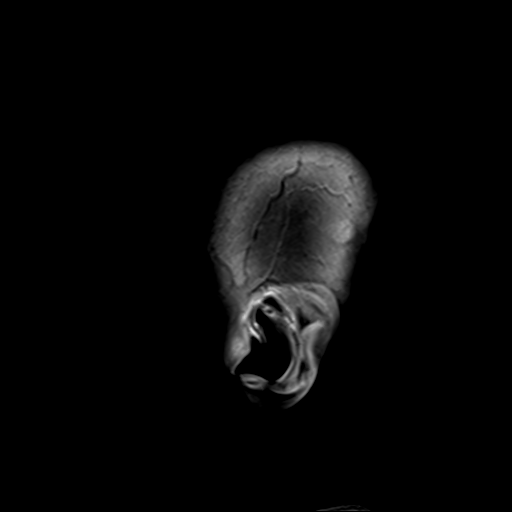
[im 14/27]
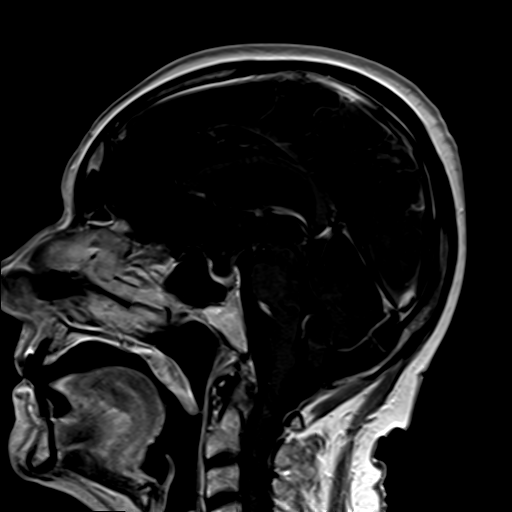
[im 27/27]
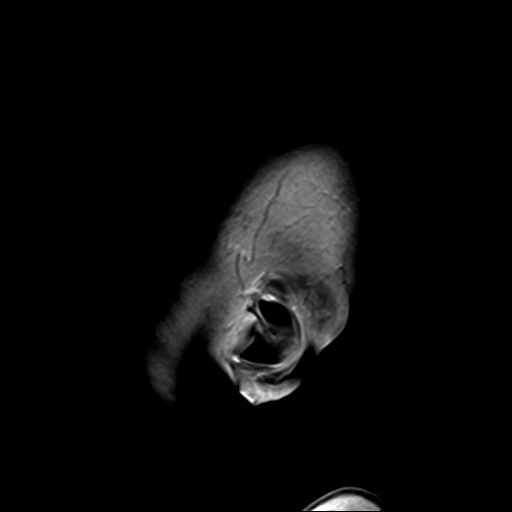

[Series 100: (id) · axial · 3.0mm · 1.80mm/px · z∈[-51,-12]mm · 2 of 55 slices shown]
[im 1/55]
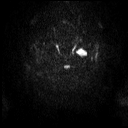
[im 14/55]
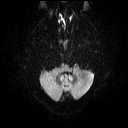

[41 of 48 positions shown; findings below may reference images not displayed]

FINDINGS: Brain: No evidence for acute infarction, hemorrhage, mass lesion,
hydrocephalus, or extra-axial fluid. Moderate atrophy. Mild
subcortical and periventricular T2 and FLAIR hyperintensities,
likely chronic microvascular ischemic change. No signs of vasogenic
edema.

Post infusion, no abnormal enhancement of the brain or meninges.

Vascular: Flow voids are maintained throughout the carotid, basilar,
and vertebral arteries. There are no areas of chronic hemorrhage.
LEFT vertebral dominant. Major dural venous sinuses are patent.

Skull and upper cervical spine: Unremarkable visualized calvarium,
skullbase, and cervical vertebrae. Pituitary, pineal, cerebellar
tonsils unremarkable. No upper cervical cord lesions.

Sinuses/Orbits: No orbital masses or proptosis. Globes appear
symmetric. Sinuses appear well aerated, without evidence for
air-fluid level.

Other: Approximate 12 x 23 mm cross-section LEFT lateral
retropharyngeal space mass, displacing the LEFT ICA medially, not
clearly arising from the parotid gland, favored to represent an
enlarged node of [place]. Other causes for neck mass in this
region, such as schwannoma, parotid tumor, or paraganglioma, appear
less likely, given the findings on PET scan.
IMPRESSION: Atrophy and small vessel disease. No acute intracranial findings or
intracranial metastatic disease.

Suspected metastatic disease to a greater than 1 x 2 cm
cross-section lateral retropharyngeal node on the LEFT. See
discussion above.

## 2016-12-07 ENCOUNTER — Emergency Department: Payer: BLUE CROSS/BLUE SHIELD

## 2016-12-07 ENCOUNTER — Other Ambulatory Visit: Payer: Self-pay

## 2016-12-07 ENCOUNTER — Inpatient Hospital Stay (HOSPITAL_BASED_OUTPATIENT_CLINIC_OR_DEPARTMENT_OTHER): Payer: BLUE CROSS/BLUE SHIELD | Admitting: Oncology

## 2016-12-07 ENCOUNTER — Inpatient Hospital Stay: Payer: BLUE CROSS/BLUE SHIELD

## 2016-12-07 ENCOUNTER — Encounter: Payer: Self-pay | Admitting: Emergency Medicine

## 2016-12-07 ENCOUNTER — Inpatient Hospital Stay
Admission: EM | Admit: 2016-12-07 | Discharge: 2016-12-10 | DRG: 189 | Disposition: A | Discharge status: Home / Self Care | Payer: BLUE CROSS/BLUE SHIELD | Attending: Internal Medicine | Admitting: Internal Medicine

## 2016-12-07 VITALS — BP 105/70 | HR 101 | Temp 98.2°F | Resp 28

## 2016-12-07 DIAGNOSIS — J9601 Acute respiratory failure with hypoxia: Secondary | ICD-10-CM | POA: Diagnosis not present

## 2016-12-07 DIAGNOSIS — J440 Chronic obstructive pulmonary disease with acute lower respiratory infection: Secondary | ICD-10-CM | POA: Diagnosis present

## 2016-12-07 DIAGNOSIS — R059 Cough, unspecified: Secondary | ICD-10-CM

## 2016-12-07 DIAGNOSIS — E860 Dehydration: Secondary | ICD-10-CM | POA: Diagnosis not present

## 2016-12-07 DIAGNOSIS — R21 Rash and other nonspecific skin eruption: Secondary | ICD-10-CM | POA: Diagnosis not present

## 2016-12-07 DIAGNOSIS — Z8249 Family history of ischemic heart disease and other diseases of the circulatory system: Secondary | ICD-10-CM

## 2016-12-07 DIAGNOSIS — R110 Nausea: Secondary | ICD-10-CM

## 2016-12-07 DIAGNOSIS — J441 Chronic obstructive pulmonary disease with (acute) exacerbation: Secondary | ICD-10-CM | POA: Diagnosis present

## 2016-12-07 DIAGNOSIS — R944 Abnormal results of kidney function studies: Secondary | ICD-10-CM | POA: Diagnosis not present

## 2016-12-07 DIAGNOSIS — D649 Anemia, unspecified: Secondary | ICD-10-CM

## 2016-12-07 DIAGNOSIS — E7800 Pure hypercholesterolemia, unspecified: Secondary | ICD-10-CM

## 2016-12-07 DIAGNOSIS — J9621 Acute and chronic respiratory failure with hypoxia: Principal | ICD-10-CM | POA: Diagnosis present

## 2016-12-07 DIAGNOSIS — D638 Anemia in other chronic diseases classified elsewhere: Secondary | ICD-10-CM | POA: Diagnosis present

## 2016-12-07 DIAGNOSIS — N179 Acute kidney failure, unspecified: Secondary | ICD-10-CM | POA: Diagnosis present

## 2016-12-07 DIAGNOSIS — D696 Thrombocytopenia, unspecified: Secondary | ICD-10-CM

## 2016-12-07 DIAGNOSIS — Z79899 Other long term (current) drug therapy: Secondary | ICD-10-CM

## 2016-12-07 DIAGNOSIS — R05 Cough: Secondary | ICD-10-CM

## 2016-12-07 DIAGNOSIS — D6959 Other secondary thrombocytopenia: Secondary | ICD-10-CM | POA: Diagnosis present

## 2016-12-07 DIAGNOSIS — Z85828 Personal history of other malignant neoplasm of skin: Secondary | ICD-10-CM | POA: Diagnosis not present

## 2016-12-07 DIAGNOSIS — E876 Hypokalemia: Secondary | ICD-10-CM | POA: Diagnosis not present

## 2016-12-07 DIAGNOSIS — R062 Wheezing: Secondary | ICD-10-CM | POA: Diagnosis not present

## 2016-12-07 DIAGNOSIS — E871 Hypo-osmolality and hyponatremia: Secondary | ICD-10-CM

## 2016-12-07 DIAGNOSIS — R0902 Hypoxemia: Secondary | ICD-10-CM | POA: Diagnosis not present

## 2016-12-07 DIAGNOSIS — J449 Chronic obstructive pulmonary disease, unspecified: Secondary | ICD-10-CM | POA: Diagnosis not present

## 2016-12-07 DIAGNOSIS — Z9221 Personal history of antineoplastic chemotherapy: Secondary | ICD-10-CM | POA: Diagnosis not present

## 2016-12-07 DIAGNOSIS — R590 Localized enlarged lymph nodes: Secondary | ICD-10-CM | POA: Diagnosis not present

## 2016-12-07 DIAGNOSIS — I959 Hypotension, unspecified: Secondary | ICD-10-CM | POA: Diagnosis present

## 2016-12-07 DIAGNOSIS — R748 Abnormal levels of other serum enzymes: Secondary | ICD-10-CM | POA: Diagnosis present

## 2016-12-07 DIAGNOSIS — Z85118 Personal history of other malignant neoplasm of bronchus and lung: Secondary | ICD-10-CM

## 2016-12-07 DIAGNOSIS — E039 Hypothyroidism, unspecified: Secondary | ICD-10-CM | POA: Diagnosis not present

## 2016-12-07 DIAGNOSIS — T451X5A Adverse effect of antineoplastic and immunosuppressive drugs, initial encounter: Secondary | ICD-10-CM | POA: Diagnosis present

## 2016-12-07 DIAGNOSIS — J189 Pneumonia, unspecified organism: Secondary | ICD-10-CM | POA: Diagnosis not present

## 2016-12-07 DIAGNOSIS — C3431 Malignant neoplasm of lower lobe, right bronchus or lung: Secondary | ICD-10-CM | POA: Diagnosis present

## 2016-12-07 DIAGNOSIS — N2889 Other specified disorders of kidney and ureter: Secondary | ICD-10-CM | POA: Diagnosis not present

## 2016-12-07 DIAGNOSIS — Z880 Allergy status to penicillin: Secondary | ICD-10-CM

## 2016-12-07 DIAGNOSIS — Y95 Nosocomial condition: Secondary | ICD-10-CM | POA: Diagnosis present

## 2016-12-07 DIAGNOSIS — E785 Hyperlipidemia, unspecified: Secondary | ICD-10-CM | POA: Diagnosis present

## 2016-12-07 DIAGNOSIS — Z87891 Personal history of nicotine dependence: Secondary | ICD-10-CM | POA: Diagnosis not present

## 2016-12-07 DIAGNOSIS — J439 Emphysema, unspecified: Secondary | ICD-10-CM | POA: Diagnosis not present

## 2016-12-07 DIAGNOSIS — I10 Essential (primary) hypertension: Secondary | ICD-10-CM

## 2016-12-07 DIAGNOSIS — R0989 Other specified symptoms and signs involving the circulatory and respiratory systems: Secondary | ICD-10-CM

## 2016-12-07 DIAGNOSIS — Z5111 Encounter for antineoplastic chemotherapy: Secondary | ICD-10-CM | POA: Diagnosis not present

## 2016-12-07 DIAGNOSIS — R5383 Other fatigue: Secondary | ICD-10-CM | POA: Diagnosis not present

## 2016-12-07 DIAGNOSIS — R531 Weakness: Secondary | ICD-10-CM | POA: Diagnosis not present

## 2016-12-07 DIAGNOSIS — K5900 Constipation, unspecified: Secondary | ICD-10-CM | POA: Diagnosis present

## 2016-12-07 DIAGNOSIS — C3490 Malignant neoplasm of unspecified part of unspecified bronchus or lung: Secondary | ICD-10-CM

## 2016-12-07 DIAGNOSIS — J3489 Other specified disorders of nose and nasal sinuses: Secondary | ICD-10-CM

## 2016-12-07 DIAGNOSIS — R634 Abnormal weight loss: Secondary | ICD-10-CM

## 2016-12-07 DIAGNOSIS — R000 Tachycardia, unspecified: Secondary | ICD-10-CM | POA: Diagnosis not present

## 2016-12-07 DIAGNOSIS — L509 Urticaria, unspecified: Secondary | ICD-10-CM | POA: Diagnosis not present

## 2016-12-07 LAB — COMPREHENSIVE METABOLIC PANEL
ALT: 24 U/L (ref 14–54)
ALT: 24 U/L (ref 14–54)
ANION GAP: 14 (ref 5–15)
AST: 35 U/L (ref 15–41)
AST: 33 U/L (ref 15–41)
Albumin: 2.8 g/dL — ABNORMAL LOW (ref 3.5–5.0)
Albumin: 2.8 g/dL — ABNORMAL LOW (ref 3.5–5.0)
Alkaline Phosphatase: 124 U/L (ref 38–126)
Alkaline Phosphatase: 127 U/L — ABNORMAL HIGH (ref 38–126)
Anion gap: 11 (ref 5–15)
BILIRUBIN TOTAL: 1 mg/dL (ref 0.3–1.2)
BUN: 22 mg/dL — ABNORMAL HIGH (ref 6–20)
BUN: 22 mg/dL — ABNORMAL HIGH (ref 6–20)
CHLORIDE: 99 mmol/L — AB (ref 101–111)
CO2: 24 mmol/L (ref 22–32)
CO2: 24 mmol/L (ref 22–32)
Calcium: 8.6 mg/dL — ABNORMAL LOW (ref 8.9–10.3)
Calcium: 8.6 mg/dL — ABNORMAL LOW (ref 8.9–10.3)
Chloride: 99 mmol/L — ABNORMAL LOW (ref 101–111)
Creatinine, Ser: 1.2 mg/dL — ABNORMAL HIGH (ref 0.44–1.00)
Creatinine, Ser: 1.17 mg/dL — ABNORMAL HIGH (ref 0.44–1.00)
GFR calc Af Amer: 51 mL/min — ABNORMAL LOW (ref >60)
GFR calc non Af Amer: 44 mL/min — ABNORMAL LOW (ref >60)
GFR, EST AFRICAN AMERICAN: 53 mL/min — AB (ref >60)
GFR, EST NON AFRICAN AMERICAN: 45 mL/min — AB (ref >60)
Glucose, Bld: 160 mg/dL — ABNORMAL HIGH (ref 65–99)
Glucose, Bld: 121 mg/dL — ABNORMAL HIGH (ref 65–99)
POTASSIUM: 4 mmol/L (ref 3.5–5.1)
Potassium: 3.9 mmol/L (ref 3.5–5.1)
Sodium: 134 mmol/L — ABNORMAL LOW (ref 135–145)
Sodium: 137 mmol/L (ref 135–145)
TOTAL PROTEIN: 6.8 g/dL (ref 6.5–8.1)
Total Bilirubin: 1 mg/dL (ref 0.3–1.2)
Total Protein: 6.9 g/dL (ref 6.5–8.1)

## 2016-12-07 LAB — INFLUENZA PANEL BY PCR (TYPE A & B)
INFLAPCR: NEGATIVE
INFLBPCR: NEGATIVE

## 2016-12-07 LAB — URINALYSIS, COMPLETE (UACMP) WITH MICROSCOPIC
Bacteria, UA: NONE SEEN
Bilirubin Urine: NEGATIVE
GLUCOSE, UA: NEGATIVE mg/dL
Hgb urine dipstick: NEGATIVE
Ketones, ur: NEGATIVE mg/dL
LEUKOCYTES UA: NEGATIVE
Nitrite: NEGATIVE
PH: 7 (ref 5.0–8.0)
Protein, ur: NEGATIVE mg/dL
SPECIFIC GRAVITY, URINE: 1.012 (ref 1.005–1.030)

## 2016-12-07 LAB — BRAIN NATRIURETIC PEPTIDE: B NATRIURETIC PEPTIDE 5: 181 pg/mL — AB (ref 0.0–100.0)

## 2016-12-07 LAB — CBC WITH DIFFERENTIAL/PLATELET
BASOS ABS: 0 10*3/uL (ref 0–0.1)
Basophils Absolute: 0 10*3/uL (ref 0–0.1)
Basophils Relative: 0 %
Basophils Relative: 0 %
EOS PCT: 0 %
Eosinophils Absolute: 0 10*3/uL (ref 0–0.7)
Eosinophils Absolute: 0 10*3/uL (ref 0–0.7)
Eosinophils Relative: 0 %
HCT: 31.9 % — ABNORMAL LOW (ref 35.0–47.0)
HCT: 32.1 % — ABNORMAL LOW (ref 35.0–47.0)
Hemoglobin: 10.4 g/dL — ABNORMAL LOW (ref 12.0–16.0)
Hemoglobin: 10.8 g/dL — ABNORMAL LOW (ref 12.0–16.0)
LYMPHS PCT: 9 %
Lymphocytes Relative: 8 %
Lymphs Abs: 0.9 10*3/uL — ABNORMAL LOW (ref 1.0–3.6)
Lymphs Abs: 0.9 10*3/uL — ABNORMAL LOW (ref 1.0–3.6)
MCH: 31 pg (ref 26.0–34.0)
MCH: 31.5 pg (ref 26.0–34.0)
MCHC: 32.8 g/dL (ref 32.0–36.0)
MCHC: 33.5 g/dL (ref 32.0–36.0)
MCV: 94.5 fL (ref 80.0–100.0)
MCV: 94 fL (ref 80.0–100.0)
MONO ABS: 0.9 10*3/uL (ref 0.2–0.9)
MONOS PCT: 9 %
Monocytes Absolute: 1.2 10*3/uL — ABNORMAL HIGH (ref 0.2–0.9)
Monocytes Relative: 9 %
Neutro Abs: 10.2 10*3/uL — ABNORMAL HIGH (ref 1.4–6.5)
Neutro Abs: 8.3 10*3/uL — ABNORMAL HIGH (ref 1.4–6.5)
Neutrophils Relative %: 83 %
Neutrophils Relative %: 82 %
PLATELETS: 78 10*3/uL — AB (ref 150–440)
Platelets: 87 10*3/uL — ABNORMAL LOW (ref 150–440)
RBC: 3.37 MIL/uL — ABNORMAL LOW (ref 3.80–5.20)
RBC: 3.42 MIL/uL — ABNORMAL LOW (ref 3.80–5.20)
RDW: 15.4 % — ABNORMAL HIGH (ref 11.5–14.5)
RDW: 15.1 % — AB (ref 11.5–14.5)
WBC: 12.3 10*3/uL — ABNORMAL HIGH (ref 3.6–11.0)
WBC: 10.2 10*3/uL (ref 3.6–11.0)

## 2016-12-07 LAB — PROTIME-INR
INR: 1.1
PROTHROMBIN TIME: 14.1 s (ref 11.4–15.2)

## 2016-12-07 LAB — TROPONIN I: Troponin I: 0.06 ng/mL (ref <0.03)

## 2016-12-07 LAB — APTT: APTT: 33 s (ref 24–36)

## 2016-12-07 LAB — LACTIC ACID, PLASMA: LACTIC ACID, VENOUS: 1.1 mmol/L (ref 0.5–1.9)

## 2016-12-07 MED ORDER — SODIUM CHLORIDE 0.9 % IV BOLUS (SEPSIS)
1000.0000 mL | Freq: Once | INTRAVENOUS | Status: AC
  Administered 2016-12-07: 1000 mL via INTRAVENOUS

## 2016-12-07 MED ORDER — SODIUM CHLORIDE 0.9 % IV BOLUS (SEPSIS)
500.0000 mL | Freq: Once | INTRAVENOUS | Status: AC
  Administered 2016-12-07: 500 mL via INTRAVENOUS

## 2016-12-07 MED ORDER — BENZONATATE 100 MG PO CAPS
200.0000 mg | ORAL_CAPSULE | Freq: Three times a day (TID) | ORAL | Status: DC | PRN
  Administered 2016-12-08 (×2): 200 mg via ORAL
  Filled 2016-12-07 (×2): qty 2

## 2016-12-07 MED ORDER — AZTREONAM 2 G IJ SOLR
2.0000 g | Freq: Three times a day (TID) | INTRAMUSCULAR | Status: DC
  Administered 2016-12-07 – 2016-12-10 (×8): 2 g via INTRAVENOUS
  Filled 2016-12-07 (×10): qty 2

## 2016-12-07 MED ORDER — SODIUM BICARBONATE 650 MG PO TABS
1300.0000 mg | ORAL_TABLET | Freq: Every day | ORAL | Status: DC
  Administered 2016-12-08 – 2016-12-10 (×3): 1300 mg via ORAL
  Filled 2016-12-07 (×4): qty 2

## 2016-12-07 MED ORDER — ACETAMINOPHEN 325 MG PO TABS: 650.0000 mg | ORAL_TABLET | Freq: Four times a day (QID) | ORAL | Status: DC | PRN

## 2016-12-07 MED ORDER — VANCOMYCIN HCL IN DEXTROSE 750-5 MG/150ML-% IV SOLN
750.0000 mg | INTRAVENOUS | Status: DC
  Filled 2016-12-07: qty 150

## 2016-12-07 MED ORDER — ENOXAPARIN SODIUM 40 MG/0.4ML ~~LOC~~ SOLN
40.0000 mg | SUBCUTANEOUS | Status: DC
  Administered 2016-12-08 – 2016-12-09 (×2): 40 mg via SUBCUTANEOUS
  Filled 2016-12-07 (×2): qty 0.4

## 2016-12-07 MED ORDER — ONDANSETRON HCL 4 MG PO TABS: 4.0000 mg | ORAL_TABLET | Freq: Four times a day (QID) | ORAL | Status: DC | PRN

## 2016-12-07 MED ORDER — IOPAMIDOL (ISOVUE-300) INJECTION 61%
60.0000 mL | Freq: Once | INTRAVENOUS | Status: AC | PRN
  Administered 2016-12-07: 60 mL via INTRAVENOUS

## 2016-12-07 MED ORDER — IPRATROPIUM-ALBUTEROL 0.5-2.5 (3) MG/3ML IN SOLN
3.0000 mL | Freq: Four times a day (QID) | RESPIRATORY_TRACT | Status: DC
  Administered 2016-12-07 – 2016-12-08 (×4): 3 mL via RESPIRATORY_TRACT
  Filled 2016-12-07 (×4): qty 3

## 2016-12-07 MED ORDER — HEPARIN SOD (PORK) LOCK FLUSH 100 UNIT/ML IV SOLN: 500.0000 [IU] | Freq: Once | INTRAVENOUS | Status: AC

## 2016-12-07 MED ORDER — SODIUM CHLORIDE 0.9 % IV SOLN
INTRAVENOUS | Status: AC
  Administered 2016-12-07 – 2016-12-08 (×2): via INTRAVENOUS

## 2016-12-07 MED ORDER — ALBUTEROL SULFATE (2.5 MG/3ML) 0.083% IN NEBU: 2.5000 mg | INHALATION_SOLUTION | RESPIRATORY_TRACT | Status: DC | PRN

## 2016-12-07 MED ORDER — ATORVASTATIN CALCIUM 20 MG PO TABS
20.0000 mg | ORAL_TABLET | Freq: Every day | ORAL | Status: DC
  Administered 2016-12-08 – 2016-12-10 (×3): 20 mg via ORAL
  Filled 2016-12-07 (×3): qty 1

## 2016-12-07 MED ORDER — VANCOMYCIN HCL IN DEXTROSE 750-5 MG/150ML-% IV SOLN
750.0000 mg | INTRAVENOUS | Status: DC
  Administered 2016-12-08: 05:00:00 750 mg via INTRAVENOUS
  Filled 2016-12-07 (×2): qty 150

## 2016-12-07 MED ORDER — MAGNESIUM OXIDE 400 (241.3 MG) MG PO TABS
400.0000 mg | ORAL_TABLET | Freq: Every day | ORAL | Status: DC
  Administered 2016-12-08 – 2016-12-10 (×3): 400 mg via ORAL
  Filled 2016-12-07 (×3): qty 1

## 2016-12-07 MED ORDER — VANCOMYCIN HCL IN DEXTROSE 750-5 MG/150ML-% IV SOLN
750.0000 mg | Freq: Once | INTRAVENOUS | Status: AC
  Administered 2016-12-07: 750 mg via INTRAVENOUS
  Filled 2016-12-07: qty 150

## 2016-12-07 MED ORDER — LEVOFLOXACIN IN D5W 750 MG/150ML IV SOLN
750.0000 mg | Freq: Once | INTRAVENOUS | Status: AC
Start: 2016-12-07 — End: 2016-12-07
  Administered 2016-12-07: 750 mg via INTRAVENOUS
  Filled 2016-12-07: qty 150

## 2016-12-07 NOTE — Progress Notes (Signed)
Dr Verdell Carmine notified of troponin 0.06. MD acknowledged, no new orders.

## 2016-12-07 NOTE — Progress Notes (Unsigned)
Pt on schedule for gemzar. Platelets 87, no treatment today. Upon bringing patient back to deaccess port, pt states she's very tired and having trouble breathing. VS taken, HR 101 and sats 68% on room air. Sats improved to 94% after 4L O2 applied via . Dr Mike Gip and Honor Loh NP at chairside to assess patient. Per Dr Mike Gip pt needs to be admitted. Will continue to monitor

## 2016-12-07 NOTE — Progress Notes (Signed)
ANTIBIOTIC CONSULT NOTE - INITIAL  Pharmacy Consult for Vancomycin  Indication: pneumonia  Allergies  Allergen Reactions  . Augmentin [Amoxicillin-Pot Clavulanate] Hives  . Penicillins Hives    Has patient had a PCN reaction causing immediate rash, facial/tongue/throat swelling, SOB or lightheadedness with hypotension: No Has patient had a PCN reaction causing severe rash involving mucus membranes or skin necrosis: No Has patient had a PCN reaction that required hospitalization No Has patient had a PCN reaction occurring within the last 10 years: Yes If all of the above answers are "NO", then may proceed with Cephalosporin use.      Patient Measurements: Height: 5\' 4"  (162.6 cm) Weight: 140 lb (63.5 kg) IBW/kg (Calculated) : 54.7 Adjusted Body Weight: 58.2 kg   Vital Signs: Temp: 97.5 F (36.4 C) (11/09 1815) Temp Source: Oral (11/09 1815) BP: 116/61 (11/09 1815) Pulse Rate: 90 (11/09 1815) Intake/Output from previous day: No intake/output data recorded. Intake/Output from this shift: Total I/O In: 1000 [IV Piggyback:1000] Out: -   Labs: Recent Labs    12/07/16 1308 12/07/16 1528  WBC 12.3* 10.2  HGB 10.4* 10.8*  PLT 87* 78*  CREATININE 1.20* 1.17*   Estimated Creatinine Clearance: 37.5 mL/min (A) (by C-G formula based on SCr of 1.17 mg/dL (H)). No results for input(s): VANCOTROUGH, VANCOPEAK, VANCORANDOM, GENTTROUGH, GENTPEAK, GENTRANDOM, TOBRATROUGH, TOBRAPEAK, TOBRARND, AMIKACINPEAK, AMIKACINTROU, AMIKACIN in the last 72 hours.   Microbiology: No results found for this or any previous visit (from the past 720 hour(s)).  Medical History: Past Medical History:  Diagnosis Date  . Cancer ([place])    basal cell   . Emphysema of lung ([place])   . Hypercholesteremia   . Hypertension   . Lung mass   . Renal disorder    renal insufficiency    Medications:  Medications Prior to Admission  Medication Sig Dispense Refill Last Dose  . amLODipine (NORVASC) 10 MG  tablet Take 10 mg by mouth daily.   12/07/2016 at Unknown time  . atorvastatin (LIPITOR) 20 MG tablet Take 20 mg daily by mouth.  3 12/07/2016 at Unknown time  . benzonatate (TESSALON) 200 MG capsule Take 1 capsule (200 mg total) 3 (three) times daily as needed by mouth for cough. 20 capsule 0 Past Week at Unknown time  . hydrALAZINE (APRESOLINE) 50 MG tablet Take 50 mg by mouth 3 (three) times daily.   12/07/2016 at Unknown time  . KLOR-CON 10 10 MEQ tablet TAKE 1 TABLET (10 MEQ TOTAL) BY MOUTH 2 (TWO) TIMES DAILY. 60 tablet 1 12/07/2016 at Unknown time  . magnesium oxide (MAG-OX) 400 MG tablet TAKE 1 TABLET (400 MG TOTAL) BY MOUTH DAILY. 30 tablet 1 12/07/2016 at Unknown time  . metoprolol succinate (TOPROL-XL) 100 MG 24 hr tablet Take 100 mg by mouth daily. Take with or immediately following a meal.   12/07/2016 at Unknown time  . ondansetron (ZOFRAN) 8 MG tablet Take 1 tablet every 8 (eight) hours as needed by mouth.   Past Week at Unknown time  . sodium bicarbonate 650 MG tablet Take 1,300 mg by mouth daily.    Past Week at Unknown time   Assessment: CrCl = 37.5 ml/min Ke = 0.036 hr-1 T1/2 = 19.2 hrs Vd = 40.7 L   Goal of Therapy:  Vancomycin trough level 15-20 mcg/ml  Plan:  Expected duration 7 days with resolution of temperature and/or normalization of WBC   Vancomycin 750 mg IV X 1 ordered for 11/9 @ 19:00. Vancomycin 750 mg IV Q24H  ordered to start on 11/10 @ 0100, ~ 6 hrs after 1st dose (stacked dosing).  This pt will reach Css by 11/13 @ 19:00 Will draw 1st trough on 11/13 @ 00:30, which will be approaching Css.   Christina Sharp D 12/07/2016,6:51 PM

## 2016-12-07 NOTE — ED Provider Notes (Addendum)
[facility] Emergency Department Provider Note  ____________________________________________  Time seen: Approximately 3:49 PM  I have reviewed the triage vital signs and the nursing notes.   HISTORY  Chief Complaint Shortness of Breath    HPI Christina Sharp is a 71 y.o. female with a history of lung cancer currently on chemotherapy, and COPD, not on exogenous oxygen, sent to the emergency department by her oncologist for hypoxia and concern for pneumonia. The patient reports that for the last several months she has had a progressively worsening cough. Over the last 4 weeks, her cough has become "constant" and she occasionally has yellow phlegm. She has associated exertional dyspnea and decreased exercise tolerance. She has had some mild clear rhinorrhea without any sore throat or ear pain. The patient was treated for dehydration with intravenous fluids 4 days ago. The patient denies any fevers, chills, nausea, vomiting, abdominal pain. On arrival to the emergency department, the patient is satting 68% on room air; this improves to 91 or 92% with 4 L nasal cannula.   Past Medical History:  Diagnosis Date  . Cancer ([place])    basal cell   . Emphysema of lung ([place])   . Hypercholesteremia   . Hypertension   . Lung mass   . Renal disorder    renal insufficiency    Patient Active Problem List   Diagnosis Date Noted  . Goals of care, counseling/discussion 05/26/2016  . Low TSH level 04/06/2016  . Hypokalemia 03/16/2016  . Hypomagnesemia 03/16/2016  . Low serum adrenocorticotrophic hormone (ACTH) 02/14/2016  . Encounter for antineoplastic chemotherapy 02/04/2016  . Cancer of lower lobe of right lung [facility])     Past Surgical History:  Procedure Laterality Date  . BASAL CELL CARCINOMA EXCISION    . COLONOSCOPY      Current Outpatient Rx  . Order #: [id number] Class: Historical Med  . Order #: [id number] Class: Normal  . Order #: [id number] Class: Historical  Med  . Order #: [id number] Class: Normal  . Order #: [id number] Class: Normal  . Order #: [id number] Class: Historical Med  . Order #: [id number] Class: Historical Med    Allergies Augmentin [amoxicillin-pot clavulanate] and Penicillins  Family History  Problem Relation Age of Onset  . Alzheimer's disease Mother   . Heart disease Father   . Heart attack Brother     Social History Social History   Tobacco Use  . Smoking status: Former Smoker    Packs/day: 0.50    Years: 45.00    Pack years: 22.50    Types: Cigarettes    Last attempt to quit: 11/06/2015    Years since quitting: 1.0  . Smokeless tobacco: Never Used  . Tobacco comment: hasn't smoked since 11-06-15  Substance Use Topics  . Alcohol use: Yes    Comment: rare  . Drug use: No    Review of Systems Constitutional: No fever/chills. No lightheadedness or syncope. Positive decreased exercise tolerance. Eyes: No visual changes. No eye discharge. ENT: No sore throat. No ear pain. Positive congestion and clear rhinorrhea. Cardiovascular: Denies chest pain. Denies palpitations. Respiratory: Positive exertional shortness of breath.  Positive productive cough. Gastrointestinal: No abdominal pain.  No nausea, no vomiting.  No diarrhea.  No constipation. Genitourinary: Negative for dysuria. Musculoskeletal: Negative for back pain. No lower extremity swelling or calf pain. Skin: Negative for rash. Neurological: Negative for headaches. No focal numbness, tingling or weakness.     ____________________________________________   PHYSICAL EXAM:  VITAL SIGNS: ED Triage Vitals  Enc Vitals Group  BP --      Pulse Rate 12/07/16 1448 90     Resp 12/07/16 1448 (!) 25     Temp 12/07/16 1448 97.8 F (36.6 C)     Temp Source 12/07/16 1448 Oral     SpO2 12/07/16 1445 (!) 82 %     Weight 12/07/16 1450 140 lb (63.5 kg)     Height 12/07/16 1450 5\' 4"  (1.626 m)     Head Circumference --      Peak Flow --      Pain Score 12/07/16  1446 3     Pain Loc --      Pain Edu? --      Excl. in [place]? --     Constitutional: Alert and oriented.  Answers questions appropriately. Chronically ill appearing but nontoxic. Eyes: Conjunctivae are normal.  EOMI. No scleral icterus. No eye discharge. Head: Atraumatic. Nose: No congestion/rhinnorhea. Mouth/Throat: Mucous membranes are dry.  Neck: No stridor.  Supple.  No JVD. No meningismus. Cardiovascular: Normal rate, regular rhythm. No murmurs, rubs or gallops.  Respiratory: Mild tachypnea without accessory muscle use or retractions. The patient has rales in the right lung base and minimal end expiratory wheezing. She is satting 92% on 4 L nasal cannula and mentating well on my examination. She is able to speak in 4-5 word sentences comfortably. Gastrointestinal: Soft, nontender and nondistended.  No guarding or rebound.  No peritoneal signs. Musculoskeletal: No LE edema. No ttp in the calves or palpable cords.  Negative Homan's sign. Neurologic:  A&Ox3.  Speech is clear.  Face and smile are symmetric.  EOMI.  Moves all extremities well. Skin:  Skin is warm, dry and intact. No rash noted. Psychiatric: Mood and affect are normal. Speech and behavior are normal.  Normal judgement.  ____________________________________________   LABS (all labs ordered are listed, but only abnormal results are displayed)  Labs Reviewed  CULTURE, BLOOD (ROUTINE X 2)  CULTURE, BLOOD (ROUTINE X 2)  URINE CULTURE  RAPID INFLUENZA A&B ANTIGENS ([facility] ONLY)  LACTIC ACID, PLASMA  LACTIC ACID, PLASMA  COMPREHENSIVE METABOLIC PANEL  CBC WITH DIFFERENTIAL/PLATELET  URINALYSIS, COMPLETE (UACMP) WITH MICROSCOPIC  URINALYSIS, ROUTINE W REFLEX MICROSCOPIC  TROPONIN I  BRAIN NATRIURETIC PEPTIDE  PROTIME-INR  APTT  I-STAT VENOUS BLOOD GAS, ED   ____________________________________________  EKG  ED ECG REPORT I, Eula Listen, the attending physician, personally viewed and interpreted this  ECG.   Date: 12/07/2016  EKG Time: 1450  Rate: 89  Rhythm: normal sinus rhythm  Axis: leftward  Intervals:none  ST&T Change: Nonspecific T-wave inversion in V1. No STEMI.  ____________________________________________  RADIOLOGY  Dg Chest 2 View  Result Date: 12/07/2016 CLINICAL DATA:  Short of breath.  Lung cancer EXAM: CHEST  2 VIEW COMPARISON:  CT chest 07/24/2016, 02/09/2016. Chest x-ray 02/01/2016. PET 04/24/2016 FINDINGS: Progression of extensive bilateral lung opacities. The air bronchograms associated with these opacities bilaterally. No effusion or heart failure. Port-A-Cath tip in the SVC at the [facility] /RA junction. IMPRESSION: Extensive bilateral airspace disease. This has progressed from prior studies. In this patient with lung cancer, this may represent progression of metastatic disease. Pneumonia is possible particularly in the left upper lobe. Electronically Signed   By: Franchot Gallo M.D.   On: 12/07/2016 15:23    ____________________________________________   PROCEDURES  Procedure(s) performed: None  Procedures  Critical Care performed: Yes, see critical care note(s) ____________________________________________   INITIAL IMPRESSION / ASSESSMENT AND PLAN / ED COURSE  Pertinent labs & imaging  results that were available during my care of the patient were reviewed by me and considered in my medical decision making (see chart for details).  71 y.o. female with a history of lung cancer currently under treatment, COPD, presenting with progressively worsening shortness of breath, cough and clear rhinorrhea, and hypoxia. Overall, I am concerned about the patient's significant hypoxia on room air especially in a patient has not required exogenous oxygen before. There are multiple possibilities etiologies, including acute infection such as pneumonia, viral URI, influenza, or progressive worsening of her underlying lung cancer. PE is also on the differential, but slightly less  likely given her lack of chest pain, tachycardia, and no evidence of DVT. A cardiac cause for the patient's hypoxia is much as likely but will also get a BNP. The patient will be immediately and empirically treated for pneumonia with antibiotics, and admitted to the hospital for further evaluation and treatment.  ----------------------------------------- 4:00 PM on 12/07/2016 -----------------------------------------  The patient's chest x-ray does show some extensive bilateral airspace disease, although it is not clear whether this is worsening of their lung cancer versus pneumonia. I have ordered a CT scan for further evaluation.  At this time, the patient's oxygen saturations remained stable in the low 90s on exogenous O2, and she is feeling better since arriving.  CRITICAL CARE Performed by: Eula Listen   Total critical care time: 40 minutes  Critical care time was exclusive of separately billable procedures and treating other patients.  Critical care was necessary to treat or prevent imminent or life-threatening deterioration.  Critical care was time spent personally by me on the following activities: development of treatment plan with patient and/or surrogate as well as nursing, discussions with consultants, evaluation of patient's response to treatment, examination of patient, obtaining history from patient or surrogate, ordering and performing treatments and interventions, ordering and review of laboratory studies, ordering and review of radiographic studies, pulse oximetry and re-evaluation of patient's condition.   ____________________________________________  FINAL CLINICAL IMPRESSION(S) / ED DIAGNOSES  Final diagnoses:  Hypoxia  Cough  Chest congestion  Rhinorrhea         NEW MEDICATIONS STARTED DURING THIS VISIT:  This SmartLink is deprecated. Use AVSMEDLIST instead to display the medication list for a patient.    Eula Listen, MD 12/07/16  1559    Eula Listen, MD 12/07/16 1600

## 2016-12-07 NOTE — Progress Notes (Signed)
Symptom Management Consult note [facility]  Telephone:([phone] [phone]  Patient Care Team: Anthonette Legato, MD as PCP - General (Internal Medicine) Nestor Lewandowsky, MD as Referring Physician (Cardiothoracic Surgery) Flora Lipps, MD as Consulting Physician (Pulmonary Disease) [name], MD as Consulting Physician (Pulmonary Disease)   Name of the patient: Christina Sharp  [id number]  July 03, 1945   Date of visit: 12/07/16  Diagnosis- stage IV adenocarcinoma   Chief complaint/ Reason for visit- Worsening cough/weakness/fatigue   Heme/Onc history: Christina Sharp is a 71 y.o. 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  Bronchoscopy on 09/22/2015 revealed mild to moderate chronic bronchitis changes with no endobronchial lesions.  Brushings from the right lower lobe were negative for malignancy.  Repeat bronchoscopy on 11/22/2015 was performed.  An EBUS scope was unable to be passed in the RML.  Transbronchial needle aspirations of a lesion were performed in the medial segment of the right middle lobe.  Pathology revealed atypical cells.  CEA and LDH were normal on 11/23/2015.  She underwent video assisted thoracoscopy (VATS) on 12/14/2015.  On the undersurface of the right middle lobe there wasa palpable mass.  Pathology from the right middle lobe wedge resection revealed adenocarcinoma, lepidic (80%), papillary (10%), and solid (10%).  Pleural biopsy was negative for malignancy.  EGFR, ALK, ROS1, BRAF were negative.  PDL-1 was 5%.  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  Tumor was microsatellite stable.  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  There were no alterations in EGFR, ALK, BRAF, MET, ERBB2, and ROS1.  PET scan on 11/21/2015 revealed a 2.9 x 3 cm central right lower lobe lung  lesion (SUV 7.0).  Areas of surrounding more patchy right lower lobe opacity were also suspicious for metastasis or synchronous primaries. There were bilateral pulmonary nodules, including hypermetabolic right upper and right lower lobe pulmonary nodules suspicious for metastatic disease.  There was no thoracic nodal hypermetabolism.  There was an area of soft tissue fullness within the high left neck suspicious for an enlarged lateral retropharyngeal node (? skip nodal metastasis).  There was indeterminate right renal lesion.  Clinical stage is T3N0M1.  Head MRI on 12/31/2015 revealed atrophy and small vessel disease.  There were no acute intracranial findings or intracranial metastatic disease.  There was suspected metastatic disease to a 1.2 x 2.3 cmleft lateral retropharyngeal node which displaced the left ICA medially.  Neck, chest, abdomen, and pelvic CT scan on 02/09/2016 revealed a 1.3 x 2.6 cm lateral retropharyngeal lymph node (stable), increase in size of nodules in the lingula and LLL and persistent nodular consolidation in the RLL.  Mediastinal adenopathy was stable.  Pretreatment labs included a TSH 0.278 (low), free T4 1.0 (normal), and ACTH < 1.1 (low).  ACTH was inadvertantly drawn the morning after receiving Decadron.  Follow-up early am cortisol on 02/07/2016 was 20.3 (6.7-22.6).    She received 4 cycles of carboplatin, Alimta, and Keytruda (02/03/2016 - 04/06/2016).   PET scan on 04/24/2016 revealed new and enlarging bilateral pulmonary nodules compared to the prior exam, hypermetabolic, compatible with metastatic disease in the lungs. There was low-grade metabolic activity in mediastinal lymph nodes. There continues to be a nodular structure favoring a neck lymph node in the upper left station IIa region, mildly hypermetabolic, likely reflecting a metastatic lymph node. There was no evidence of  She received 3 cycles  of Taxotere (05/25/2016 - 07/06/2016).  She tolerated her  chemotherapy well.  Counts were good.  She enrolled on Lycera clinical trial of [phone] (ROR gamma agonist).  She received 28 day cycles x 2 (09/07/2016 - 11/01/2016) at [facility]. Clinical trial was discontinued on 11/02/2016 secondary to progression.  Chest, abdomen, and pelvic CT at [facility] on 11/02/2016 revealed interval increased size of multiple bilateral heterogeneous consolidative opcacities most c/w disease progresion.  There was stable mediastinal adenopathy.  There was a stable hyperattenuating lesion of the gallbladder fundus.  Interval history- Patient was last seen in symptom management on 12/05/2016 for severe weakness and lack of energy. She noticed the symptoms after her last cycle of gemcitabine last Friday. She complained of one episode of nausea but otherwise was okay. She felt dehydrated, had no appetite and was weak, forceing fluids and food. She complained of a continued cough and exertional shortness of breath but denied fever or chest pain. She appeared pale and tired. Her mucous membranes were dry but lungs were clear bilaterally. She was afebrile. She received 1 L normal saline, IV Zofran and IV Decadron. She was given a prescription for Tessalon Perles for cough and she was to return today for cycle 1 day 15 gemcitabine.  Today she presented for cycle 1 day 15 gemcitabine and complained of "feeling worse" then she did on Wednesday and "short of breath". She continued to lack an appetite and has eaten very little the past 2 days. Her partner states that last night she watched her sleep because her cough and breathing were "very noisy" which made her nervous. She has been extremely weak and has been sleeping a lot. Her cough is better with the use of Tessalon Perles but continues. Her cough is dry and is not producing any sputum. She denies a fever but states she has felt warm and cold for the past several days. She is short of breath but this is chronic for her. It is worse then usual  and no longer just exertional. She does not use oxygen at home.   ECOG FS:1 - Symptomatic but completely ambulatory  Review of systems- Review of Systems  Constitutional: Positive for fever, malaise/fatigue and weight loss. Negative for chills.  HENT: Negative.   Eyes: Negative.   Respiratory: Positive for cough, sputum production, shortness of breath and wheezing.   Cardiovascular: Negative for chest pain, orthopnea and leg swelling.  Gastrointestinal: Positive for nausea. Negative for abdominal pain, diarrhea and vomiting.  Genitourinary: Negative.   Musculoskeletal: Positive for myalgias. Negative for back pain.  Skin: Negative.   Neurological: Positive for weakness. Negative for dizziness and headaches.  Endo/Heme/Allergies: Negative.   Psychiatric/Behavioral: Negative.      Current treatment- Last treatment was Cycle 1 Day 8 Gemzar on 11/30/16.   Allergies  Allergen Reactions  . Augmentin [Amoxicillin-Pot Clavulanate] Hives  . Penicillins Hives    Has patient had a PCN reaction causing immediate rash, facial/tongue/throat swelling, SOB or lightheadedness with hypotension: No Has patient had a PCN reaction causing severe rash involving mucus membranes or skin necrosis: No Has patient had a PCN reaction that required hospitalization No Has patient had a PCN reaction occurring within the last 10 years: Yes If all of the above answers are "NO", then may proceed with Cephalosporin use.       Past Medical History:  Diagnosis Date  . Cancer ([place])    basal cell   . Emphysema of lung ([place])   . Hypercholesteremia   .  Hypertension   . Lung mass   . Renal disorder    renal insufficiency     Past Surgical History:  Procedure Laterality Date  . BASAL CELL CARCINOMA EXCISION    . COLONOSCOPY      Social History   Socioeconomic History  . Marital status: Single    Spouse name: Not on file  . Number of children: Not on file  . Years of education: Not on file  . Highest  education level: Not on file  Social Needs  . Financial resource strain: Not hard at all  . Food insecurity - worry: Never true  . Food insecurity - inability: Never true  . Transportation needs - medical: No  . Transportation needs - non-medical: No  Occupational History  . Not on file  Tobacco Use  . Smoking status: Former Smoker    Packs/day: 0.50    Years: 45.00    Pack years: 22.50    Types: Cigarettes    Last attempt to quit: 11/06/2015    Years since quitting: 1.0  . Smokeless tobacco: Never Used  . Tobacco comment: hasn't smoked since 11-06-15  Substance and Sexual Activity  . Alcohol use: Yes    Comment: rare  . Drug use: No  . Sexual activity: Not on file  Other Topics Concern  . Not on file  Social History Narrative  . Not on file    Family History  Problem Relation Age of Onset  . Alzheimer's disease Mother   . Heart disease Father   . Heart attack Brother     No current facility-administered medications for this visit.   Current Outpatient Medications:  .  albuterol (PROVENTIL HFA;VENTOLIN HFA) 108 (90 Base) MCG/ACT inhaler, Inhale 2 puffs every 6 (six) hours as needed into the lungs for wheezing or shortness of breath., Disp: 1 Inhaler, Rfl: 0 .  albuterol (PROVENTIL) (2.5 MG/3ML) 0.083% nebulizer solution, Take 3 mLs (2.5 mg total) every 4 (four) hours as needed by nebulization for wheezing or shortness of breath., Disp: 360 mL, Rfl: 0 .  feeding supplement, ENSURE ENLIVE, (ENSURE ENLIVE) LIQD, Take 237 mLs daily by mouth., Disp: 60 Bottle, Rfl: 0 .  [START ON 12/11/2016] levofloxacin (LEVAQUIN) 500 MG tablet, Take 1 tablet (500 mg total) daily by mouth., Disp: 4 tablet, Rfl: 0 .  [START ON 12/11/2016] mometasone-formoterol (DULERA) 100-5 MCG/ACT AERO, Inhale 2 puffs 2 (two) times daily into the lungs., Disp: 1 Inhaler, Rfl: 0 .  [START ON 12/11/2016] predniSONE (DELTASONE) 20 MG tablet, Take 2 tablets (40 mg total) daily with breakfast by mouth., Disp: 6  tablet, Rfl: 0  Facility-Administered Medications Ordered in Other Visits:  .  acetaminophen (TYLENOL) tablet 650 mg, 650 mg, Oral, Q6H PRN, Gouru, Aruna, MD .  albuterol (PROVENTIL HFA;VENTOLIN HFA) 108 (90 Base) MCG/ACT inhaler 2 puff, 2 puff, Inhalation, Q6H PRN, Wieting, Richard, MD .  albuterol (PROVENTIL) (2.5 MG/3ML) 0.083% nebulizer solution 2.5 mg, 2.5 mg, Nebulization, Q4H PRN, Gouru, Aruna, MD .  atorvastatin (LIPITOR) tablet 20 mg, 20 mg, Oral, Daily, Gouru, Aruna, MD, 20 mg at 12/10/16 0901 .  aztreonam (AZACTAM) 2 g in dextrose 5 % 50 mL IVPB, 2 g, Intravenous, Q8H, Gouru, Aruna, MD, Stopped at 12/10/16 0721 .  benzonatate (TESSALON) capsule 200 mg, 200 mg, Oral, TID PRN, Gouru, Aruna, MD, 200 mg at 12/08/16 0933 .  budesonide (PULMICORT) nebulizer solution 0.5 mg, 0.5 mg, Nebulization, BID, Leslye Peer, Richard, MD, 0.5 mg at 12/10/16 0808 .  enoxaparin (LOVENOX)  injection 40 mg, 40 mg, Subcutaneous, Q24H, Gouru, Aruna, MD, 40 mg at 12/09/16 2305 .  feeding supplement (ENSURE ENLIVE) (ENSURE ENLIVE) liquid 237 mL, 237 mL, Oral, Q24H, Wieting, Richard, MD, 237 mL at 12/09/16 2307 .  guaiFENesin (MUCINEX) 12 hr tablet 600 mg, 600 mg, Oral, BID, Wieting, Richard, MD, 600 mg at 12/10/16 0901 .  heparin lock flush 100 unit/mL, 500 Units, Intravenous, Once, Corcoran, Melissa C, MD .  ipratropium-albuterol (DUONEB) 0.5-2.5 (3) MG/3ML nebulizer solution 3 mL, 3 mL, Nebulization, TID, Gouru, Aruna, MD, 3 mL at 12/10/16 0808 .  [START ON 12/11/2016] levofloxacin (LEVAQUIN) tablet 500 mg, 500 mg, Oral, Daily, Wieting, Richard, MD .  magnesium oxide (MAG-OX) tablet 400 mg, 400 mg, Oral, Daily, Gouru, Aruna, MD, 400 mg at 12/10/16 0901 .  MEDLINE mouth rinse, 15 mL, Mouth Rinse, BID, Wieting, Richard, MD .  methylPREDNISolone sodium succinate (SOLU-MEDROL) 40 mg/mL injection 40 mg, 40 mg, Intravenous, Daily, Leslye Peer, Richard, MD, 40 mg at 12/10/16 0901 .  [START ON 12/11/2016] mometasone-formoterol  (DULERA) 100-5 MCG/ACT inhaler 2 puff, 2 puff, Inhalation, BID, Wieting, Richard, MD .  ondansetron (ZOFRAN) tablet 4 mg, 4 mg, Oral, Q6H PRN, Gouru, Aruna, MD .  polyethylene glycol (MIRALAX / GLYCOLAX) packet 17 g, 17 g, Oral, Daily, Leslye Peer, Richard, MD, 17 g at 12/10/16 0901 .  [START ON 12/11/2016] predniSONE (DELTASONE) tablet 40 mg, 40 mg, Oral, Q breakfast, Wieting, Richard, MD .  sodium bicarbonate tablet 1,300 mg, 1,300 mg, Oral, Daily, Gouru, Aruna, MD, 1,300 mg at 12/10/16 0901  Physical exam: There were no vitals filed for this visit. Physical Exam  Constitutional: She is oriented to person, place, and time. She appears dehydrated. She has a sickly appearance.  HENT:  Head: Normocephalic and atraumatic.  Eyes: Pupils are equal, round, and reactive to light.  Neck: Normal range of motion and full passive range of motion without pain. Neck supple.  Cardiovascular: Normal rate, regular rhythm, normal heart sounds, intact distal pulses and normal pulses.  Pulmonary/Chest: She has decreased breath sounds in the right upper field, the right lower field, the left upper field and the left lower field. She has wheezes in the right upper field, the right lower field, the left upper field and the left lower field.  Abdominal: Soft. Normal appearance and bowel sounds are normal.  Musculoskeletal: Normal range of motion.  Neurological: She is alert and oriented to person, place, and time. She displays weakness.  Skin: Skin is warm, dry and intact. There is pallor.     CMP Latest Ref Rng & Units 12/07/2016  Glucose 65 - 99 mg/dL 121(H)  BUN 6 - 20 mg/dL 22(H)  Creatinine 0.44 - 1.00 mg/dL 1.17(H)  Sodium 135 - 145 mmol/L 137  Potassium 3.5 - 5.1 mmol/L 4.0  Chloride 101 - 111 mmol/L 99(L)  CO2 22 - 32 mmol/L 24  Calcium 8.9 - 10.3 mg/dL 8.6(L)  Total Protein 6.5 - 8.1 g/dL 6.8  Total Bilirubin 0.3 - 1.2 mg/dL 1.0  Alkaline Phos 38 - 126 U/L 127(H)  AST 15 - 41 U/L 33  ALT 14 - 54 U/L  24   CBC Latest Ref Rng & Units 12/08/2016  WBC 3.6 - 11.0 K/uL 9.3  Hemoglobin 12.0 - 16.0 g/dL 8.7(L)  Hematocrit 35.0 - 47.0 % 26.6(L)  Platelets 150 - 440 K/uL 72(L)    No images are attached to the encounter.  Dg Chest 2 View  Result Date: 12/07/2016 CLINICAL DATA:  Short of breath.  Lung cancer EXAM: CHEST  2 VIEW COMPARISON:  CT chest 07/24/2016, 02/09/2016. Chest x-ray 02/01/2016. PET 04/24/2016 FINDINGS: Progression of extensive bilateral lung opacities. The air bronchograms associated with these opacities bilaterally. No effusion or heart failure. Port-A-Cath tip in the SVC at the [facility] /RA junction. IMPRESSION: Extensive bilateral airspace disease. This has progressed from prior studies. In this patient with lung cancer, this may represent progression of metastatic disease. Pneumonia is possible particularly in the left upper lobe. Electronically Signed   By: Franchot Gallo M.D.   On: 12/07/2016 15:23   Ct Chest W Contrast  Result Date: 12/07/2016 CLINICAL DATA:  Followup lung cancer. EXAM: CT CHEST WITH CONTRAST TECHNIQUE: Multidetector CT imaging of the chest was performed during intravenous contrast administration. CONTRAST:  57m ISOVUE-300 IOPAMIDOL (ISOVUE-300) INJECTION 61% COMPARISON:  07/24/2016 FINDINGS: Cardiovascular: The heart size appears normal. Aortic atherosclerosis identified. Calcification within the RCA, LAD and left circumflex coronary artery is identified. Mediastinum/Nodes: The trachea appears patent and is midline. Normal appearance of the esophagus. Mediastinal adenopathy is identified. Index left-sided pre-vascular node measures 1.3 cm, image 49 of series 2. Previously 0.8 cm. Index right paratracheal lymph node Measures 1.4 cm, image 36 of series 2. Previously 1.2 cm. Lower right paratracheal lymph node measures 1.4 cm, image 43 of series 2. Previously 1.0 cm. 1.6 cm sub- carinal lymph node is unchanged. Lungs/Pleura: Advanced smoking related changes are  identified throughout both lungs. There is no pleural effusion. Interval progression of bilateral confluent areas of masslike architectural distortion compatible with multifocal pulmonary adenocarcinoma. Significant worsening aeration to the left upper lobe. Index mass within the lingula measures 6.3 by 4.6 cm, image 78 of series 3. Previously this measured 1.8 by 3.6 cm. Left lower lobe peripheral subpleural mass measures 5.7 cm 5.7 x 3.6 cm, image 94 of series 3. Previously 1.7 x 2.0 cm. Posterior left lower lobe the lesion measures 3.2 by 4.0 cm, image 93 of series 3. Previously 0.8 by 0.8 cm. Large area of masslike consolidation and ground-glass attenuation in the left upper lobe Measures 6.2 x 12.8 cm. Previously index nodule in this area measured 1.1 x 1.1 cm. Index lesion within the posterior and lateral left upper lobe has solid, cystic and ground-glass components measuring 9.4 x 5.8 cm, image 58 of series 3. Previous 5.9 by 4.0 cm. Upper Abdomen: No acute abnormality. Musculoskeletal: No chest wall abnormality. No acute or significant osseous findings. IMPRESSION: 1. There is been significant interval worsening aeration of both lungs. 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 Findings are also compatible with progression of disease with superimposed multifocal bilateral infection. 2. Progression of mediastinal adenopathy. 3. Aortic Atherosclerosis (ICD10-I70.0) and Emphysema (ICD10-J43.9). 4. Multi vessel coronary artery calcifications. Electronically Signed   By: TKerby MoorsM.D.   On: 12/07/2016 16:29     Assessment and plan- Patient is a 71y.o. female who presents for Cycle 1 Day 15 of Gemzar with weakness, cough and congestion. On assessment she is pale, dry mucous membrane with a sickly appearance. She is hypotensive (90's/ 60's), tachcardic (HR 101), febrile (101) and visibly short of breath. She is hypoxic (68 % on RA). Placed on oxygen  4L (up to 94%). Bilateral lungs wheezy with rales. Labs indicate hyponatremia (134), thrombocytopenia (87),  leukocytosis (12.3), anemia (10.4) and elevated kidney function (BUN 22).   1. Per Dr. CMike Gippatient was not receiving her treatment today due to labs and was in infusion only to have her port de-accessed.  While over there she became extremely short of breath and was found to be severely hypoxic. She was placed on oxygen. It was agreed upon that the patient needed to be admitted to the hospital. Spoke with hospitalist and given the clinical presentation it was suggested to be triaged to the emergency room. Patient was taken to the emergency room to be evaluated.   Visit Diagnosis 1. Hypoxia   2. Cough   3. Dehydration   4. Weakness   5. Weight loss     Patient expressed understanding and was in agreement with this plan. She also understands that She can call clinic at any time with any questions, concerns, or complaints.   Greater than 50% was spent in counseling and coordination of care with this patient including but not limited to discussion of the relevant topics above (See A&P) including, but not limited to diagnosis and management of acute and chronic medical conditions.    Faythe Casa, AGNP-C [facility] [place]- [phone] Pager- [phone] 12/10/2016 10:34 AM

## 2016-12-07 NOTE — Progress Notes (Signed)
Anticoagulation monitoring(Lovenox):  71 yo female ordered Lovenox 30 mg Q24h  Filed Weights   12/07/16 1450  Weight: 140 lb (63.5 kg)   BMI    Lab Results  Component Value Date   CREATININE 1.17 (H) 12/07/2016   CREATININE 1.20 (H) 12/07/2016   CREATININE 1.11 (H) 12/04/2016   Estimated Creatinine Clearance: 37.5 mL/min (A) (by C-G formula based on SCr of 1.17 mg/dL (H)). Hemoglobin & Hematocrit     Component Value Date/Time   HGB 10.8 (L) 12/07/2016 1528   HCT 32.1 (L) 12/07/2016 1528     Per Protocol for Patient with estCrcl >30 ml/min and BMI < 40, will transition to Lovenox 40 mg Q24h.

## 2016-12-07 NOTE — ED Notes (Addendum)
RT called at this time to change order for Christina Sharp from [place] , order changed

## 2016-12-07 NOTE — H&P (Signed)
[facility] [place] NAME: Christina Sharp    MR#:  [id number]  DATE OF BIRTH:  01-23-1946  DATE OF ADMISSION:  12/07/2016  PRIMARY CARE PHYSICIAN: Anthonette Legato, MD   REQUESTING/REFERRING PHYSICIAN: Mariea Clonts  CHIEF COMPLAINT:  Shortness of breath  HISTORY OF PRESENT ILLNESS:  Christina Sharp  is a 71 y.o. female with a known history of stage IV lung cancer currently getting chemotherapy 3 out of 11 cycles, COPD, quit smoking last year, not on home oxygen has been feeling weak and tired for the past 4-5 days.  Progressively her situation is getting worse and cough has been getting worse.  He was given IV fluids at primary care physician's office 4 days ago but still she is not feeling good today during her follow-up visit at her oncologist office she was hypoxic at 68% on room air.  Patient is sent over to the emergency department her temperature was 101 and hypotensive.  Patient was requiring 4 L of oxygen via nasal cannula.  Chest x-ray has revealed worsening of cancer and superimposed pneumonia.  Hospitalist team was called to admit the patient.  PAST MEDICAL HISTORY:   Past Medical History:  Diagnosis Date  . Cancer ([place])    basal cell   . Emphysema of lung ([place])   . Hypercholesteremia   . Hypertension   . Lung mass   . Renal disorder    renal insufficiency    PAST SURGICAL HISTOIRY:   Past Surgical History:  Procedure Laterality Date  . BASAL CELL CARCINOMA EXCISION    . COLONOSCOPY      SOCIAL HISTORY:   Social History   Tobacco Use  . Smoking status: Former Smoker    Packs/day: 0.50    Years: 45.00    Pack years: 22.50    Types: Cigarettes    Last attempt to quit: 11/06/2015    Years since quitting: 1.0  . Smokeless tobacco: Never Used  . Tobacco comment: hasn't smoked since 11-06-15  Substance Use Topics  . Alcohol use: Yes    Comment: rare    FAMILY HISTORY:   Family History  Problem Relation Age of  Onset  . Alzheimer's disease Mother   . Heart disease Father   . Heart attack Brother     DRUG ALLERGIES:   Allergies  Allergen Reactions  . Augmentin [Amoxicillin-Pot Clavulanate] Hives  . Penicillins Hives    Has patient had a PCN reaction causing immediate rash, facial/tongue/throat swelling, SOB or lightheadedness with hypotension: No Has patient had a PCN reaction causing severe rash involving mucus membranes or skin necrosis: No Has patient had a PCN reaction that required hospitalization No Has patient had a PCN reaction occurring within the last 10 years: Yes If all of the above answers are "NO", then may proceed with Cephalosporin use.      REVIEW OF SYSTEMS:  CONSTITUTIONAL: Reporting fever, fatigue and weakness.  EYES: No blurred or double vision.  EARS, NOSE, AND THROAT: No tinnitus or ear pain.  RESPIRATORY: Reporting cough, shortness of breath, denies wheezing or hemoptysis.  CARDIOVASCULAR: No chest pain, orthopnea, edema.  GASTROINTESTINAL: No nausea, vomiting, diarrhea or abdominal pain.  GENITOURINARY: No dysuria, hematuria.  ENDOCRINE: No polyuria, nocturia,  HEMATOLOGY: No anemia, easy bruising or bleeding SKIN: No rash or lesion. MUSCULOSKELETAL: No joint pain or arthritis.   NEUROLOGIC: No tingling, numbness, weakness.  PSYCHIATRY: No anxiety or depression.   MEDICATIONS AT HOME:   Prior to  Admission medications   Medication Sig Start Date End Date Taking? Authorizing Provider  amLODipine (NORVASC) 10 MG tablet Take 10 mg by mouth daily.   Yes [provider]  atorvastatin (LIPITOR) 20 MG tablet Take 20 mg daily by mouth. 11/25/16  Yes [provider]  benzonatate (TESSALON) 200 MG capsule Take 1 capsule (200 mg total) 3 (three) times daily as needed by mouth for cough. 12/04/16  Yes Jacquelin Hawking, NP  hydrALAZINE (APRESOLINE) 50 MG tablet Take 50 mg by mouth 3 (three) times daily.   Yes [provider]  KLOR-CON 10 10  MEQ tablet TAKE 1 TABLET (10 MEQ TOTAL) BY MOUTH 2 (TWO) TIMES DAILY. 08/13/16  Yes Corcoran, Melissa C, MD  magnesium oxide (MAG-OX) 400 MG tablet TAKE 1 TABLET (400 MG TOTAL) BY MOUTH DAILY. 08/02/16  Yes Lloyd Huger, MD  metoprolol succinate (TOPROL-XL) 100 MG 24 hr tablet Take 100 mg by mouth daily. Take with or immediately following a meal.   Yes [provider]  ondansetron (ZOFRAN) 8 MG tablet Take 1 tablet every 8 (eight) hours as needed by mouth.   Yes [provider]  sodium bicarbonate 650 MG tablet Take 1,300 mg by mouth daily.    Yes [provider]      VITAL SIGNS:  Pulse 90, temperature 97.8 F (36.6 C), temperature source Oral, resp. rate (!) 25, height 5\' 4"  (1.626 m), weight 63.5 kg (140 lb), SpO2 93 %.  PHYSICAL EXAMINATION:  GENERAL:  71 y.o.-year-old patient lying in the bed with no acute distress.  EYES: Pupils equal, round, reactive to light and accommodation. No scleral icterus. Extraocular muscles intact.  HEENT: Head atraumatic, normocephalic. Oropharynx and nasopharynx clear.  NECK:  Supple, no jugular venous distention. No thyroid enlargement, no tenderness.  LUNGS: moderate rattling ,breath sounds bilaterally, no wheezing, rales,rhonchi , patient has bilateral coarse crepitation. No use of accessory muscles of respiration.  CARDIOVASCULAR: S1, S2 normal. No murmurs, rubs, or gallops.  Right anterior chest wall with port ABDOMEN: Soft, nontender, nondistended. Bowel sounds present. No organomegaly or mass.  EXTREMITIES: No pedal edema, cyanosis, or clubbing.  NEUROLOGIC: Cranial nerves II through XII are intact. Muscle strength 5/5 in all extremities. Sensation intact. Gait not checked.  PSYCHIATRIC: The patient is alert and oriented x 3.  SKIN: No obvious rash, lesion, or ulcer.   LABORATORY PANEL:   CBC Recent Labs  Lab 12/07/16 1528  WBC 10.2  HGB 10.8*  HCT 32.1*  PLT 78*    ------------------------------------------------------------------------------------------------------------------  Chemistries  Recent Labs  Lab 12/07/16 1528  NA 137  K 4.0  CL 99*  CO2 24  GLUCOSE 121*  BUN 22*  CREATININE 1.17*  CALCIUM 8.6*  AST 33  ALT 24  ALKPHOS 127*  BILITOT 1.0   ------------------------------------------------------------------------------------------------------------------  Cardiac Enzymes No results for input(s): TROPONINI in the last 168 hours. ------------------------------------------------------------------------------------------------------------------  RADIOLOGY:  Dg Chest 2 View  Result Date: 12/07/2016 CLINICAL DATA:  Short of breath.  Lung cancer EXAM: CHEST  2 VIEW COMPARISON:  CT chest 07/24/2016, 02/09/2016. Chest x-ray 02/01/2016. PET 04/24/2016 FINDINGS: Progression of extensive bilateral lung opacities. The air bronchograms associated with these opacities bilaterally. No effusion or heart failure. Port-A-Cath tip in the SVC at the [facility] /RA junction. IMPRESSION: Extensive bilateral airspace disease. This has progressed from prior studies. In this patient with lung cancer, this may represent progression of metastatic disease. Pneumonia is possible particularly in the left upper lobe. Electronically Signed   By:  Franchot Gallo M.D.   On: 12/07/2016 15:23   Ct Chest W Contrast  Result Date: 12/07/2016 CLINICAL DATA:  Followup lung cancer. EXAM: CT CHEST WITH CONTRAST TECHNIQUE: Multidetector CT imaging of the chest was performed during intravenous contrast administration. CONTRAST:  73mL ISOVUE-300 IOPAMIDOL (ISOVUE-300) INJECTION 61% COMPARISON:  07/24/2016 FINDINGS: Cardiovascular: The heart size appears normal. Aortic atherosclerosis identified. Calcification within the RCA, LAD and left circumflex coronary artery is identified. Mediastinum/Nodes: The trachea appears patent and is midline. Normal appearance of the esophagus. Mediastinal  adenopathy is identified. Index left-sided pre-vascular node measures 1.3 cm, image 49 of series 2. Previously 0.8 cm. Index right paratracheal lymph node Measures 1.4 cm, image 36 of series 2. Previously 1.2 cm. Lower right paratracheal lymph node measures 1.4 cm, image 43 of series 2. Previously 1.0 cm. 1.6 cm sub- carinal lymph node is unchanged. Lungs/Pleura: Advanced smoking related changes are identified throughout both lungs. There is no pleural effusion. Interval progression of bilateral confluent areas of masslike architectural distortion compatible with multifocal pulmonary adenocarcinoma. Significant worsening aeration to the left upper lobe. Index mass within the lingula measures 6.3 by 4.6 cm, image 78 of series 3. Previously this measured 1.8 by 3.6 cm. Left lower lobe peripheral subpleural mass measures 5.7 cm 5.7 x 3.6 cm, image 94 of series 3. Previously 1.7 x 2.0 cm. Posterior left lower lobe the lesion measures 3.2 by 4.0 cm, image 93 of series 3. Previously 0.8 by 0.8 cm. Large area of masslike consolidation and ground-glass attenuation in the left upper lobe Measures 6.2 x 12.8 cm. Previously index nodule in this area measured 1.1 x 1.1 cm. Index lesion within the posterior and lateral left upper lobe has solid, cystic and ground-glass components measuring 9.4 x 5.8 cm, image 58 of series 3. Previous 5.9 by 4.0 cm. Upper Abdomen: No acute abnormality. Musculoskeletal: No chest wall abnormality. No acute or significant osseous findings. IMPRESSION: 1. There is been significant interval worsening aeration of both lungs. 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 Findings are also compatible with progression of disease with superimposed multifocal bilateral infection. 2. Progression of mediastinal adenopathy. 3. Aortic Atherosclerosis (ICD10-I70.0) and Emphysema (ICD10-J43.9). 4. Multi vessel coronary artery calcifications. Electronically  Signed   By: Kerby Moors M.D.   On: 12/07/2016 16:29    EKG:   Orders placed or performed during the hospital encounter of 12/07/16  . ED EKG 12-Lead  . ED EKG 12-Lead    IMPRESSION AND PLAN:    Christina Sharp  is a 71 y.o. female with a known history of stage IV lung cancer currently getting chemotherapy 3 out of 11 cycles, COPD, quit smoking last year, not on home oxygen has been feeling weak and tired for the past 4-5 days.  Progressively her situation is getting worse and cough has been getting worse.  He was given IV fluids at primary care physician's office 4 days ago but still she is not feeling good today during her follow-up visit at her oncologist office she was hypoxic at 68% on room air   #Acute hypoxic respiratory failure secondary to healthcare associated pneumonia postobstructive pneumonia/and underlying worsening stage IV lung cancer Admit patient to MedSurg unit Continue oxygen via nasal cannula and wean off as tolerated Broad-spectrum IV antibiotics aztreonam and vancomycin as patient is allergic to penicillins will obtain sputum culture and sensitivity if patient can provide a sample With bronchial dilator therapy Antitussives  #Sepsis from pneumonia Patient meets septic criteria  at the time of admission with fever and hypotension Blood cultures are obtained we will get urine culture and sensitivity as patient is immunocompromised Treating for pneumonia with aztreonam and vancomycin   #Acute kidney injury from poor p.o. Intake Gentle hydration with IV fluids Avoid nephrotoxins and repeat   Chem-8 in a.m.   # stage IV lung cancer on chemotherapy Hold off on chemotherapy Consult Dr. Mike Gip  #Chronic hypomagnesemia Magnesium supplements    DVT prophylaxis with Lovenox subcu    All the records are reviewed and case discussed with ED provider. Management plans discussed with the patient, family and they are in agreement.  CODE STATUS: fc ,friend  Langley Gauss and her siblings HCPOA  TOTAL TIME TAKING CARE OF THIS PATIENT: 43  minutes.   Note: This dictation was prepared with Dragon dictation along with smaller phrase technology. Any transcriptional errors that result from this process are unintentional.  Nicholes Mango M.D on 12/07/2016 at 5:26 PM  Between 7am to 6pm - Pager - [phone]  After 6pm go to [email or web address] - password [email or web address] [place] [facility]  Office  [phone]  CC: Primary care physician; Anthonette Legato, MD

## 2016-12-08 ENCOUNTER — Other Ambulatory Visit: Payer: Self-pay

## 2016-12-08 DIAGNOSIS — C3431 Malignant neoplasm of lower lobe, right bronchus or lung: Secondary | ICD-10-CM

## 2016-12-08 DIAGNOSIS — J189 Pneumonia, unspecified organism: Secondary | ICD-10-CM

## 2016-12-08 DIAGNOSIS — D696 Thrombocytopenia, unspecified: Secondary | ICD-10-CM

## 2016-12-08 DIAGNOSIS — Z79899 Other long term (current) drug therapy: Secondary | ICD-10-CM

## 2016-12-08 DIAGNOSIS — E039 Hypothyroidism, unspecified: Secondary | ICD-10-CM

## 2016-12-08 DIAGNOSIS — E876 Hypokalemia: Secondary | ICD-10-CM

## 2016-12-08 DIAGNOSIS — R059 Cough, unspecified: Secondary | ICD-10-CM

## 2016-12-08 DIAGNOSIS — R0902 Hypoxemia: Secondary | ICD-10-CM

## 2016-12-08 DIAGNOSIS — I10 Essential (primary) hypertension: Secondary | ICD-10-CM

## 2016-12-08 DIAGNOSIS — Z9221 Personal history of antineoplastic chemotherapy: Secondary | ICD-10-CM

## 2016-12-08 DIAGNOSIS — Z87891 Personal history of nicotine dependence: Secondary | ICD-10-CM

## 2016-12-08 DIAGNOSIS — J439 Emphysema, unspecified: Secondary | ICD-10-CM

## 2016-12-08 DIAGNOSIS — J9601 Acute respiratory failure with hypoxia: Secondary | ICD-10-CM

## 2016-12-08 DIAGNOSIS — Z85828 Personal history of other malignant neoplasm of skin: Secondary | ICD-10-CM

## 2016-12-08 DIAGNOSIS — N2889 Other specified disorders of kidney and ureter: Secondary | ICD-10-CM

## 2016-12-08 DIAGNOSIS — R05 Cough: Secondary | ICD-10-CM

## 2016-12-08 LAB — CBC WITH DIFFERENTIAL/PLATELET
BASOS ABS: 0 10*3/uL (ref 0–0.1)
Basophils Relative: 0 %
EOS PCT: 0 %
Eosinophils Absolute: 0 10*3/uL (ref 0–0.7)
HEMATOCRIT: 26.6 % — AB (ref 35.0–47.0)
HEMOGLOBIN: 8.7 g/dL — AB (ref 12.0–16.0)
LYMPHS ABS: 0.8 10*3/uL — AB (ref 1.0–3.6)
LYMPHS PCT: 8 %
MCH: 30.8 pg (ref 26.0–34.0)
MCHC: 32.6 g/dL (ref 32.0–36.0)
MCV: 94.4 fL (ref 80.0–100.0)
Monocytes Absolute: 1 10*3/uL — ABNORMAL HIGH (ref 0.2–0.9)
Monocytes Relative: 10 %
NEUTROS ABS: 7.5 10*3/uL — AB (ref 1.4–6.5)
NEUTROS PCT: 82 %
PLATELETS: 72 10*3/uL — AB (ref 150–440)
RBC: 2.82 MIL/uL — AB (ref 3.80–5.20)
RDW: 15.6 % — ABNORMAL HIGH (ref 11.5–14.5)
WBC: 9.3 10*3/uL (ref 3.6–11.0)

## 2016-12-08 LAB — PROCALCITONIN: Procalcitonin: 0.3 ng/mL

## 2016-12-08 LAB — STREP PNEUMONIAE URINARY ANTIGEN: STREP PNEUMO URINARY ANTIGEN: NEGATIVE

## 2016-12-08 LAB — MRSA PCR SCREENING: MRSA by PCR: NEGATIVE

## 2016-12-08 MED ORDER — POLYETHYLENE GLYCOL 3350 17 G PO PACK
17.0000 g | PACK | Freq: Every day | ORAL | Status: DC
  Administered 2016-12-08 – 2016-12-10 (×3): 17 g via ORAL
  Filled 2016-12-08 (×3): qty 1

## 2016-12-08 MED ORDER — GUAIFENESIN ER 600 MG PO TB12
600.0000 mg | ORAL_TABLET | Freq: Two times a day (BID) | ORAL | Status: DC
  Administered 2016-12-08 – 2016-12-10 (×5): 600 mg via ORAL
  Filled 2016-12-08 (×5): qty 1

## 2016-12-08 MED ORDER — ORAL CARE MOUTH RINSE: 15.0000 mL | Freq: Two times a day (BID) | OROMUCOSAL | Status: DC

## 2016-12-08 MED ORDER — METHYLPREDNISOLONE SODIUM SUCC 40 MG IJ SOLR
40.0000 mg | Freq: Every day | INTRAMUSCULAR | Status: DC
  Administered 2016-12-08 – 2016-12-10 (×3): 40 mg via INTRAVENOUS
  Filled 2016-12-08 (×3): qty 1

## 2016-12-08 MED ORDER — BUDESONIDE 0.5 MG/2ML IN SUSP
0.5000 mg | Freq: Two times a day (BID) | RESPIRATORY_TRACT | Status: DC
  Administered 2016-12-08 – 2016-12-10 (×5): 0.5 mg via RESPIRATORY_TRACT
  Filled 2016-12-08 (×5): qty 2

## 2016-12-08 NOTE — Consult Note (Signed)
Hematology/Oncology Consult note [facility] Telephone:([phone] [phone]  Patient Care Team: Anthonette Legato, MD as PCP - General (Internal Medicine) Nestor Lewandowsky, MD as Referring Physician (Cardiothoracic Surgery) Flora Lipps, MD as Consulting Physician (Pulmonary Disease) [name], MD as Consulting Physician (Pulmonary Disease)   Name of the patient: Christina Sharp  [id number]  [birth year]   Date of visit: 12/08/16 REASON FOR COSULTATION:  Stage IV lung cancer History of presenting illness-  Patient is a 71 year old  With no history of stage  Currently on gemcitabine chemotherapy treatment.presented to emergency ro for evaluation of  Progressive shortness of breath and weakness. Last chemotherapy was about 8 days ago. In the emergency room, patient was febrile with a temperature of 101 and hypotensive.chest x-ray reviewed worsening cancer and a superimposed pneumonia.started on antibiotics. Oncology was consulted to evaluate patient. Today patient reports f since admission. Her breathing is also better too. Appetite is  Fair. Denies any easy bruising or bleedi  Review of systems- Review of Systems  Constitutional: Positive for fever and malaise/fatigue. Negative for chills.  HENT: Negative for hearing loss.   Eyes: Negative for blurred vision.  Respiratory: Positive for cough and shortness of breath.   Cardiovascular: Negative for chest pain.  Gastrointestinal: Negative for heartburn.  Musculoskeletal: Negative for myalgias.  Skin: Negative for rash.  Neurological: Positive for weakness. Negative for dizziness.  Endo/Heme/Allergies: Does not bruise/bleed easily.  Psychiatric/Behavioral: Negative for depression.    Allergies  Allergen Reactions  . Augmentin [Amoxicillin-Pot Clavulanate] Hives  . Penicillins Hives    Has patient had a PCN reaction causing immediate rash, facial/tongue/throat swelling, SOB or lightheadedness with  hypotension: No Has patient had a PCN reaction causing severe rash involving mucus membranes or skin necrosis: No Has patient had a PCN reaction that required hospitalization No Has patient had a PCN reaction occurring within the last 10 years: Yes If all of the above answers are "NO", then may proceed with Cephalosporin use.      Patient Active Problem List   Diagnosis Date Noted  . PNA (pneumonia) 12/07/2016  . Goals of care, counseling/discussion 05/26/2016  . Low TSH level 04/06/2016  . Hypokalemia 03/16/2016  . Hypomagnesemia 03/16/2016  . Low serum adrenocorticotrophic hormone (ACTH) 02/14/2016  . Encounter for antineoplastic chemotherapy 02/04/2016  . Cancer of lower lobe of right lung [facility])      Past Medical History:  Diagnosis Date  . Cancer ([place])    basal cell   . Emphysema of lung ([place])   . Hypercholesteremia   . Hypertension   . Lung mass   . Renal disorder    renal insufficiency     Past Surgical History:  Procedure Laterality Date  . BASAL CELL CARCINOMA EXCISION    . COLONOSCOPY      Social History   Socioeconomic History  . Marital status: Single    Spouse name: Not on file  . Number of children: Not on file  . Years of education: Not on file  . Highest education level: Not on file  Social Needs  . Financial resource strain: Not hard at all  . Food insecurity - worry: Never true  . Food insecurity - inability: Never true  . Transportation needs - medical: No  . Transportation needs - non-medical: No  Occupational History  . Not on file  Tobacco Use  . Smoking status: Former Smoker    Packs/day: 0.50    Years: 45.00    Pack years: 22.50  Types: Cigarettes    Last attempt to quit: 11/06/2015    Years since quitting: 1.0  . Smokeless tobacco: Never Used  . Tobacco comment: hasn't smoked since 11-06-15  Substance and Sexual Activity  . Alcohol use: Yes    Comment: rare  . Drug use: No  . Sexual activity: Not on file  Other Topics  Concern  . Not on file  Social History Narrative  . Not on file     Family History  Problem Relation Age of Onset  . Alzheimer's disease Mother   . Heart disease Father   . Heart attack Brother      Current Facility-Administered Medications:  .  0.9 %  sodium chloride infusion, , Intravenous, Continuous, Wieting, Richard, MD, Last Rate: 40 mL/hr at 12/08/16 1044 .  acetaminophen (TYLENOL) tablet 650 mg, 650 mg, Oral, Q6H PRN, Gouru, Aruna, MD .  albuterol (PROVENTIL) (2.5 MG/3ML) 0.083% nebulizer solution 2.5 mg, 2.5 mg, Nebulization, Q4H PRN, Gouru, Aruna, MD .  atorvastatin (LIPITOR) tablet 20 mg, 20 mg, Oral, Daily, Gouru, Aruna, MD, 20 mg at 12/08/16 0933 .  aztreonam (AZACTAM) 2 g in dextrose 5 % 50 mL IVPB, 2 g, Intravenous, Q8H, Gouru, Aruna, MD, Stopped at 12/08/16 1426 .  benzonatate (TESSALON) capsule 200 mg, 200 mg, Oral, TID PRN, Gouru, Aruna, MD, 200 mg at 12/08/16 0933 .  budesonide (PULMICORT) nebulizer solution 0.5 mg, 0.5 mg, Nebulization, BID, Leslye Peer, Richard, MD, 0.5 mg at 12/08/16 1348 .  enoxaparin (LOVENOX) injection 40 mg, 40 mg, Subcutaneous, Q24H, Gouru, Aruna, MD .  guaiFENesin (MUCINEX) 12 hr tablet 600 mg, 600 mg, Oral, BID, Leslye Peer, Richard, MD, 600 mg at 12/08/16 1044 .  ipratropium-albuterol (DUONEB) 0.5-2.5 (3) MG/3ML nebulizer solution 3 mL, 3 mL, Nebulization, Q6H, Gouru, Aruna, MD, 3 mL at 12/08/16 1348 .  magnesium oxide (MAG-OX) tablet 400 mg, 400 mg, Oral, Daily, Gouru, Aruna, MD, 400 mg at 12/08/16 0933 .  methylPREDNISolone sodium succinate (SOLU-MEDROL) 40 mg/mL injection 40 mg, 40 mg, Intravenous, Daily, Leslye Peer, Richard, MD, 40 mg at 12/08/16 1047 .  ondansetron (ZOFRAN) tablet 4 mg, 4 mg, Oral, Q6H PRN, Gouru, Aruna, MD .  polyethylene glycol (MIRALAX / GLYCOLAX) packet 17 g, 17 g, Oral, Daily, Leslye Peer, Richard, MD, 17 g at 12/08/16 1044 .  sodium bicarbonate tablet 1,300 mg, 1,300 mg, Oral, Daily, Gouru, Aruna, MD, 1,300 mg at 12/08/16  0933 .  vancomycin (VANCOCIN) IVPB 750 mg/150 ml premix, 750 mg, Intravenous, Q24H, Gouru, Aruna, MD, Stopped at 12/08/16 0748  Facility-Administered Medications Ordered in Other Encounters:  .  heparin lock flush 100 unit/mL, 500 Units, Intravenous, Once, Lequita Asal, MD   Physical exam:  Vitals:   12/07/16 2035 12/07/16 2052 12/08/16 0723 12/08/16 1446  BP: (!) 99/59   (!) 101/59  Pulse: 86   (!) 102  Resp: 20     Temp: (!) 97.4 F (36.3 C)   97.6 F (36.4 C)  TempSrc: Oral   Oral  SpO2: 91% 93% 96% 94%  Weight:      Height:       GENERAL no acute distress.  EYES: Pupils equal, round, reactive to light and accommodation. No scleral icterus. Extraocular muscles intact.  HEENT: Head atraumatic, normocephalic. Oropharynx and nasopharynx clear.  NECK:  Supple, no jugular venous distention. No thyroid enlargement, no tenderness.  LUNGS: moderate rattling ,breath sounds bilaterally, no wheezing, rales,rhonchi , patient has bilateral coarse crepitation. No use of accessory muscles of respiration.  CARDIOVASCULAR: S1, S2 normal.  No murmurs, rubs, or gallops.  Right anterior chest wall with port ABDOMEN: Soft, nontender, nondistended. Bowel sounds present. No organomegaly or mass.  EXTREMITIES: No pedal edema, cyanosis, or clubbing.  NEUROLOGIC: Cranial nerves II through XII are intact. Muscle strength 5/5 in all extremities. Sensation intact. Gait not checked.  PSYCHIATRIC: The patient is alert and oriented x 3.  SKIN: No obvious rash, lesion, or ulcer.         CMP Latest Ref Rng & Units 12/07/2016  Glucose 65 - 99 mg/dL 121(H)  BUN 6 - 20 mg/dL 22(H)  Creatinine 0.44 - 1.00 mg/dL 1.17(H)  Sodium 135 - 145 mmol/L 137  Potassium 3.5 - 5.1 mmol/L 4.0  Chloride 101 - 111 mmol/L 99(L)  CO2 22 - 32 mmol/L 24  Calcium 8.9 - 10.3 mg/dL 8.6(L)  Total Protein 6.5 - 8.1 g/dL 6.8  Total Bilirubin 0.3 - 1.2 mg/dL 1.0  Alkaline Phos 38 - 126 U/L 127(H)  AST 15 - 41 U/L 33   ALT 14 - 54 U/L 24   CBC Latest Ref Rng & Units 12/08/2016  WBC 3.6 - 11.0 K/uL 9.3  Hemoglobin 12.0 - 16.0 g/dL 8.7(L)  Hematocrit 35.0 - 47.0 % 26.6(L)  Platelets 150 - 440 K/uL 72(L)    @IMAGES @  Dg Chest 2 View  Result Date: 12/07/2016 CLINICAL DATA:  Short of breath.  Lung cancer EXAM: CHEST  2 VIEW COMPARISON:  CT chest 07/24/2016, 02/09/2016. Chest x-ray 02/01/2016. PET 04/24/2016 FINDINGS: Progression of extensive bilateral lung opacities. The air bronchograms associated with these opacities bilaterally. No effusion or heart failure. Port-A-Cath tip in the SVC at the [facility] /RA junction. IMPRESSION: Extensive bilateral airspace disease. This has progressed from prior studies. In this patient with lung cancer, this may represent progression of metastatic disease. Pneumonia is possible particularly in the left upper lobe. Electronically Signed   By: Franchot Gallo M.D.   On: 12/07/2016 15:23   Ct Chest W Contrast  Result Date: 12/07/2016 CLINICAL DATA:  Followup lung cancer. EXAM: CT CHEST WITH CONTRAST TECHNIQUE: Multidetector CT imaging of the chest was performed during intravenous contrast administration. CONTRAST:  100mL ISOVUE-300 IOPAMIDOL (ISOVUE-300) INJECTION 61% COMPARISON:  07/24/2016 FINDINGS: Cardiovascular: The heart size appears normal. Aortic atherosclerosis identified. Calcification within the RCA, LAD and left circumflex coronary artery is identified. Mediastinum/Nodes: The trachea appears patent and is midline. Normal appearance of the esophagus. Mediastinal adenopathy is identified. Index left-sided pre-vascular node measures 1.3 cm, image 49 of series 2. Previously 0.8 cm. Index right paratracheal lymph node Measures 1.4 cm, image 36 of series 2. Previously 1.2 cm. Lower right paratracheal lymph node measures 1.4 cm, image 43 of series 2. Previously 1.0 cm. 1.6 cm sub- carinal lymph node is unchanged. Lungs/Pleura: Advanced smoking related changes are identified throughout  both lungs. There is no pleural effusion. Interval progression of bilateral confluent areas of masslike architectural distortion compatible with multifocal pulmonary adenocarcinoma. Significant worsening aeration to the left upper lobe. Index mass within the lingula measures 6.3 by 4.6 cm, image 78 of series 3. Previously this measured 1.8 by 3.6 cm. Left lower lobe peripheral subpleural mass measures 5.7 cm 5.7 x 3.6 cm, image 94 of series 3. Previously 1.7 x 2.0 cm. Posterior left lower lobe the lesion measures 3.2 by 4.0 cm, image 93 of series 3. Previously 0.8 by 0.8 cm. Large area of masslike consolidation and ground-glass attenuation in the left upper lobe Measures 6.2 x 12.8 cm. Previously index nodule in this area measured  1.1 x 1.1 cm. Index lesion within the posterior and lateral left upper lobe has solid, cystic and ground-glass components measuring 9.4 x 5.8 cm, image 58 of series 3. Previous 5.9 by 4.0 cm. Upper Abdomen: No acute abnormality. Musculoskeletal: No chest wall abnormality. No acute or significant osseous findings. IMPRESSION: 1. There is been significant interval worsening aeration of both lungs. 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 Findings are also compatible with progression of disease with superimposed multifocal bilateral infection. 2. Progression of mediastinal adenopathy. 3. Aortic Atherosclerosis (ICD10-I70.0) and Emphysema (ICD10-J43.9). 4. Multi vessel coronary artery calcifications. Electronically Signed   By: Kerby Moors M.D.   On: 12/07/2016 16:29    Assessment and plan-   # Acute hypoxic respiratory failure secondary to healthcare associated pneumonia continue antibiotics.  # Stage IV lung adenocarcinoma, hold chemotherapy. Last dose of gemcitabine 8 days ago. Outpatient follow up with Dr.Corcoran.  # Thrombocytopenia: secondary to chemotherapy. No active bleeding. Continue monitor. If platelet <50,000,  hold prophylactic Lovenox, continue Intermittent pneumatic compression     Thank you for this kind referral and the opportunity to participate in the care of this patient  Dr. Earlie Server, MD, PhD [facility] Pager- [phone] 12/08/2016

## 2016-12-08 NOTE — Progress Notes (Signed)
Discontinue droplet isolation order

## 2016-12-08 NOTE — Progress Notes (Signed)
Patient ID: Christina Sharp, female   DOB: [birth year], 71 y.o.   MRN: [id number]  [facility] PROGRESS NOTE  Christina Sharp JSH:[id number] DOB: [birth year] DOA: 12/07/2016 PCP: Anthonette Legato, MD  HPI/Subjective: Patient came in with shortness of breath.  She was found to be very hypoxic with a pulse ox in the 60s.  She is feeling better today with her breathing.  Objective: Vitals:   12/07/16 2052 12/08/16 0723  BP:    Pulse:    Resp:    Temp:    SpO2: 93% 96%    Intake/Output Summary (Last 24 hours) at 12/08/2016 1402 Last data filed at 12/08/2016 0758 Gross per 24 hour  Intake 1556.67 ml  Output -  Net 1556.67 ml   Filed Weights   12/07/16 1450  Weight: 63.5 kg (140 lb)    ROS: Review of Systems  Constitutional: Negative for chills and fever.  Eyes: Negative for blurred vision.  Respiratory: Positive for cough, shortness of breath and wheezing.   Cardiovascular: Negative for chest pain.  Gastrointestinal: Positive for constipation. Negative for abdominal pain, diarrhea, nausea and vomiting.  Genitourinary: Negative for dysuria.  Musculoskeletal: Negative for joint pain.  Neurological: Negative for dizziness and headaches.   Exam: Physical Exam  Constitutional: She is oriented to person, place, and time.  HENT:  Nose: No mucosal edema.  Mouth/Throat: No oropharyngeal exudate or posterior oropharyngeal edema.  Eyes: Conjunctivae, EOM and lids are normal. Pupils are equal, round, and reactive to light.  Neck: No JVD present. Carotid bruit is not present. No edema present. No thyroid mass and no thyromegaly present.  Cardiovascular: S1 normal and S2 normal. Exam reveals no gallop.  No murmur heard. Pulses:      Dorsalis pedis pulses are 2+ on the right side, and 2+ on the left side.  Respiratory: No respiratory distress. She has decreased breath sounds in the right middle field, the right lower field, the left middle field and the left lower field. She has  wheezes in the right middle field, the right lower field and the left lower field. She has no rhonchi. She has no rales.  GI: Soft. Bowel sounds are normal. There is no tenderness.  Musculoskeletal:       Right ankle: She exhibits swelling.       Left ankle: She exhibits swelling.  Lymphadenopathy:    She has no cervical adenopathy.  Neurological: She is alert and oriented to person, place, and time. No cranial nerve deficit.  Skin: Skin is warm. No rash noted. Nails show no clubbing.  Psychiatric: She has a normal mood and affect.      Data Reviewed: Basic Metabolic Panel: Recent Labs  Lab 12/04/16 1429 12/07/16 1308 12/07/16 1528  NA 129* 134* 137  K 4.1 3.9 4.0  CL 96* 99* 99*  CO2 22 24 24   GLUCOSE 115* 160* 121*  BUN 21* 22* 22*  CREATININE 1.11* 1.20* 1.17*  CALCIUM 8.7* 8.6* 8.6*   Liver Function Tests: Recent Labs  Lab 12/04/16 1429 12/07/16 1308 12/07/16 1528  AST 27 35 33  ALT 19 24 24   ALKPHOS 97 124 127*  BILITOT 0.7 1.0 1.0  PROT 6.9 6.9 6.8  ALBUMIN 3.2* 2.8* 2.8*   CBC: Recent Labs  Lab 12/04/16 1429 12/07/16 1308 12/07/16 1528 12/08/16 0506  WBC 6.8 12.3* 10.2 9.3  NEUTROABS 6.2 10.2* 8.3* 7.5*  HGB 11.0* 10.4* 10.8* 8.7*  HCT 33.8* 31.9* 32.1* 26.6*  MCV 94.7 94.5 94.0 94.4  PLT 127* 87* 78* 72*   Cardiac Enzymes: Recent Labs  Lab 12/07/16 1528  TROPONINI 0.06*   BNP (last 3 results) Recent Labs    12/07/16 1528  BNP 181.0*      Recent Results (from the past 240 hour(s))  Blood Culture (routine x 2)     Status: None (Preliminary result)   Collection Time: 12/07/16  3:28 PM  Result Value Ref Range Status   Specimen Description BLOOD BLOOD RIGHT FOREARM  Final   Special Requests   Final    BOTTLES DRAWN AEROBIC AND ANAEROBIC Blood Culture results may not be optimal due to an excessive volume of blood received in culture bottles   Culture NO GROWTH < 24 HOURS  Final   Report Status PENDING  Incomplete  Blood Culture  (routine x 2)     Status: None (Preliminary result)   Collection Time: 12/07/16  3:30 PM  Result Value Ref Range Status   Specimen Description BLOOD RIGHT ANTECUBITAL  Final   Special Requests   Final    BOTTLES DRAWN AEROBIC AND ANAEROBIC Blood Culture adequate volume   Culture NO GROWTH < 24 HOURS  Final   Report Status PENDING  Incomplete     Studies: Dg Chest 2 View  Result Date: 12/07/2016 CLINICAL DATA:  Short of breath.  Lung cancer EXAM: CHEST  2 VIEW COMPARISON:  CT chest 07/24/2016, 02/09/2016. Chest x-ray 02/01/2016. PET 04/24/2016 FINDINGS: Progression of extensive bilateral lung opacities. The air bronchograms associated with these opacities bilaterally. No effusion or heart failure. Port-A-Cath tip in the SVC at the [facility] /RA junction. IMPRESSION: Extensive bilateral airspace disease. This has progressed from prior studies. In this patient with lung cancer, this may represent progression of metastatic disease. Pneumonia is possible particularly in the left upper lobe. Electronically Signed   By: Franchot Gallo M.D.   On: 12/07/2016 15:23   Ct Chest W Contrast  Result Date: 12/07/2016 CLINICAL DATA:  Followup lung cancer. EXAM: CT CHEST WITH CONTRAST TECHNIQUE: Multidetector CT imaging of the chest was performed during intravenous contrast administration. CONTRAST:  26mL ISOVUE-300 IOPAMIDOL (ISOVUE-300) INJECTION 61% COMPARISON:  07/24/2016 FINDINGS: Cardiovascular: The heart size appears normal. Aortic atherosclerosis identified. Calcification within the RCA, LAD and left circumflex coronary artery is identified. Mediastinum/Nodes: The trachea appears patent and is midline. Normal appearance of the esophagus. Mediastinal adenopathy is identified. Index left-sided pre-vascular node measures 1.3 cm, image 49 of series 2. Previously 0.8 cm. Index right paratracheal lymph node Measures 1.4 cm, image 36 of series 2. Previously 1.2 cm. Lower right paratracheal lymph node measures 1.4 cm,  image 43 of series 2. Previously 1.0 cm. 1.6 cm sub- carinal lymph node is unchanged. Lungs/Pleura: Advanced smoking related changes are identified throughout both lungs. There is no pleural effusion. Interval progression of bilateral confluent areas of masslike architectural distortion compatible with multifocal pulmonary adenocarcinoma. Significant worsening aeration to the left upper lobe. Index mass within the lingula measures 6.3 by 4.6 cm, image 78 of series 3. Previously this measured 1.8 by 3.6 cm. Left lower lobe peripheral subpleural mass measures 5.7 cm 5.7 x 3.6 cm, image 94 of series 3. Previously 1.7 x 2.0 cm. Posterior left lower lobe the lesion measures 3.2 by 4.0 cm, image 93 of series 3. Previously 0.8 by 0.8 cm. Large area of masslike consolidation and ground-glass attenuation in the left upper lobe Measures 6.2 x 12.8 cm. Previously index nodule in this area measured 1.1 x 1.1 cm. Index lesion within the  posterior and lateral left upper lobe has solid, cystic and ground-glass components measuring 9.4 x 5.8 cm, image 58 of series 3. Previous 5.9 by 4.0 cm. Upper Abdomen: No acute abnormality. Musculoskeletal: No chest wall abnormality. No acute or significant osseous findings. IMPRESSION: 1. There is been significant interval worsening aeration of both lungs. 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 Findings are also compatible with progression of disease with superimposed multifocal bilateral infection. 2. Progression of mediastinal adenopathy. 3. Aortic Atherosclerosis (ICD10-I70.0) and Emphysema (ICD10-J43.9). 4. Multi vessel coronary artery calcifications. Electronically Signed   By: Kerby Moors M.D.   On: 12/07/2016 16:29    Scheduled Meds: . atorvastatin  20 mg Oral Daily  . budesonide (PULMICORT) nebulizer solution  0.5 mg Nebulization BID  . enoxaparin (LOVENOX) injection  40 mg Subcutaneous Q24H  . guaiFENesin  600 mg Oral  BID  . ipratropium-albuterol  3 mL Nebulization Q6H  . magnesium oxide  400 mg Oral Daily  . methylPREDNISolone (SOLU-MEDROL) injection  40 mg Intravenous Daily  . polyethylene glycol  17 g Oral Daily  . sodium bicarbonate  1,300 mg Oral Daily   Continuous Infusions: . sodium chloride 40 mL/hr at 12/08/16 1044  . aztreonam 2 g (12/08/16 1359)  . vancomycin Stopped (12/08/16 0748)    Assessment/Plan:  1. Acute hypoxic respiratory failure.  Oxygen supplementation.  Try to taper oxygen down to the lowest amount possible. 2. COPD exacerbation start Solu-Medrol 40 mg daily.  Use budesonide nebulizer solution DuoNeb nebulizer solution to get better air entry. 3. Stage IV lung cancer which has progressed 4. Questionable pneumonia.  Pro-calcitonin borderline.  Patient on aztreonam and vancomycin.  Send off an MRSA PCR screen 5. Constipation start MiraLAX 6. Hyperlipidemia unspecified on atorvastatin  Code Status:     Code Status Orders  (From admission, onward)        Start     Ordered   12/07/16 1821  Full code  Continuous     12/07/16 1820    Code Status History    Date Active Date Inactive Code Status Order ID Comments User Context   12/14/2015 14:12 12/17/2015 20:44 Full Code [id number]  Nestor Lewandowsky, MD Inpatient     Family Communication: Family at bedside Disposition Plan: Breathing will need to be better prior to disposition  Antibiotics:  Aztreonam  Vancomycin  Time spent: 28 minutes  [place], [place]

## 2016-12-09 LAB — URINE CULTURE: CULTURE: NO GROWTH

## 2016-12-09 LAB — PROCALCITONIN: Procalcitonin: 0.27 ng/mL

## 2016-12-09 LAB — HIV ANTIBODY (ROUTINE TESTING W REFLEX): HIV Screen 4th Generation wRfx: NONREACTIVE

## 2016-12-09 MED ORDER — VANCOMYCIN HCL IN DEXTROSE 750-5 MG/150ML-% IV SOLN
750.0000 mg | INTRAVENOUS | Status: DC
  Administered 2016-12-09: 750 mg via INTRAVENOUS
  Filled 2016-12-09: qty 150

## 2016-12-09 MED ORDER — ENSURE ENLIVE PO LIQD
237.0000 mL | ORAL | Status: DC
  Administered 2016-12-09: 237 mL via ORAL

## 2016-12-09 MED ORDER — IPRATROPIUM-ALBUTEROL 0.5-2.5 (3) MG/3ML IN SOLN
3.0000 mL | Freq: Three times a day (TID) | RESPIRATORY_TRACT | Status: DC
  Administered 2016-12-09 – 2016-12-10 (×5): 3 mL via RESPIRATORY_TRACT
  Filled 2016-12-09: qty 3
  Filled 2016-12-09: qty 30
  Filled 2016-12-09 (×3): qty 3

## 2016-12-09 NOTE — Progress Notes (Signed)
Hematology/Oncology Progress Note [facility] [phone] [phone]  Patient Care Team: Anthonette Legato, MD as PCP - General (Internal Medicine) Nestor Lewandowsky, MD as Referring Physician (Cardiothoracic Surgery) Flora Lipps, MD as Consulting Physician (Pulmonary Disease) [name], MD as Consulting Physician (Pulmonary Disease)   Name of the patient: Christina Sharp  [id number]  [birth year]  Date of visit: 12/09/16  INTERVAL HISTORY- Patient reports having a good night sleep. Breathing has improved.    Review of systems- Review of Systems  Constitutional: Positive for malaise/fatigue.  HENT: Negative for hearing loss.   Eyes: Negative for blurred vision.  Respiratory: Positive for cough. Negative for shortness of breath.   Cardiovascular: Negative for chest pain.  Gastrointestinal: Negative for heartburn.  Genitourinary: Negative for dysuria.  Musculoskeletal: Negative for myalgias.  Skin: Negative for rash.  Neurological: Negative for dizziness.  Endo/Heme/Allergies: Does not bruise/bleed easily.  Psychiatric/Behavioral: Negative for depression.    Allergies  Allergen Reactions  . Augmentin [Amoxicillin-Pot Clavulanate] Hives  . Penicillins Hives    Has patient had a PCN reaction causing immediate rash, facial/tongue/throat swelling, SOB or lightheadedness with hypotension: No Has patient had a PCN reaction causing severe rash involving mucus membranes or skin necrosis: No Has patient had a PCN reaction that required hospitalization No Has patient had a PCN reaction occurring within the last 10 years: Yes If all of the above answers are "NO", then may proceed with Cephalosporin use.      Patient Active Problem List   Diagnosis Date Noted  . Hypoxia   . Cough   . PNA (pneumonia) 12/07/2016  . Goals of care, counseling/discussion 05/26/2016  . Low TSH level 04/06/2016  . Hypokalemia 03/16/2016  . Hypomagnesemia  03/16/2016  . Low serum adrenocorticotrophic hormone (ACTH) 02/14/2016  . Encounter for antineoplastic chemotherapy 02/04/2016  . Malignant neoplasm of lung [facility])      Past Medical History:  Diagnosis Date  . Cancer ([place])    basal cell   . Emphysema of lung ([place])   . Hypercholesteremia   . Hypertension   . Lung mass   . Renal disorder    renal insufficiency     Past Surgical History:  Procedure Laterality Date  . BASAL CELL CARCINOMA EXCISION    . COLONOSCOPY      Social History   Socioeconomic History  . Marital status: Single    Spouse name: Not on file  . Number of children: Not on file  . Years of education: Not on file  . Highest education level: Not on file  Social Needs  . Financial resource strain: Not hard at all  . Food insecurity - worry: Never true  . Food insecurity - inability: Never true  . Transportation needs - medical: No  . Transportation needs - non-medical: No  Occupational History  . Not on file  Tobacco Use  . Smoking status: Former Smoker    Packs/day: 0.50    Years: 45.00    Pack years: 22.50    Types: Cigarettes    Last attempt to quit: 11/06/2015    Years since quitting: 1.0  . Smokeless tobacco: Never Used  . Tobacco comment: hasn't smoked since 11-06-15  Substance and Sexual Activity  . Alcohol use: Yes    Comment: rare  . Drug use: No  . Sexual activity: Not on file  Other Topics Concern  . Not on file  Social History Narrative  . Not on file     Family  History  Problem Relation Age of Onset  . Alzheimer's disease Mother   . Heart disease Father   . Heart attack Brother      Current Facility-Administered Medications:  .  acetaminophen (TYLENOL) tablet 650 mg, 650 mg, Oral, Q6H PRN, Gouru, Aruna, MD .  albuterol (PROVENTIL) (2.5 MG/3ML) 0.083% nebulizer solution 2.5 mg, 2.5 mg, Nebulization, Q4H PRN, Gouru, Aruna, MD .  atorvastatin (LIPITOR) tablet 20 mg, 20 mg, Oral, Daily, Gouru, Aruna, MD, 20 mg at 12/09/16  0936 .  aztreonam (AZACTAM) 2 g in dextrose 5 % 50 mL IVPB, 2 g, Intravenous, Q8H, Gouru, Aruna, MD, Stopped at 12/09/16 0740 .  benzonatate (TESSALON) capsule 200 mg, 200 mg, Oral, TID PRN, Gouru, Aruna, MD, 200 mg at 12/08/16 0933 .  budesonide (PULMICORT) nebulizer solution 0.5 mg, 0.5 mg, Nebulization, BID, Leslye Peer, Richard, MD, 0.5 mg at 12/09/16 0717 .  enoxaparin (LOVENOX) injection 40 mg, 40 mg, Subcutaneous, Q24H, Gouru, Aruna, MD, 40 mg at 12/08/16 2142 .  guaiFENesin (MUCINEX) 12 hr tablet 600 mg, 600 mg, Oral, BID, Leslye Peer, Richard, MD, 600 mg at 12/09/16 0936 .  ipratropium-albuterol (DUONEB) 0.5-2.5 (3) MG/3ML nebulizer solution 3 mL, 3 mL, Nebulization, TID, Gouru, Aruna, MD, 3 mL at 12/09/16 0717 .  magnesium oxide (MAG-OX) tablet 400 mg, 400 mg, Oral, Daily, Gouru, Aruna, MD, 400 mg at 12/09/16 0937 .  MEDLINE mouth rinse, 15 mL, Mouth Rinse, BID, Wieting, Richard, MD .  methylPREDNISolone sodium succinate (SOLU-MEDROL) 40 mg/mL injection 40 mg, 40 mg, Intravenous, Daily, Leslye Peer, Richard, MD, 40 mg at 12/09/16 0936 .  ondansetron (ZOFRAN) tablet 4 mg, 4 mg, Oral, Q6H PRN, Gouru, Aruna, MD .  polyethylene glycol (MIRALAX / GLYCOLAX) packet 17 g, 17 g, Oral, Daily, Leslye Peer, Richard, MD, 17 g at 12/09/16 0936 .  sodium bicarbonate tablet 1,300 mg, 1,300 mg, Oral, Daily, Gouru, Aruna, MD, 1,300 mg at 12/09/16 8144  Facility-Administered Medications Ordered in Other Encounters:  .  heparin lock flush 100 unit/mL, 500 Units, Intravenous, Once, Lequita Asal, MD   Physical exam:  Vitals:   12/08/16 1927 12/08/16 2000 12/09/16 0406 12/09/16 0717  BP:  (!) 107/58 109/64   Pulse:  (!) 106 91   Resp:  (!) 21 19   Temp:  98 F (36.7 C) 97.6 F (36.4 C)   TempSrc:  Oral Oral   SpO2: 95% 91% 99% 96%  Weight:      Height:       GENERAL no acute distress.  EYES: Pupils equal, round, reactive to light and accommodation. No scleral icterus. Extraocular muscles intact.  HEENT:  Head atraumatic, normocephalic. Oropharynx and nasopharynx clear.  NECK: Supple, no jugular venous distention.  LUNGS:moderate rattling,breath sounds bilaterally,  bilateral coarse crepitation. No use of accessory muscles of respiration.  CARDIOVASCULAR: S1, S2 normal. No murmurs, rubs, or gallops.Right anterior chest wall with port ABDOMEN: Soft, nontender, nondistended. Bowel sounds present. No organomegaly or mass.  EXTREMITIES: No pedal edema, cyanosis, or clubbing.  NEUROLOGIC: Cranial nerves II through XII are intact. Muscle strength 5/5 in all extremities. Sensation intact. Gait not checked.  PSYCHIATRIC: The patient is alert and oriented x 3.  SKIN: No obvious rash, lesion, or ulcer.      CMP Latest Ref Rng & Units 12/07/2016  Glucose 65 - 99 mg/dL 121(H)  BUN 6 - 20 mg/dL 22(H)  Creatinine 0.44 - 1.00 mg/dL 1.17(H)  Sodium 135 - 145 mmol/L 137  Potassium 3.5 - 5.1 mmol/L 4.0  Chloride 101 -  111 mmol/L 99(L)  CO2 22 - 32 mmol/L 24  Calcium 8.9 - 10.3 mg/dL 8.6(L)  Total Protein 6.5 - 8.1 g/dL 6.8  Total Bilirubin 0.3 - 1.2 mg/dL 1.0  Alkaline Phos 38 - 126 U/L 127(H)  AST 15 - 41 U/L 33  ALT 14 - 54 U/L 24   CBC Latest Ref Rng & Units 12/08/2016  WBC 3.6 - 11.0 K/uL 9.3  Hemoglobin 12.0 - 16.0 g/dL 8.7(L)  Hematocrit 35.0 - 47.0 % 26.6(L)  Platelets 150 - 440 K/uL 72(L)    @IMAGES @  Dg Chest 2 View  Result Date: 12/07/2016 CLINICAL DATA:  Short of breath.  Lung cancer EXAM: CHEST  2 VIEW COMPARISON:  CT chest 07/24/2016, 02/09/2016. Chest x-ray 02/01/2016. PET 04/24/2016 FINDINGS: Progression of extensive bilateral lung opacities. The air bronchograms associated with these opacities bilaterally. No effusion or heart failure. Port-A-Cath tip in the SVC at the [facility] /RA junction. IMPRESSION: Extensive bilateral airspace disease. This has progressed from prior studies. In this patient with lung cancer, this may represent progression of metastatic disease. Pneumonia is  possible particularly in the left upper lobe. Electronically Signed   By: Franchot Gallo M.D.   On: 12/07/2016 15:23   Ct Chest W Contrast  Result Date: 12/07/2016 CLINICAL DATA:  Followup lung cancer. EXAM: CT CHEST WITH CONTRAST TECHNIQUE: Multidetector CT imaging of the chest was performed during intravenous contrast administration. CONTRAST:  80mL ISOVUE-300 IOPAMIDOL (ISOVUE-300) INJECTION 61% COMPARISON:  07/24/2016 FINDINGS: Cardiovascular: The heart size appears normal. Aortic atherosclerosis identified. Calcification within the RCA, LAD and left circumflex coronary artery is identified. Mediastinum/Nodes: The trachea appears patent and is midline. Normal appearance of the esophagus. Mediastinal adenopathy is identified. Index left-sided pre-vascular node measures 1.3 cm, image 49 of series 2. Previously 0.8 cm. Index right paratracheal lymph node Measures 1.4 cm, image 36 of series 2. Previously 1.2 cm. Lower right paratracheal lymph node measures 1.4 cm, image 43 of series 2. Previously 1.0 cm. 1.6 cm sub- carinal lymph node is unchanged. Lungs/Pleura: Advanced smoking related changes are identified throughout both lungs. There is no pleural effusion. Interval progression of bilateral confluent areas of masslike architectural distortion compatible with multifocal pulmonary adenocarcinoma. Significant worsening aeration to the left upper lobe. Index mass within the lingula measures 6.3 by 4.6 cm, image 78 of series 3. Previously this measured 1.8 by 3.6 cm. Left lower lobe peripheral subpleural mass measures 5.7 cm 5.7 x 3.6 cm, image 94 of series 3. Previously 1.7 x 2.0 cm. Posterior left lower lobe the lesion measures 3.2 by 4.0 cm, image 93 of series 3. Previously 0.8 by 0.8 cm. Large area of masslike consolidation and ground-glass attenuation in the left upper lobe Measures 6.2 x 12.8 cm. Previously index nodule in this area measured 1.1 x 1.1 cm. Index lesion within the posterior and lateral left  upper lobe has solid, cystic and ground-glass components measuring 9.4 x 5.8 cm, image 58 of series 3. Previous 5.9 by 4.0 cm. Upper Abdomen: No acute abnormality. Musculoskeletal: No chest wall abnormality. No acute or significant osseous findings. IMPRESSION: 1. There is been significant interval worsening aeration of both lungs. 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 Findings are also compatible with progression of disease with superimposed multifocal bilateral infection. 2. Progression of mediastinal adenopathy. 3. Aortic Atherosclerosis (ICD10-I70.0) and Emphysema (ICD10-J43.9). 4. Multi vessel coronary artery calcifications. Electronically Signed   By: Kerby Moors M.D.   On: 12/07/2016 16:29    Assessment  and plan-  # Acute hypoxic respiratory failure secondary to healthcare associated pneumonia continue antibiotics.  # Stage IV lung adenocarcinoma, hold chemotherapy. Last dose of gemcitabine 8 days ago.  # Thrombocytopenia: likely secondary to chemotherapy. No active bleeding.  Daily CBC. If platelet <50,000, hold prophylactic Lovenox, continue Intermittent pneumatic compression   Discussed with patient about code status, she prefers Full code. Outpatient follow up with Dr.Corcoran for discussion of treatment plan.    Earlie Server, MD, PhD [facility] Pager- [phone] 12/09/2016

## 2016-12-09 NOTE — Progress Notes (Signed)
Initial Nutrition Assessment  DOCUMENTATION CODES:   Non-severe (moderate) malnutrition in context of chronic illness  INTERVENTION:  Recommend liberalizing diet to regular.  Provide Ensure Enlive po daily, each supplement provides 350 kcal and 20 grams of protein. Patient prefers to have at bedtime.  Encouraged adequate intake of calories and protein at meals.  NUTRITION DIAGNOSIS:   Moderate Malnutrition related to chronic illness(stage IV adenocarcinoma of lung) as evidenced by moderate fat depletion, mild muscle depletion, moderate muscle depletion.  GOAL:   Patient will meet greater than or equal to 90% of their needs  MONITOR:   PO intake, Supplement acceptance, Labs, Weight trends, I & O's  REASON FOR ASSESSMENT:   Malnutrition Screening Tool    ASSESSMENT:   71 year old female with PMHx of HTN, hypercholesterolemia, emphysema of lung, stage IV adenocarcinoma of lung on chemotherapy who was admitted with acute exacerbation of COPD, questionable PNA.   Met with patient at bedside. She reports that PTA she had a decreased appetite for approximately 1 week when she was having difficulty breathing. She also felt dehydrated, which changed how foods tasted. She was still eating 3 meals per day, but just not as much. She reports her appetite is much better now and almost back to baseline. She is finishing almost all of her meals. Does endorse some constipation. Patient is amenable to drinking one Ensure Enlive daily at bedtime to help prevent further unintentional weight loss in setting of her increased needs.  UBW wast 153 lbs prior to diagnosis. Per chart patient was 155.6 lbs on 12/20/2015. She has lost 15.6 lbs (10% body weight) over the past year, which is not significant for time frame.  Meal Completion: 90-100%  Medications reviewed and include: magnesium oxide 400 mg daily, methylprednisolone 40 mg daily IV, Miralax, sodium bicarbonate 1300 mg daily PO.  Labs  reviewed: On 11/9 Chloride 99, BUN 22, Creatinine 1.17.  Discussed with RN.  NUTRITION - FOCUSED PHYSICAL EXAM:    Most Recent Value  Orbital Region  Moderate depletion  Upper Arm Region  Moderate depletion  Thoracic and Lumbar Region  Mild depletion  Buccal Region  Moderate depletion  Temple Region  Moderate depletion  Clavicle Bone Region  Moderate depletion  Clavicle and Acromion Bone Region  Moderate depletion  Scapular Bone Region  Moderate depletion  Dorsal Hand  Moderate depletion  Patellar Region  Mild depletion  Anterior Thigh Region  Mild depletion  Posterior Calf Region  Mild depletion  Edema (RD Assessment)  None  Hair  Reviewed  Eyes  Reviewed  Mouth  Reviewed  Skin  Reviewed  Nails  Reviewed     Diet Order:  Diet 2 gram sodium Room service appropriate? Yes; Fluid consistency: Thin  EDUCATION NEEDS:   No education needs have been identified at this time  Skin:  Skin Assessment: Reviewed RN Assessment(abrasions to bilateral legs)  Last BM:  12/09/2016 - medium type 2  Height:   Ht Readings from Last 1 Encounters:  12/07/16 5' 4"  (1.626 m)    Weight:   Wt Readings from Last 1 Encounters:  12/07/16 140 lb (63.5 kg)    Ideal Body Weight:  54.5 kg  BMI:  Body mass index is 24.03 kg/m.  Estimated Nutritional Needs:   Kcal:  1475-1700 (MSJ x 1.3-1.5)  Protein:  83-95 grams (1.3-1.5 grams/kg)  Fluid:  1.6-1.9 L/day (25-30 mL/kg)  Willey Blade, MS, RD, LDN Office: [phone] Pager: [phone] After Hours/Weekend Pager: [phone]

## 2016-12-09 NOTE — Progress Notes (Signed)
Patient ID: Christina Sharp, female   DOB: [birth year], 71 y.o.   MRN: [id number]  ACP Note.  Patient and family at bedside.  Diagnosis acute hypoxic respiratory failure, stage IV adenocarcinoma of the lung with worsening radiological appearance on CT scan.  CODE STATUS discussed.  I explained the CPR process at length.  Patient would still like to be a full code at this point in time.  I advised as disease progresses she may has to think about this again in the future.  Time spent on ACP discussion 17 minutes Dr. Loletha Grayer

## 2016-12-09 NOTE — Progress Notes (Signed)
Patient ID: Christina Sharp, female   DOB: February 16, 1945, 71 y.o.   MRN: [id number]  [facility] PROGRESS NOTE  Christina Sharp XLK:[id number] DOB: 09-26-1945 DOA: 12/07/2016 PCP: Anthonette Legato, MD  HPI/Subjective: Patient feeling a little bit better.  Still with some shortness of breath and cough.  Has gotten up to go to the bathroom without worsening shortness of breath.  Objective: Vitals:   12/09/16 0406 12/09/16 0717  BP: 109/64   Pulse: 91   Resp: 19   Temp: 97.6 F (36.4 C)   SpO2: 99% 96%    Filed Weights   12/07/16 1450  Weight: 63.5 kg (140 lb)    ROS: Review of Systems  Constitutional: Negative for chills and fever.  Eyes: Negative for blurred vision.  Respiratory: Positive for cough, shortness of breath and wheezing.   Cardiovascular: Negative for chest pain.  Gastrointestinal: Negative for abdominal pain, constipation, diarrhea, nausea and vomiting.  Genitourinary: Negative for dysuria.  Musculoskeletal: Negative for joint pain.  Neurological: Negative for dizziness and headaches.   Exam: Physical Exam  Constitutional: She is oriented to person, place, and time.  HENT:  Nose: No mucosal edema.  Mouth/Throat: No oropharyngeal exudate or posterior oropharyngeal edema.  Eyes: Conjunctivae, EOM and lids are normal. Pupils are equal, round, and reactive to light.  Neck: No JVD present. Carotid bruit is not present. No edema present. No thyroid mass and no thyromegaly present.  Cardiovascular: S1 normal and S2 normal. Exam reveals no gallop.  No murmur heard. Pulses:      Dorsalis pedis pulses are 2+ on the right side, and 2+ on the left side.  Respiratory: No respiratory distress. She has decreased breath sounds in the right lower field, the left middle field and the left lower field. She has wheezes in the right lower field. She has rhonchi in the left lower field. She has no rales.  GI: Soft. Bowel sounds are normal. There is no tenderness.   Musculoskeletal:       Right ankle: She exhibits swelling.       Left ankle: She exhibits swelling.  Lymphadenopathy:    She has no cervical adenopathy.  Neurological: She is alert and oriented to person, place, and time. No cranial nerve deficit.  Skin: Skin is warm. No rash noted. Nails show no clubbing.  Psychiatric: She has a normal mood and affect.      Data Reviewed: Basic Metabolic Panel: Recent Labs  Lab 12/04/16 1429 12/07/16 1308 12/07/16 1528  NA 129* 134* 137  K 4.1 3.9 4.0  CL 96* 99* 99*  CO2 22 24 24   GLUCOSE 115* 160* 121*  BUN 21* 22* 22*  CREATININE 1.11* 1.20* 1.17*  CALCIUM 8.7* 8.6* 8.6*   Liver Function Tests: Recent Labs  Lab 12/04/16 1429 12/07/16 1308 12/07/16 1528  AST 27 35 33  ALT 19 24 24   ALKPHOS 97 124 127*  BILITOT 0.7 1.0 1.0  PROT 6.9 6.9 6.8  ALBUMIN 3.2* 2.8* 2.8*   CBC: Recent Labs  Lab 12/04/16 1429 12/07/16 1308 12/07/16 1528 12/08/16 0506  WBC 6.8 12.3* 10.2 9.3  NEUTROABS 6.2 10.2* 8.3* 7.5*  HGB 11.0* 10.4* 10.8* 8.7*  HCT 33.8* 31.9* 32.1* 26.6*  MCV 94.7 94.5 94.0 94.4  PLT 127* 87* 78* 72*   Cardiac Enzymes: Recent Labs  Lab 12/07/16 1528  TROPONINI 0.06*   BNP (last 3 results) Recent Labs    12/07/16 1528  BNP 181.0*      Recent Results (  from the past 240 hour(s))  Blood Culture (routine x 2)     Status: None (Preliminary result)   Collection Time: 12/07/16  3:28 PM  Result Value Ref Range Status   Specimen Description BLOOD BLOOD RIGHT FOREARM  Final   Special Requests   Final    BOTTLES DRAWN AEROBIC AND ANAEROBIC Blood Culture results may not be optimal due to an excessive volume of blood received in culture bottles   Culture NO GROWTH 2 DAYS  Final   Report Status PENDING  Incomplete  Blood Culture (routine x 2)     Status: None (Preliminary result)   Collection Time: 12/07/16  3:30 PM  Result Value Ref Range Status   Specimen Description BLOOD RIGHT ANTECUBITAL  Final   Special  Requests   Final    BOTTLES DRAWN AEROBIC AND ANAEROBIC Blood Culture adequate volume   Culture NO GROWTH 2 DAYS  Final   Report Status PENDING  Incomplete  Urine culture     Status: None   Collection Time: 12/07/16  6:31 PM  Result Value Ref Range Status   Specimen Description URINE, RANDOM  Final   Special Requests NONE  Final   Culture   Final    NO GROWTH Performed at [facility], [place] [address], [place], [place] [address]    Report Status 12/09/2016 FINAL  Final  MRSA PCR Screening     Status: None   Collection Time: 12/08/16  2:09 PM  Result Value Ref Range Status   MRSA by PCR NEGATIVE NEGATIVE Final    Comment:        The GeneXpert MRSA Assay (FDA approved for NASAL specimens only), is one component of a comprehensive MRSA colonization surveillance program. It is not intended to diagnose MRSA infection nor to guide or monitor treatment for MRSA infections.      Studies: Dg Chest 2 View  Result Date: 12/07/2016 CLINICAL DATA:  Short of breath.  Lung cancer EXAM: CHEST  2 VIEW COMPARISON:  CT chest 07/24/2016, 02/09/2016. Chest x-ray 02/01/2016. PET 04/24/2016 FINDINGS: Progression of extensive bilateral lung opacities. The air bronchograms associated with these opacities bilaterally. No effusion or heart failure. Port-A-Cath tip in the SVC at the [facility] /RA junction. IMPRESSION: Extensive bilateral airspace disease. This has progressed from prior studies. In this patient with lung cancer, this may represent progression of metastatic disease. Pneumonia is possible particularly in the left upper lobe. Electronically Signed   By: Franchot Gallo M.D.   On: 12/07/2016 15:23   Ct Chest W Contrast  Result Date: 12/07/2016 CLINICAL DATA:  Followup lung cancer. EXAM: CT CHEST WITH CONTRAST TECHNIQUE: Multidetector CT imaging of the chest was performed during intravenous contrast administration. CONTRAST:  69mL ISOVUE-300 IOPAMIDOL (ISOVUE-300) INJECTION 61% COMPARISON:   07/24/2016 FINDINGS: Cardiovascular: The heart size appears normal. Aortic atherosclerosis identified. Calcification within the RCA, LAD and left circumflex coronary artery is identified. Mediastinum/Nodes: The trachea appears patent and is midline. Normal appearance of the esophagus. Mediastinal adenopathy is identified. Index left-sided pre-vascular node measures 1.3 cm, image 49 of series 2. Previously 0.8 cm. Index right paratracheal lymph node Measures 1.4 cm, image 36 of series 2. Previously 1.2 cm. Lower right paratracheal lymph node measures 1.4 cm, image 43 of series 2. Previously 1.0 cm. 1.6 cm sub- carinal lymph node is unchanged. Lungs/Pleura: Advanced smoking related changes are identified throughout both lungs. There is no pleural effusion. Interval progression of bilateral confluent areas of masslike architectural distortion compatible with multifocal  pulmonary adenocarcinoma. Significant worsening aeration to the left upper lobe. Index mass within the lingula measures 6.3 by 4.6 cm, image 78 of series 3. Previously this measured 1.8 by 3.6 cm. Left lower lobe peripheral subpleural mass measures 5.7 cm 5.7 x 3.6 cm, image 94 of series 3. Previously 1.7 x 2.0 cm. Posterior left lower lobe the lesion measures 3.2 by 4.0 cm, image 93 of series 3. Previously 0.8 by 0.8 cm. Large area of masslike consolidation and ground-glass attenuation in the left upper lobe Measures 6.2 x 12.8 cm. Previously index nodule in this area measured 1.1 x 1.1 cm. Index lesion within the posterior and lateral left upper lobe has solid, cystic and ground-glass components measuring 9.4 x 5.8 cm, image 58 of series 3. Previous 5.9 by 4.0 cm. Upper Abdomen: No acute abnormality. Musculoskeletal: No chest wall abnormality. No acute or significant osseous findings. IMPRESSION: 1. There is been significant interval worsening aeration of both lungs. Findings are compatible with marked progression of multifocal bilateral pulmonary  adenocarcinoma. Findings are also compatible with progression of disease with superimposed multifocal bilateral infection. 2. Progression of mediastinal adenopathy. 3. Aortic Atherosclerosis (ICD10-I70.0) and Emphysema (ICD10-J43.9). 4. Multi vessel coronary artery calcifications. Electronically Signed   By: Kerby Moors M.D.   On: 12/07/2016 16:29    Scheduled Meds: . atorvastatin  20 mg Oral Daily  . budesonide (PULMICORT) nebulizer solution  0.5 mg Nebulization BID  . enoxaparin (LOVENOX) injection  40 mg Subcutaneous Q24H  . guaiFENesin  600 mg Oral BID  . ipratropium-albuterol  3 mL Nebulization TID  . magnesium oxide  400 mg Oral Daily  . mouth rinse  15 mL Mouth Rinse BID  . methylPREDNISolone (SOLU-MEDROL) injection  40 mg Intravenous Daily  . polyethylene glycol  17 g Oral Daily  . sodium bicarbonate  1,300 mg Oral Daily   Continuous Infusions: . aztreonam Stopped (12/09/16 0740)    Assessment/Plan:  1. Acute hypoxic respiratory failure.  Oxygen supplementation.  I taper down to 2 L.  Pulse ox on room air tomorrow morning 2. COPD exacerbation.  Continue Solu-Medrol 40 mg daily.  Use budesonide nebulizer solution DuoNeb nebulizer solution to get better air entry. 3. Stage IV lung cancer which has progressed.  Please see ACP note 4. Questionable pneumonia.  Pro-calcitonin borderline.  Patient on aztreonam.  Potentially switch over to Levaquin upon disposition 5. Constipation start MiraLAX 6. Hyperlipidemia unspecified on atorvastatin  Code Status:     Code Status Orders  (From admission, onward)        Start     Ordered   12/07/16 1821  Full code  Continuous     12/07/16 1820    Code Status History    Date Active Date Inactive Code Status Order ID Comments User Context   12/14/2015 14:12 12/17/2015 20:44 Full Code [id number]  Nestor Lewandowsky, MD Inpatient     Family Communication: Family at bedside Disposition Plan: Evaluate daily on when to go  home  Antibiotics:  Aztreonam  Time spent: 35 minutes including ACP discussion  Loletha Grayer  [employer]

## 2016-12-10 ENCOUNTER — Other Ambulatory Visit: Payer: Self-pay

## 2016-12-10 LAB — BLOOD GAS, VENOUS
ACID-BASE EXCESS: 4.9 mmol/L — AB (ref 0.0–2.0)
Bicarbonate: 28.1 mmol/L — ABNORMAL HIGH (ref 20.0–28.0)
O2 Saturation: 84.3 %
PH VEN: 7.5 — AB (ref 7.250–7.430)
Patient temperature: 37
pCO2, Ven: 36 mmHg — ABNORMAL LOW (ref 44.0–60.0)
pO2, Ven: 44 mmHg (ref 32.0–45.0)

## 2016-12-10 LAB — LEGIONELLA PNEUMOPHILA SEROGP 1 UR AG: L. PNEUMOPHILA SEROGP 1 UR AG: NEGATIVE

## 2016-12-10 MED ORDER — MOMETASONE FURO-FORMOTEROL FUM 100-5 MCG/ACT IN AERO
2.0000 | INHALATION_SPRAY | Freq: Two times a day (BID) | RESPIRATORY_TRACT | Status: DC
  Filled 2016-12-10: qty 8.8

## 2016-12-10 MED ORDER — ALBUTEROL SULFATE (2.5 MG/3ML) 0.083% IN NEBU: 2.5000 mg | INHALATION_SOLUTION | RESPIRATORY_TRACT | 0 refills | Status: AC | PRN

## 2016-12-10 MED ORDER — LEVOFLOXACIN 500 MG PO TABS: 500.0000 mg | ORAL_TABLET | Freq: Every day | ORAL | Status: DC

## 2016-12-10 MED ORDER — MOMETASONE FURO-FORMOTEROL FUM 100-5 MCG/ACT IN AERO: 2.0000 | INHALATION_SPRAY | Freq: Two times a day (BID) | RESPIRATORY_TRACT | 0 refills | Status: AC

## 2016-12-10 MED ORDER — PREDNISONE 20 MG PO TABS: 40.0000 mg | ORAL_TABLET | Freq: Every day | ORAL | Status: DC

## 2016-12-10 MED ORDER — LEVOFLOXACIN 500 MG PO TABS: 500.0000 mg | ORAL_TABLET | Freq: Every day | ORAL | 0 refills | Status: DC

## 2016-12-10 MED ORDER — PREDNISONE 20 MG PO TABS: 40.0000 mg | ORAL_TABLET | Freq: Every day | ORAL | 0 refills | Status: DC

## 2016-12-10 MED ORDER — HEPARIN SOD (PORK) LOCK FLUSH 100 UNIT/ML IV SOLN
500.0000 [IU] | Freq: Once | INTRAVENOUS | Status: AC
  Administered 2016-12-10: 15:00:00 500 [IU] via INTRAVENOUS
  Filled 2016-12-10: qty 5

## 2016-12-10 MED ORDER — ALBUTEROL SULFATE HFA 108 (90 BASE) MCG/ACT IN AERS: 2.0000 | INHALATION_SPRAY | Freq: Four times a day (QID) | RESPIRATORY_TRACT | 0 refills | Status: AC | PRN

## 2016-12-10 MED ORDER — ENSURE ENLIVE PO LIQD: 237.0000 mL | ORAL | 0 refills | Status: AC

## 2016-12-10 MED ORDER — ALBUTEROL SULFATE HFA 108 (90 BASE) MCG/ACT IN AERS
2.0000 | INHALATION_SPRAY | Freq: Four times a day (QID) | RESPIRATORY_TRACT | Status: DC | PRN
  Filled 2016-12-10: qty 6.7

## 2016-12-10 NOTE — Progress Notes (Signed)
SATURATION QUALIFICATIONS: (This note is used to comply with regulatory documentation for home oxygen)  Patient Saturations on Room Air at Rest = 87%  Patient Saturations on Room Air while Ambulating = 81%  Patient Saturations on 3 Liters of oxygen while Ambulating = 92%  Please briefly explain why patient needs home oxygen: lung cancer

## 2016-12-10 NOTE — Discharge Summary (Signed)
[place] [facility] [place] NAME: Christina Sharp    MR#:  [id number]  DATE OF BIRTH:  12-28-1945  DATE OF ADMISSION:  12/07/2016 ADMITTING PHYSICIAN: Nicholes Mango, MD  DATE OF DISCHARGE: 12/10/2016  PRIMARY CARE PHYSICIAN: Dr Mike Gip    ADMISSION DIAGNOSIS:  Cough [R05] Rhinorrhea [J34.89] Chest congestion [R09.89] Hypoxia [R09.02]  DISCHARGE DIAGNOSIS:  Active Problems:   Malignant neoplasm of lung (HCC)   PNA (pneumonia)   Hypoxia   Cough   SECONDARY DIAGNOSIS:   Past Medical History:  Diagnosis Date  . Cancer ([place])    basal cell   . Emphysema of lung ([place])   . Hypercholesteremia   . Hypertension   . Lung mass   . Renal disorder    renal insufficiency    HOSPITAL COURSE:   1.  Acute hypoxic respiratory failure now chronic.  Pulse ox 87% at rest and 81% while ambulating on room air.  With the progression of the patient's lung cancer and her hypoxia she was prescribed oxygen 2.5 L to go home with and she was advised to wear it 24/7. 2.  COPD exacerbation.  I gave Solu-Medrol 40 mg IV daily during the hospital course.  I use budesonide nebulizers and DuoNeb nebulizer hospital.  Patient states that her breathing is back to her usual even though we do hear some noises in the lungs.  Prescribed nebulizer treatment upon going home, Dulera inhaler, 3 days of prednisone, albuterol inhaler. 3.  Stage IV lung cancer which has progressed on prior CT scan.  Patient is a full code.  Follow-up with oncology as outpatient 4.  Questionable pneumonia.  Pro-calcitonin borderline.  Patient on aztreonam here in the hospital switched over to [place] upon going home. 5.  Hyperlipidemia unspecified on atorvastatin 6.  Borderline elevated troponin secondary to acute hypoxic respiratory failure 7.  Anemia  of chronic disease follow-up as outpatient  DISCHARGE CONDITIONS:   Satisfactory  CONSULTS OBTAINED:  Treatment Team:  Lequita Asal,  MD  DRUG ALLERGIES:   Allergies  Allergen Reactions  . Augmentin [Amoxicillin-Pot Clavulanate] Hives  . Penicillins Hives    Has patient had a PCN reaction causing immediate rash, facial/tongue/throat swelling, SOB or lightheadedness with hypotension: No Has patient had a PCN reaction causing severe rash involving mucus membranes or skin necrosis: No Has patient had a PCN reaction that required hospitalization No Has patient had a PCN reaction occurring within the last 10 years: Yes If all of the above answers are "NO", then may proceed with Cephalosporin use.      DISCHARGE MEDICATIONS:   Current Discharge Medication List    START taking these medications   Details  albuterol (PROVENTIL HFA;VENTOLIN HFA) 108 (90 Base) MCG/ACT inhaler Inhale 2 puffs every 6 (six) hours as needed into the lungs for wheezing or shortness of breath. Qty: 1 Inhaler, Refills: 0    albuterol (PROVENTIL) (2.5 MG/3ML) 0.083% nebulizer solution Take 3 mLs (2.5 mg total) every 4 (four) hours as needed by nebulization for wheezing or shortness of breath. Qty: 360 mL, Refills: 0    feeding supplement, ENSURE ENLIVE, (ENSURE ENLIVE) LIQD Take 237 mLs daily by mouth. Qty: 60 Bottle, Refills: 0    levofloxacin (LEVAQUIN) 500 MG tablet Take 1 tablet (500 mg total) daily by mouth. Qty: 4 tablet, Refills: 0    mometasone-formoterol (DULERA) 100-5 MCG/ACT AERO Inhale 2 puffs 2 (two) times daily into the lungs. Qty: 1 Inhaler, Refills: 0  predniSONE (DELTASONE) 20 MG tablet Take 2 tablets (40 mg total) daily with breakfast by mouth. Qty: 6 tablet, Refills: 0      CONTINUE these medications which have NOT CHANGED   Details  amLODipine (NORVASC) 10 MG tablet Take 10 mg by mouth daily.    atorvastatin (LIPITOR) 20 MG tablet Take 20 mg daily by mouth. Refills: 3    benzonatate (TESSALON) 200 MG capsule Take 1 capsule (200 mg total) 3 (three) times daily as needed by mouth for cough. Qty: 20 capsule,  Refills: 0    hydrALAZINE (APRESOLINE) 50 MG tablet Take 50 mg by mouth 3 (three) times daily.    KLOR-CON 10 10 MEQ tablet TAKE 1 TABLET (10 MEQ TOTAL) BY MOUTH 2 (TWO) TIMES DAILY. Qty: 60 tablet, Refills: 1   Associated Diagnoses: Cancer of lower lobe of right lung ([place]); Hypokalemia; Hypomagnesemia; Low TSH level; Encounter for antineoplastic chemotherapy    magnesium oxide (MAG-OX) 400 MG tablet TAKE 1 TABLET (400 MG TOTAL) BY MOUTH DAILY. Qty: 30 tablet, Refills: 1   Associated Diagnoses: Cancer of lower lobe of right lung ([place]); Low TSH level; Encounter for antineoplastic chemotherapy; Hypomagnesemia; Hypokalemia    ondansetron (ZOFRAN) 8 MG tablet Take 1 tablet every 8 (eight) hours as needed by mouth.    sodium bicarbonate 650 MG tablet Take 1,300 mg by mouth daily.       STOP taking these medications     metoprolol succinate (TOPROL-XL) 100 MG 24 hr tablet          DISCHARGE INSTRUCTIONS:   Follow-up with oncology as outpatient  If you experience worsening of your admission symptoms, develop shortness of breath, life threatening emergency, suicidal or homicidal thoughts you must seek medical attention immediately by calling 911 or calling your MD immediately  if symptoms less severe.  You Must read complete instructions/literature along with all the possible adverse reactions/side effects for all the Medicines you take and that have been prescribed to you. Take any new Medicines after you have completely understood and accept all the possible adverse reactions/side effects.   Please note  You were cared for by a hospitalist during your hospital stay. If you have any questions about your discharge medications or the care you received while you were in the hospital after you are discharged, you can call the unit and asked to speak with the hospitalist on call if the hospitalist that took care of you is not available. Once you are discharged, your primary care physician will  handle any further medical issues. Please note that NO REFILLS for any discharge medications will be authorized once you are discharged, as it is imperative that you return to your primary care physician (or establish a relationship with a primary care physician if you do not have one) for your aftercare needs so that they can reassess your need for medications and monitor your lab values.    Today   CHIEF COMPLAINT:   Chief Complaint  Patient presents with  . Shortness of Breath    HISTORY OF PRESENT ILLNESS:  Christina Sharp  is a 71 y.o. female with a known history of lung cancer presented with shortness of breath   VITAL SIGNS:  Blood pressure 107/60, pulse 96, temperature 97.8 F (36.6 C), temperature source Oral, resp. rate 20, height 5\' 4"  (1.626 m), weight 63.5 kg (140 lb), SpO2 97 %.    PHYSICAL EXAMINATION:  GENERAL:  71 y.o.-year-old patient lying in the bed with no acute distress.  EYES: Pupils equal, round, reactive to light and accommodation. No scleral icterus. Extraocular muscles intact.  HEENT: Head atraumatic, normocephalic. Oropharynx and nasopharynx clear.  NECK:  Supple, no jugular venous distention. No thyroid enlargement, no tenderness.  LUNGS: Decreased breath sounds bilaterally, slight expiratory wheezing at the bases, no rales,rhonchi or crepitation. No use of accessory muscles of respiration.  CARDIOVASCULAR: S1, S2 normal. No murmurs, rubs, or gallops.  ABDOMEN: Soft, non-tender, non-distended. Bowel sounds present. No organomegaly or mass.  EXTREMITIES: No pedal edema, cyanosis, or clubbing.  NEUROLOGIC: Cranial nerves II through XII are intact. Muscle strength 5/5 in all extremities. Sensation intact. Gait not checked.  PSYCHIATRIC: The patient is alert and oriented x 3.  SKIN: No obvious rash, lesion, or ulcer.   DATA REVIEW:   CBC Recent Labs  Lab 12/08/16 0506  WBC 9.3  HGB 8.7*  HCT 26.6*  PLT 72*    Chemistries  Recent Labs  Lab  12/07/16 1528  NA 137  K 4.0  CL 99*  CO2 24  GLUCOSE 121*  BUN 22*  CREATININE 1.17*  CALCIUM 8.6*  AST 33  ALT 24  ALKPHOS 127*  BILITOT 1.0    Cardiac Enzymes Recent Labs  Lab 12/07/16 1528  TROPONINI 0.06*    Microbiology Results  Results for orders placed or performed during the hospital encounter of 12/07/16  Blood Culture (routine x 2)     Status: None (Preliminary result)   Collection Time: 12/07/16  3:28 PM  Result Value Ref Range Status   Specimen Description BLOOD BLOOD RIGHT FOREARM  Final   Special Requests   Final    BOTTLES DRAWN AEROBIC AND ANAEROBIC Blood Culture results may not be optimal due to an excessive volume of blood received in culture bottles   Culture NO GROWTH 3 DAYS  Final   Report Status PENDING  Incomplete  Blood Culture (routine x 2)     Status: None (Preliminary result)   Collection Time: 12/07/16  3:30 PM  Result Value Ref Range Status   Specimen Description BLOOD RIGHT ANTECUBITAL  Final   Special Requests   Final    BOTTLES DRAWN AEROBIC AND ANAEROBIC Blood Culture adequate volume   Culture NO GROWTH 3 DAYS  Final   Report Status PENDING  Incomplete  Urine culture     Status: None   Collection Time: 12/07/16  6:31 PM  Result Value Ref Range Status   Specimen Description URINE, RANDOM  Final   Special Requests NONE  Final   Culture   Final    NO GROWTH Performed at [facility], [place] [address], [place], [place] [address]    Report Status 12/09/2016 FINAL  Final  MRSA PCR Screening     Status: None   Collection Time: 12/08/16  2:09 PM  Result Value Ref Range Status   MRSA by PCR NEGATIVE NEGATIVE Final    Comment:        The GeneXpert MRSA Assay (FDA approved for NASAL specimens only), is one component of a comprehensive MRSA colonization surveillance program. It is not intended to diagnose MRSA infection nor to guide or monitor treatment for MRSA infections.       Management plans discussed with the  patient, family and they are in agreement.  CODE STATUS:     Code Status Orders  (From admission, onward)        Start     Ordered   12/07/16 1821  Full code  Continuous  12/07/16 1820    Code Status History    Date Active Date Inactive Code Status Order ID Comments User Context   12/14/2015 14:12 12/17/2015 20:44 Full Code [id number]  Nestor Lewandowsky, MD Inpatient      TOTAL TIME TAKING CARE OF THIS PATIENT: 35 minutes.    Loletha Grayer M.D on 12/10/2016 at 2:29 PM  Between 7am to 6pm - Pager - [phone]  After 6pm go to [email or web address] - password [email or web address]  [facility] Office  [phone]  CC: Primary care physician;

## 2016-12-10 NOTE — Care Management Note (Signed)
Case Management Note  Patient Details  Name: Christina Sharp MRN: [id number] Date of Birth: Nov 30, 1945  Subjective/Objective:                  Admitted to [facility] with the diagnosis of pneumonia. (lung Cancer).  Lives with friend, Langley Gauss, [phone]. Last seen Dr. Holley Raring 6 weeks ago. Prescriptions are filled at [facility] in [place]. Works 4 days a week at [employer] in [place]. No home Health. No skilled nursing facility. No home oxygen. Rolling walker and cane in the home, if needed. Takes care of all basic activities of daily living herself, drives. No falls. Decreased appetite since being sick.  Action/Plan: Qualifies for home oxygen. Discussed agencies. [facility] will be providing nebulizer too. Discharge to home today per Dr. Leslye Peer.   Expected Discharge Date:  12/10/16               Expected Discharge Plan:     In-House Referral:     Discharge planning Services     Post Acute Care Choice:    Choice offered to:     DME Arranged:   yes DME Agency:   [facility].  HH Arranged:    [place] Agency:     Status of Service:     If discussed at [employer] [facility] [employer], dates discussed:    Additional Comments:  Shelbie Ammons, RN MSN CCM Care Management [phone] 12/10/2016, 10:57 AM

## 2016-12-11 ENCOUNTER — Other Ambulatory Visit: Payer: Self-pay | Admitting: *Deleted

## 2016-12-11 ENCOUNTER — Inpatient Hospital Stay (HOSPITAL_BASED_OUTPATIENT_CLINIC_OR_DEPARTMENT_OTHER): Payer: BLUE CROSS/BLUE SHIELD | Admitting: Hematology and Oncology

## 2016-12-11 ENCOUNTER — Inpatient Hospital Stay: Payer: BLUE CROSS/BLUE SHIELD

## 2016-12-11 ENCOUNTER — Telehealth: Payer: Self-pay | Admitting: *Deleted

## 2016-12-11 VITALS — BP 122/77 | HR 66 | Temp 97.8°F | Resp 21 | Wt 142.0 lb

## 2016-12-11 DIAGNOSIS — R5383 Other fatigue: Secondary | ICD-10-CM

## 2016-12-11 DIAGNOSIS — R590 Localized enlarged lymph nodes: Secondary | ICD-10-CM | POA: Diagnosis not present

## 2016-12-11 DIAGNOSIS — R944 Abnormal results of kidney function studies: Secondary | ICD-10-CM

## 2016-12-11 DIAGNOSIS — R531 Weakness: Secondary | ICD-10-CM | POA: Diagnosis not present

## 2016-12-11 DIAGNOSIS — Z85828 Personal history of other malignant neoplasm of skin: Secondary | ICD-10-CM

## 2016-12-11 DIAGNOSIS — R062 Wheezing: Secondary | ICD-10-CM | POA: Diagnosis not present

## 2016-12-11 DIAGNOSIS — Z79899 Other long term (current) drug therapy: Secondary | ICD-10-CM | POA: Diagnosis not present

## 2016-12-11 DIAGNOSIS — E871 Hypo-osmolality and hyponatremia: Secondary | ICD-10-CM | POA: Diagnosis not present

## 2016-12-11 DIAGNOSIS — D696 Thrombocytopenia, unspecified: Secondary | ICD-10-CM | POA: Diagnosis not present

## 2016-12-11 DIAGNOSIS — Z5111 Encounter for antineoplastic chemotherapy: Secondary | ICD-10-CM | POA: Diagnosis not present

## 2016-12-11 DIAGNOSIS — D649 Anemia, unspecified: Secondary | ICD-10-CM

## 2016-12-11 DIAGNOSIS — R0902 Hypoxemia: Secondary | ICD-10-CM | POA: Diagnosis not present

## 2016-12-11 DIAGNOSIS — J449 Chronic obstructive pulmonary disease, unspecified: Secondary | ICD-10-CM

## 2016-12-11 DIAGNOSIS — Z87891 Personal history of nicotine dependence: Secondary | ICD-10-CM | POA: Diagnosis not present

## 2016-12-11 DIAGNOSIS — N2889 Other specified disorders of kidney and ureter: Secondary | ICD-10-CM

## 2016-12-11 DIAGNOSIS — R110 Nausea: Secondary | ICD-10-CM

## 2016-12-11 DIAGNOSIS — L509 Urticaria, unspecified: Secondary | ICD-10-CM | POA: Diagnosis not present

## 2016-12-11 DIAGNOSIS — C3490 Malignant neoplasm of unspecified part of unspecified bronchus or lung: Secondary | ICD-10-CM

## 2016-12-11 DIAGNOSIS — J189 Pneumonia, unspecified organism: Secondary | ICD-10-CM

## 2016-12-11 DIAGNOSIS — R21 Rash and other nonspecific skin eruption: Secondary | ICD-10-CM | POA: Diagnosis not present

## 2016-12-11 DIAGNOSIS — R05 Cough: Secondary | ICD-10-CM | POA: Diagnosis not present

## 2016-12-11 DIAGNOSIS — C3431 Malignant neoplasm of lower lobe, right bronchus or lung: Secondary | ICD-10-CM | POA: Diagnosis present

## 2016-12-11 DIAGNOSIS — I10 Essential (primary) hypertension: Secondary | ICD-10-CM | POA: Diagnosis not present

## 2016-12-11 DIAGNOSIS — E7800 Pure hypercholesterolemia, unspecified: Secondary | ICD-10-CM

## 2016-12-11 DIAGNOSIS — Z7189 Other specified counseling: Secondary | ICD-10-CM

## 2016-12-11 DIAGNOSIS — R000 Tachycardia, unspecified: Secondary | ICD-10-CM | POA: Diagnosis not present

## 2016-12-11 DIAGNOSIS — E860 Dehydration: Secondary | ICD-10-CM

## 2016-12-11 LAB — FERRITIN: Ferritin: 685 ng/mL — ABNORMAL HIGH (ref 11–307)

## 2016-12-11 LAB — CBC WITH DIFFERENTIAL/PLATELET
Basophils Absolute: 0 10*3/uL (ref 0–0.1)
Basophils Relative: 0 %
Eosinophils Absolute: 0 10*3/uL (ref 0–0.7)
Eosinophils Relative: 0 %
HCT: 33.1 % — ABNORMAL LOW (ref 35.0–47.0)
Hemoglobin: 10.8 g/dL — ABNORMAL LOW (ref 12.0–16.0)
Lymphocytes Relative: 3 %
Lymphs Abs: 0.5 10*3/uL — ABNORMAL LOW (ref 1.0–3.6)
MCH: 30.7 pg (ref 26.0–34.0)
MCHC: 32.6 g/dL (ref 32.0–36.0)
MCV: 94.1 fL (ref 80.0–100.0)
Monocytes Absolute: 0.8 10*3/uL (ref 0.2–0.9)
Monocytes Relative: 4 %
Neutro Abs: 18.9 10*3/uL — ABNORMAL HIGH (ref 1.4–6.5)
Neutrophils Relative %: 93 %
Platelets: 228 10*3/uL (ref 150–440)
RBC: 3.51 MIL/uL — ABNORMAL LOW (ref 3.80–5.20)
RDW: 15.6 % — ABNORMAL HIGH (ref 11.5–14.5)
WBC: 20.3 10*3/uL — ABNORMAL HIGH (ref 3.6–11.0)

## 2016-12-11 LAB — IRON AND TIBC
Iron: 62 ug/dL (ref 28–170)
Saturation Ratios: 26 % (ref 10.4–31.8)
TIBC: 241 ug/dL — ABNORMAL LOW (ref 250–450)
UIBC: 179 ug/dL

## 2016-12-11 LAB — RETICULOCYTES
RBC.: 3.55 MIL/uL — ABNORMAL LOW (ref 3.80–5.20)
Retic Count, Absolute: 127.8 10*3/uL (ref 19.0–183.0)
Retic Ct Pct: 3.6 % — ABNORMAL HIGH (ref 0.4–3.1)

## 2016-12-11 LAB — FOLATE: Folate: 7.8 ng/mL (ref >5.9)

## 2016-12-11 LAB — VITAMIN B12: Vitamin B-12: 438 pg/mL (ref 180–914)

## 2016-12-11 NOTE — Progress Notes (Signed)
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     ------------------------------     PCT level increase compared          PCT > 0.5 ng/mL         with peak PCT AND          PCT >= 0.5 ng/mL             Escalation of antibiotics                                          strongly encouraged.      Escalation of antibiotics        strongly encouraged.   Reticulocytes     Status: Abnormal   Collection Time: 12/11/16 11:56 AM  Result Value Ref Range   Retic Ct Pct 3.6 (H) 0.4 - 3.1 %   RBC. 3.55 (L) 3.80 - 5.20 MIL/uL   Retic Count, Absolute 127.8 19.0 - 183.0 K/uL    No visits with results within 3 Day(s) from this visit.  Latest known visit with results is:   Admission on 12/14/2015, Discharged on 12/17/2015  Component Date Value Ref Range Status  . ABO/RH(D) 12/14/2015 O NEG   Final  . SURGICAL PATHOLOGY 12/21/2015    Final-Edited                   Value:Surgical Pathology  THIS IS AN ADDENDUM REPORT CASE: [id number] PATIENT: Czarina Alamillo Surgical Pathology Report Addendum  Reason for Addendum #1:  Immunohistochemistry results  SPECIMEN SUBMITTED: A. Lung, right middle lobe, wedge resection B. Pleural; biopsy C. Lung, right middle lobe, wedge resection  CLINICAL HISTORY: None provided  PRE-OPERATIVE DIAGNOSIS: Lung mass  POST-OPERATIVE DIAGNOSIS: Same as pre-op     DIAGNOSIS: A. LUNG, RIGHT MIDDLE LOBE; WEDGE RESECTION: - ADENOCARCINOMA, LEPIDIC (80%), PAPILLARY (10%), AND SOLID (10%) PATTERNS, SEE COMMENT.  B. PLEURA; BIOPSY: - FIBRINOUS AND ORGANIZING PLEURITIS. - NEGATIVE FOR MALIGNANCY.  C. LUNG, RIGHT MIDDLE LOBE; WEDGE RESECTION: - ADENOCARCINOMA, LEPIDIC, PAPILLARY, AND SOLID PATTERNS.  Comment: Parts A and C represent two halves of one wedge resection, bisected through the mass and submitted separately. There are at least two separate foci of carcinoma                          in this sample. No visceral pleural invasion is seen. Immunohistochemistry was performed for TTF-1, which outlines pre-existing alveoli occupied by tumor cells, but  does not show definite staining of tumor cells. However, this does not rule out lung origin in this case, as some pulmonary adenocarcinomas are TTF-1 negative.  The patient is known to have multifocal disease which is clinically of lung origin. The specimen was obtained for the purposes of diagnosis and ancillary testing only. For this reason, a cancer checklist is not provided, and staging should be based on imaging findings.  There is adequate tissue for ancillary studies.  The diagnosis was discussed with Dr. Genevive Bi on 12/16/15.   GROSS DESCRIPTION: A.  Intraoperative Consultation:     Received: fresh     Specimen: right middle lobe     Pathologic Evaluation: frozen section and touch prep     Diagnosis: Lung, right middle lobe; wedge biopsy:     - Positive for adenocarcinoma.     Communicated to: Dr.                          Genevive Bi at 12:04 PM on 12/14/2015, Bryan Lemma M.D.     Tissue submitted: A1     Note: Frozen section is en face of surface marked with suture, with visible mass A. Labeled: right middle lobe Type of procedure: wedge biopsy Laterality: right Weight of specimen: 2 grams Size of specimen: 2.8 x 2.5 x 1.1 cm Orientation: 1 suture and a 3.5 cm long staple line Specimen integrity: open surface adjacent to the suture  Ink: open aspect with suture-green; pleura-blue; margin after removal of staple line-yellow Number of masses: 1 Size(s) of mass(es): 0.4 x 0.4 x 0.3 cm Location of mass(es): at suture on open aspect of specimen, adjacent to pleura Description of mass: ill-defined tan Relationship of mass to bronchus: not applicable Margins: Tumor is at open surface Relationship/distance of mass(es) to pleura: abutting Description of remainder of lung: crepitant pink-tan  Block summary: 1-frozen section remnant 2-3-remaining nodule 4-remaining tissue  B. Labeled: pleural                          biopsy Tissue fragment(s): multiple Size: aggregate, 2.4 x 1.0 x 0.2 cm Description: fibrous pink-tan fragments  Entirely submitted in 1 cassette(s).   C. Labeled: adenocarcinoma of right middle lobe Type of procedure: wedge Laterality: right Weight of specimen: 2 grams Size of specimen: 4.2 x 1.5 x 1.0 cm Orientation: 4.1 cm long staple line Specimen integrity: one edge open Ink: open edge-green; pleura-blue; parenchymal margin beneath staple line-yellow Number of masses: 1 Size(s) of mass: 0.4 x 0.4 x 0.2 cm Location of mass: at the open edge Description of mass: ill-defined tan Relationship of mass to  bronchus: not applicable Margins: 0.1 from parenchyma adjacent to staple line Relationship/distance of mass to pleura: abutting Description of remainder of lung: crepitant red  Block summary: 1-mass 2-3-remaining tissue   Final Diagnosis performed by Bryan Lemma, MD.  Electronically signed 12/17/2015 1:58:36PM   The electronic signature indicates that                          the named Attending Pathologist has evaluated the specimen  Technical component performed at [facility] [facility], [address] [place], [place] [address] Lab: [phone] Dir: Darrick Penna. Evette Doffing, MD  Professional component performed at [facility], [facility], [place], [place], [place] [address] Lab: [phone] Dir: Dellia Nims. Reuel Derby, MD   ADDENDUM: Immunohistochemistry was performed for Napsin A. Most of the  neoplastic cells are negative and a few show weak staining. Interpretation is difficult in some areas because of abundant alveolar macrophages showing strong staining. The results are indeterminate with regard to tissue of origin. Addendum #1 performed by Bryan Lemma, MD.  Electronically signed 12/21/2015 2:22:53PM    Technical component performed at [place], [address], [place], [place] [address] Lab: [phone] Dir: Darrick Penna. Evette Doffing, MD  Professional component performed at [facility], [facility], [address] [place], [place], [place] [address] Lab: [phone] Dir: Dellia Nims. Rubinas, MD    . Glucose-Capillary 12/15/2015 151* 65 - 99 mg/dL Final  . Glucose-Capillary 12/16/2015 127* 65 - 99 mg/dL Final    Assessment:  Christina Sharp is a 71 y.o. 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  Bronchoscopy on 09/22/2015 revealed mild to moderate chronic bronchitis changes with no endobronchial lesions.  Brushings from the right lower lobe were negative for malignancy.  Repeat bronchoscopy on 11/22/2015 was performed.  An  EBUS scope was unable to be passed in the RML.  Transbronchial needle aspirations of a lesion were performed in the medial segment of the right middle lobe.  Pathology revealed atypical cells.  CEA and LDH were normal on 11/23/2015.  She underwent video assisted thoracoscopy (VATS) on 12/14/2015.  On the undersurface of the right middle lobe there wasa palpable mass.  Pathology from the right middle lobe wedge resection revealed adenocarcinoma, lepidic (80%), papillary (10%), and solid (10%).  Pleural biopsy was negative for malignancy.  EGFR, ALK, ROS1, BRAF were negative.  PDL-1 was 5%.  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  Tumor was microsatellite stable.  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  There were no alterations in EGFR, ALK, BRAF, MET, ERBB2, and ROS1.  PET scan on 11/21/2015 revealed a 2.9 x 3 cm central right lower lobe lung lesion (SUV 7.0).  Areas of surrounding more patchy right lower lobe opacity were also suspicious for metastasis or synchronous primaries. There were bilateral pulmonary nodules, including hypermetabolic right upper and right lower lobe pulmonary nodules suspicious for metastatic disease.  There was no thoracic nodal hypermetabolism.  There was an area of soft tissue fullness within the high left neck suspicious for an enlarged lateral retropharyngeal node (? skip nodal metastasis).  There was indeterminate right renal lesion.  Clinical stage is T3N0M1.  Head MRI on 12/31/2015 revealed atrophy and small vessel disease.  There were no acute intracranial findings or intracranial metastatic disease.  There was suspected metastatic disease to a 1.2 x 2.3 cmleft lateral retropharyngeal node which displaced the left ICA medially.  Neck, chest, abdomen, and pelvic CT scan on 02/09/2016 revealed a 1.3 x 2.6 cm lateral retropharyngeal  lymph node (stable), increase in size of nodules in the lingula and LLL and persistent nodular consolidation in the RLL.  Mediastinal adenopathy was stable.  Pretreatment labs included a TSH 0.278 (low), free T4 1.0 (normal), and ACTH < 1.1 (low).  ACTH was inadvertantly drawn the morning after receiving Decadron.  Follow-up early am cortisol on 02/07/2016 was 20.3 (6.7-22.6).    She received 4 cycles of carboplatin, Alimta, and Keytruda (02/03/2016 - 04/06/2016).   PET scan on 04/24/2016 revealed new and enlarging bilateral pulmonary nodules compared to the prior exam,  hypermetabolic, compatible with metastatic disease in the lungs.  There was low-grade metabolic activity in mediastinal lymph nodes. There continues to be a nodular structure favoring a neck lymph node in the upper left station IIa region, mildly hypermetabolic, likely reflecting a metastatic lymph node.  There was no evidence of  She received 3 cycles of Taxotere (05/25/2016 - 07/06/2016).  She tolerated her chemotherapy well.  Counts were good.  She enrolled on Lycera clinical trial of [phone] (ROR gamma agonist).  She received 28 day cycles x 2 (09/07/2016 - 11/01/2016) at [facility]. Clinical trial was discontinued on 11/02/2016 secondary to progression.  Chest, abdomen, and pelvic CT at [facility] on 11/02/2016 revealed interval increased size of multiple bilateral heterogeneous consolidative opcacities most c/w disease progresion.  There was stable mediastinal adenopathy.  There was a stable hyperattenuating lesion of the gallbladder fundus.  She is s/p 1 cycle of gemcitabine (11/23/2016 - 11/30/2016).  She was admitted to [facility] from 12/07/2016 - 1112/2018 with pneumonia.  She was treated with aztreonam and discharged on Levaquin.  She also received Solumedrol, Budesonide and Duoneb SVN treatments. She was discharged on 2.5 liters oxygen via [place].    Chest CT on 12/07/2016 revealed significant interval worsening aeration of both lungs.  Findings were compatible with marked progression of multifocal bilateral pulmonary adenocarcinoma. Findings were also compatible with progression of disease with superimposed multifocal bilateral infection.  There was progression of mediastinal adenopathy.  Symptomatically, patient continues to be short of breath. She is being treated for pneumonia with a course of Levaquin. She is on supplemental oxygen at 2.5L/[place].   Plan: 1.  Labs today:  CBC with diff, ferritin, iron studies, B12 folate, retic. 2.  Discuss interval scans suggesting progression of disease with superimposed pneumonia.  Imaging reviewed with patient.  Discuss comparing films with CT scans at [facility]. 3.  Phone follow-up with Dr. Aniceto Boss  re: clinical trials. 4.  Discuss long term disability.  Discuss code status. 5.  RTC in 1 week for MD assessment and labs (CBC with diff, CMP).    Honor Loh, NP  12/11/2016, 12:56 PM   I saw and evaluated the patient, participating in the key portions of the service and reviewing pertinent diagnostic studies and records.  I reviewed the nurse practitioner's note and agree with the findings and the plan.  The assessment and plan were discussed with the patient.  Multiple questions were asked by the patient and answered.   Nolon Stalls, MD 12/11/2016,12:56 PM

## 2016-12-11 NOTE — Progress Notes (Signed)
Pt released from hospital yesterday. Dr Mike Gip requested pt to come into clinic to be seen today.  Pt wearing oxygen 3l/m per nasal cannula.

## 2016-12-11 NOTE — Telephone Encounter (Signed)
Per Bryan-PA it was ok to add this patient on to [place] on 12/18/16  @ 8:45  Schedule per 12/11/16 los.

## 2016-12-11 NOTE — Telephone Encounter (Signed)
Called patient and made her aware of her appts for lab/MD per 12/11/16 los. Also informed patient that a copy of her schedule for her appts for 12/18/16 was mailed out to her.

## 2016-12-12 ENCOUNTER — Ambulatory Visit
Admission: RE | Admit: 2016-12-12 | Discharge: 2016-12-12 | Disposition: A | Discharge status: Home / Self Care | Payer: Self-pay | Source: Ambulatory Visit | Attending: Oncology | Admitting: Oncology

## 2016-12-12 ENCOUNTER — Other Ambulatory Visit: Payer: Self-pay | Admitting: Oncology

## 2016-12-12 DIAGNOSIS — C3400 Malignant neoplasm of unspecified main bronchus: Secondary | ICD-10-CM

## 2016-12-12 LAB — CULTURE, BLOOD (ROUTINE X 2)
CULTURE: NO GROWTH
Culture: NO GROWTH
Special Requests: ADEQUATE

## 2016-12-14 ENCOUNTER — Inpatient Hospital Stay: Payer: BLUE CROSS/BLUE SHIELD

## 2016-12-16 ENCOUNTER — Encounter: Payer: Self-pay | Admitting: Hematology and Oncology

## 2016-12-18 ENCOUNTER — Inpatient Hospital Stay: Payer: BLUE CROSS/BLUE SHIELD

## 2016-12-18 ENCOUNTER — Ambulatory Visit
Admission: RE | Admit: 2016-12-18 | Discharge: 2016-12-18 | Disposition: A | Discharge status: Home / Self Care | Payer: BLUE CROSS/BLUE SHIELD | Source: Ambulatory Visit | Attending: Hematology and Oncology | Admitting: Hematology and Oncology

## 2016-12-18 ENCOUNTER — Inpatient Hospital Stay (HOSPITAL_BASED_OUTPATIENT_CLINIC_OR_DEPARTMENT_OTHER): Payer: BLUE CROSS/BLUE SHIELD | Admitting: Hematology and Oncology

## 2016-12-18 ENCOUNTER — Other Ambulatory Visit: Payer: Self-pay

## 2016-12-18 ENCOUNTER — Encounter: Payer: Self-pay | Admitting: Hematology and Oncology

## 2016-12-18 VITALS — BP 122/71 | HR 120 | Temp 97.5°F | Resp 20 | Wt 137.4 lb

## 2016-12-18 DIAGNOSIS — R05 Cough: Secondary | ICD-10-CM

## 2016-12-18 DIAGNOSIS — R5383 Other fatigue: Secondary | ICD-10-CM

## 2016-12-18 DIAGNOSIS — C3431 Malignant neoplasm of lower lobe, right bronchus or lung: Secondary | ICD-10-CM

## 2016-12-18 DIAGNOSIS — J189 Pneumonia, unspecified organism: Secondary | ICD-10-CM

## 2016-12-18 DIAGNOSIS — C3490 Malignant neoplasm of unspecified part of unspecified bronchus or lung: Secondary | ICD-10-CM

## 2016-12-18 DIAGNOSIS — E860 Dehydration: Secondary | ICD-10-CM | POA: Diagnosis not present

## 2016-12-18 DIAGNOSIS — D696 Thrombocytopenia, unspecified: Secondary | ICD-10-CM

## 2016-12-18 DIAGNOSIS — R000 Tachycardia, unspecified: Secondary | ICD-10-CM | POA: Diagnosis not present

## 2016-12-18 DIAGNOSIS — R0902 Hypoxemia: Secondary | ICD-10-CM

## 2016-12-18 DIAGNOSIS — E7800 Pure hypercholesterolemia, unspecified: Secondary | ICD-10-CM

## 2016-12-18 DIAGNOSIS — Z79899 Other long term (current) drug therapy: Secondary | ICD-10-CM

## 2016-12-18 DIAGNOSIS — E871 Hypo-osmolality and hyponatremia: Secondary | ICD-10-CM | POA: Diagnosis not present

## 2016-12-18 DIAGNOSIS — Z87891 Personal history of nicotine dependence: Secondary | ICD-10-CM

## 2016-12-18 DIAGNOSIS — R110 Nausea: Secondary | ICD-10-CM

## 2016-12-18 DIAGNOSIS — N2889 Other specified disorders of kidney and ureter: Secondary | ICD-10-CM

## 2016-12-18 DIAGNOSIS — R590 Localized enlarged lymph nodes: Secondary | ICD-10-CM

## 2016-12-18 DIAGNOSIS — D649 Anemia, unspecified: Secondary | ICD-10-CM

## 2016-12-18 DIAGNOSIS — J449 Chronic obstructive pulmonary disease, unspecified: Secondary | ICD-10-CM

## 2016-12-18 DIAGNOSIS — Z85828 Personal history of other malignant neoplasm of skin: Secondary | ICD-10-CM

## 2016-12-18 DIAGNOSIS — R944 Abnormal results of kidney function studies: Secondary | ICD-10-CM

## 2016-12-18 DIAGNOSIS — R531 Weakness: Secondary | ICD-10-CM

## 2016-12-18 DIAGNOSIS — R9431 Abnormal electrocardiogram [ECG] [EKG]: Secondary | ICD-10-CM | POA: Insufficient documentation

## 2016-12-18 DIAGNOSIS — I10 Essential (primary) hypertension: Secondary | ICD-10-CM

## 2016-12-18 LAB — CBC WITH DIFFERENTIAL/PLATELET
Basophils Absolute: 0.1 10*3/uL (ref 0–0.1)
Basophils Relative: 1 %
Eosinophils Absolute: 0.1 10*3/uL (ref 0–0.7)
Eosinophils Relative: 1 %
HCT: 33 % — ABNORMAL LOW (ref 35.0–47.0)
Hemoglobin: 11 g/dL — ABNORMAL LOW (ref 12.0–16.0)
Lymphocytes Relative: 7 %
Lymphs Abs: 0.9 10*3/uL — ABNORMAL LOW (ref 1.0–3.6)
MCH: 31.9 pg (ref 26.0–34.0)
MCHC: 33.3 g/dL (ref 32.0–36.0)
MCV: 95.6 fL (ref 80.0–100.0)
Monocytes Absolute: 1.4 10*3/uL — ABNORMAL HIGH (ref 0.2–0.9)
Monocytes Relative: 12 %
Neutro Abs: 9.4 10*3/uL — ABNORMAL HIGH (ref 1.4–6.5)
Neutrophils Relative %: 79 %
Platelets: 468 10*3/uL — ABNORMAL HIGH (ref 150–440)
RBC: 3.45 MIL/uL — ABNORMAL LOW (ref 3.80–5.20)
RDW: 17.3 % — ABNORMAL HIGH (ref 11.5–14.5)
WBC: 11.9 10*3/uL — ABNORMAL HIGH (ref 3.6–11.0)

## 2016-12-18 LAB — COMPREHENSIVE METABOLIC PANEL
ALT: 29 U/L (ref 14–54)
AST: 23 U/L (ref 15–41)
Albumin: 3.3 g/dL — ABNORMAL LOW (ref 3.5–5.0)
Alkaline Phosphatase: 123 U/L (ref 38–126)
Anion gap: 10 (ref 5–15)
BUN: 13 mg/dL (ref 6–20)
CO2: 22 mmol/L (ref 22–32)
Calcium: 8.9 mg/dL (ref 8.9–10.3)
Chloride: 104 mmol/L (ref 101–111)
Creatinine, Ser: 1.04 mg/dL — ABNORMAL HIGH (ref 0.44–1.00)
GFR calc Af Amer: >60 mL/min (ref >60)
GFR calc non Af Amer: 52 mL/min — ABNORMAL LOW (ref >60)
Glucose, Bld: 115 mg/dL — ABNORMAL HIGH (ref 65–99)
Potassium: 3.8 mmol/L (ref 3.5–5.1)
Sodium: 136 mmol/L (ref 135–145)
Total Bilirubin: 0.7 mg/dL (ref 0.3–1.2)
Total Protein: 7 g/dL (ref 6.5–8.1)

## 2016-12-18 MED ORDER — METOPROLOL TARTRATE 25 MG PO TABS: 25.0000 mg | ORAL_TABLET | Freq: Four times a day (QID) | ORAL | 0 refills | Status: DC | PRN

## 2016-12-18 NOTE — Progress Notes (Signed)
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               PCT < 0.25 ng/mL       >= 80% from peak PCT       OR PCT 0.25 - 0.5 ng/mL          Stopping of antibiotics                                             encouraged.     Stopping of antibiotics           encouraged.    ----------------------------     ------------------------------       PCT level decrease by              PCT >= 0.25 ng/mL       < 80% from peak PCT        AND PCT >= 0.5 ng/mL            Continuin g antibiotics                                              encouraged.       Continuing antibiotics            encouraged.    ----------------------------     ------------------------------     PCT level increase compared          PCT > 0.5 ng/mL          with peak PCT AND          PCT >= 0.5 ng/mL             Escalation of antibiotics                                          strongly encouraged.      Escalation of antibiotics        strongly encouraged.   Reticulocytes     Status: Abnormal   Collection Time: 12/11/16 11:56 AM  Result Value Ref Range   Retic Ct Pct 3.6 (H) 0.4 - 3.1 %   RBC. 3.55 (L) 3.80 - 5.20 MIL/uL   Retic Count, Absolute 127.8 19.0 - 183.0 K/uL  Vitamin B12     Status: None   Collection Time: 12/11/16 11:56 AM  Result Value Ref Range   Vitamin B-12 438 180 - 914 pg/mL    Comment: (NOTE) This assay is not validated for testing neonatal or myeloproliferative syndrome specimens for Vitamin B12 levels. Performed at [facility], [place] [address] [place], [place] [address]   Folate     Status: None   Collection Time: 12/11/16 11:56 AM  Result Value Ref Range   Folate 7.8 >5.9 ng/mL  Iron and TIBC     Status: Abnormal   Collection Time: 12/11/16 11:56 AM  Result Value Ref Range   Iron 62 28 - 170 ug/dL   TIBC 241 (L)  250 - 450 ug/dL   Saturation Ratios 26 10.4 - 31.8 %   UIBC 179 ug/dL  Ferritin     Status: Abnormal   Collection Time: 12/11/16 11:56 AM  Result Value Ref Range   Ferritin 685 (H) 11 - 307 ng/mL  CBC with Differential     Status: Abnormal   Collection Time: 12/11/16  1:09 PM  Result Value Ref Range   WBC 20.3 (H) 3.6 - 11.0 K/uL   RBC 3.51 (L) 3.80 - 5.20 MIL/uL   Hemoglobin 10.8 (L) 12.0 - 16.0 g/dL   HCT 33.1 (L) 35.0 - 47.0 %   MCV 94.1 80.0 - 100.0 fL   MCH 30.7 26.0 - 34.0 pg   MCHC 32.6 32.0 - 36.0 g/dL   RDW 15.6 (H) 11.5 - 14.5 %   Platelets 228 150 - 440 K/uL   Neutrophils Relative % 93 %   Neutro Abs 18.9 (H) 1.4 - 6.5 K/uL   Lymphocytes Relative 3 %   Lymphs Abs 0.5 (L) 1.0 - 3.6 K/uL   Monocytes Relative 4 %   Monocytes Absolute 0.8 0.2 - 0.9 K/uL   Eosinophils Relative 0 %   Eosinophils Absolute 0.0 0 - 0.7 K/uL   Basophils Relative 0 %   Basophils Absolute 0.0 0  - 0.1 K/uL  CBC with Differential     Status: Abnormal   Collection Time: 12/18/16  8:40 AM  Result Value Ref Range   WBC 11.9 (H) 3.6 - 11.0 K/uL   RBC 3.45 (L) 3.80 - 5.20 MIL/uL   Hemoglobin 11.0 (L) 12.0 - 16.0 g/dL   HCT 33.0 (L) 35.0 - 47.0 %   MCV 95.6 80.0 - 100.0 fL   MCH 31.9 26.0 - 34.0 pg   MCHC 33.3 32.0 - 36.0 g/dL   RDW 17.3 (H) 11.5 - 14.5 %   Platelets 468 (H) 150 - 440 K/uL   Neutrophils Relative % 79 %   Neutro Abs 9.4 (H) 1.4 - 6.5 K/uL   Lymphocytes Relative 7 %   Lymphs Abs 0.9 (L) 1.0 - 3.6 K/uL   Monocytes Relative 12 %   Monocytes Absolute 1.4 (H) 0.2 - 0.9 K/uL   Eosinophils Relative 1 %   Eosinophils Absolute 0.1 0 - 0.7 K/uL   Basophils Relative 1 %   Basophils Absolute 0.1 0 - 0.1 K/uL  Comprehensive metabolic panel     Status: Abnormal   Collection Time: 12/18/16  8:40 AM  Result Value Ref Range   Sodium 136 135 - 145 mmol/L   Potassium 3.8 3.5 - 5.1 mmol/L   Chloride 104 101 - 111 mmol/L   CO2 22 22 - 32 mmol/L   Glucose, Bld 115 (H) 65 - 99 mg/dL   BUN 13 6 - 20 mg/dL   Creatinine, Ser 1.04 (H) 0.44 - 1.00 mg/dL   Calcium 8.9 8.9 - 10.3 mg/dL   Total Protein 7.0 6.5 - 8.1 g/dL   Albumin 3.3 (L) 3.5 - 5.0 g/dL   AST 23 15 - 41 U/L   ALT 29 14 - 54 U/L   Alkaline Phosphatase 123 38 - 126 U/L   Total Bilirubin 0.7 0.3 - 1.2 mg/dL   GFR calc non Af Amer 52 (L) >60 mL/min   GFR calc Af Amer >60 >60 mL/min    Comment: (NOTE) The eGFR has been calculated using the CKD EPI equation. This calculation has not been validated in all clinical situations. eGFR's persistently <60 mL/min signify possible  Chronic Kidney Disease.    Anion gap 10 5 - 15    No visits with results within 3 Day(s) from this visit.  Latest known visit with results is:  Admission on 12/14/2015, Discharged on 12/17/2015  Component Date Value Ref Range Status  . ABO/RH(D) 12/14/2015 O NEG   Final  . SURGICAL PATHOLOGY 12/21/2015    Final-Edited                    Value:Surgical Pathology  THIS IS AN ADDENDUM REPORT CASE: [id number] PATIENT: Mareli Thompson Surgical Pathology Report Addendum  Reason for Addendum #1:  Immunohistochemistry results  SPECIMEN SUBMITTED: A. Lung, right middle lobe, wedge resection B. Pleural; biopsy C. Lung, right middle lobe, wedge resection  CLINICAL HISTORY: None provided  PRE-OPERATIVE DIAGNOSIS: Lung mass  POST-OPERATIVE DIAGNOSIS: Same as pre-op     DIAGNOSIS: A. LUNG, RIGHT MIDDLE LOBE; WEDGE RESECTION: - ADENOCARCINOMA, LEPIDIC (80%), PAPILLARY (10%), AND SOLID (10%) PATTERNS, SEE COMMENT.  B. PLEURA; BIOPSY: - FIBRINOUS AND ORGANIZING PLEURITIS. - NEGATIVE FOR MALIGNANCY.  C. LUNG, RIGHT MIDDLE LOBE; WEDGE RESECTION: - ADENOCARCINOMA, LEPIDIC, PAPILLARY, AND SOLID PATTERNS.  Comment: Parts A and C represent two halves of one wedge resection, bisected through the mass and submitted separately. There are at least two separate foci of carcinoma                          in this sample. No visceral pleural invasion is seen. Immunohistochemistry was performed for TTF-1, which outlines pre-existing alveoli occupied by tumor cells, but does not show definite staining of tumor cells. However, this does not rule out lung origin in this case, as some pulmonary adenocarcinomas are TTF-1 negative.  The patient is known to have multifocal disease which is clinically of lung origin. The specimen was obtained for the purposes of diagnosis and ancillary testing only. For this reason, a cancer checklist is not provided, and staging should be based on imaging findings.  There is adequate tissue for ancillary studies.  The diagnosis was discussed with Dr. Genevive Bi on 12/16/15.   GROSS DESCRIPTION: A. Intraoperative Consultation:     Received: fresh     Specimen: right middle lobe     Pathologic Evaluation: frozen section and touch prep     Diagnosis: Lung, right middle lobe; wedge biopsy:     -  Positive for adenocarcinoma.     Communicated to: Dr.                          Genevive Bi at 12:04 PM on 12/14/2015, Bryan Lemma M.D.     Tissue submitted: A1     Note: Frozen section is en face of surface marked with suture, with visible mass A. Labeled: right middle lobe Type of procedure: wedge biopsy Laterality: right Weight of specimen: 2 grams Size of specimen: 2.8 x 2.5 x 1.1 cm Orientation: 1 suture and a 3.5 cm long staple line Specimen integrity: open surface adjacent to the suture  Ink: open aspect with suture-green; pleura-blue; margin after removal of staple line-yellow Number of masses: 1 Size(s) of mass(es): 0.4 x 0.4 x 0.3 cm Location of mass(es): at suture on open aspect of specimen, adjacent to pleura Description of mass: ill-defined tan Relationship of mass to bronchus: not applicable Margins: Tumor is at open surface Relationship/distance of mass(es) to pleura: abutting Description of remainder of lung: crepitant pink-tan  Block summary: 1-frozen section remnant  2-3-remaining nodule 4-remaining tissue  B. Labeled: pleural                          biopsy Tissue fragment(s): multiple Size: aggregate, 2.4 x 1.0 x 0.2 cm Description: fibrous pink-tan fragments  Entirely submitted in 1 cassette(s).   C. Labeled: adenocarcinoma of right middle lobe Type of procedure: wedge Laterality: right Weight of specimen: 2 grams Size of specimen: 4.2 x 1.5 x 1.0 cm Orientation: 4.1 cm long staple line Specimen integrity: one edge open Ink: open edge-green; pleura-blue; parenchymal margin beneath staple line-yellow Number of masses: 1 Size(s) of mass: 0.4 x 0.4 x 0.2 cm Location of mass: at the open edge Description of mass: ill-defined tan Relationship of mass to bronchus: not applicable Margins: 0.1 from parenchyma adjacent to staple line Relationship/distance of mass to pleura: abutting Description of remainder of lung: crepitant red  Block  summary: 1-mass 2-3-remaining tissue   Final Diagnosis performed by Bryan Lemma, MD.  Electronically signed 12/17/2015 1:58:36PM   The electronic signature indicates that                          the named Attending Pathologist has evaluated the specimen  Technical component performed at [facility], [address], [place], [place] [address] Lab: [phone] Dir: Darrick Penna. Evette Doffing, MD  Professional component performed at [facility], [place], [place], [place] [address] Lab: [phone] Dir: Dellia Nims. Reuel Derby, MD   ADDENDUM: Immunohistochemistry was performed for Napsin A. Most of the neoplastic cells are negative and a few show weak staining. Interpretation is difficult in some areas because of abundant alveolar macrophages showing strong staining. The results are indeterminate with regard to tissue of origin. Addendum #1 performed by Bryan Lemma, MD.  Electronically signed 12/21/2015 2:22:53PM    Technical component performed at [facility], [address], [place], [place] [address] Lab: [phone] Dir: Darrick Penna. Evette Doffing, MD  Professional component performed at [facility], [facility], [address] [place], [place], [place] [address] Lab: [phone] Dir: Dellia Nims. Rubinas, MD    . Glucose-Capillary 12/15/2015 151* 65 - 99 mg/dL Final  . Glucose-Capillary 12/16/2015 127* 65 - 99 mg/dL Final    Assessment:  Christina Sharp is a 71 y.o. 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  Bronchoscopy on 09/22/2015 revealed mild to moderate chronic bronchitis changes with no endobronchial lesions.  Brushings from the right lower lobe were negative for malignancy.  Repeat bronchoscopy on 11/22/2015 was performed.  An EBUS scope was unable to be passed in the RML.  Transbronchial needle aspirations of a lesion were performed in the medial segment of the right middle lobe.  Pathology revealed atypical  cells.  CEA and LDH were normal on 11/23/2015.  She underwent video assisted thoracoscopy (VATS) on 12/14/2015.  On the undersurface of the right middle lobe there wasa palpable mass.  Pathology from the right middle lobe wedge resection revealed adenocarcinoma, lepidic (80%), papillary (10%), and solid (10%).  Pleural biopsy was negative for malignancy.  EGFR, ALK, ROS1, BRAF were negative.  PDL-1 was 5%.  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  Tumor was microsatellite stable.  Tumor mutational burden was  TMB-intermediate (7 Muts/Mb).  There were no alterations in EGFR, ALK, BRAF, MET, ERBB2, and ROS1.  PET scan on 11/21/2015 revealed a 2.9 x 3 cm central right lower lobe lung lesion (SUV 7.0).  Areas of surrounding more patchy right lower lobe opacity were also suspicious for metastasis or synchronous primaries. There were bilateral pulmonary nodules, including hypermetabolic right upper and right lower lobe pulmonary nodules suspicious for metastatic disease.  There was no thoracic nodal hypermetabolism.  There was an area of soft tissue fullness within the high left neck suspicious for an enlarged lateral retropharyngeal node (? skip nodal metastasis).  There was indeterminate right renal lesion.  Clinical stage is T3N0M1.  Head MRI on 12/31/2015 revealed atrophy and small vessel disease.  There were no acute intracranial findings or intracranial metastatic disease.  There was suspected metastatic disease to a 1.2 x 2.3 cmleft lateral retropharyngeal node which displaced the left ICA medially.  Neck, chest, abdomen, and pelvic CT scan on 02/09/2016 revealed a 1.3 x 2.6 cm lateral retropharyngeal lymph node (stable), increase in size of nodules in the lingula and LLL and persistent nodular consolidation in the RLL.  Mediastinal adenopathy was stable.  Pretreatment labs  included a TSH 0.278 (low), free T4 1.0 (normal), and ACTH < 1.1 (low).  ACTH was inadvertantly drawn the morning after receiving Decadron.  Follow-up early am cortisol on 02/07/2016 was 20.3 (6.7-22.6).    She received 4 cycles of carboplatin, Alimta, and Keytruda (02/03/2016 - 04/06/2016).   PET scan on 04/24/2016 revealed new and enlarging bilateral pulmonary nodules compared to the prior exam, hypermetabolic, compatible with metastatic disease in the lungs.  There was low-grade metabolic activity in mediastinal lymph nodes. There continues to be a nodular structure favoring a neck lymph node in the upper left station IIa region, mildly hypermetabolic, likely reflecting a metastatic lymph node.  There was no evidence of  She received 3 cycles of Taxotere (05/25/2016 - 07/06/2016).  She tolerated her chemotherapy well.  Counts were good.  She enrolled on Lycera clinical trial of [phone] (ROR gamma agonist).  She received 28 day cycles x 2 (09/07/2016 - 11/01/2016) at [facility]. Clinical trial was discontinued on 11/02/2016 secondary to progression.  Chest, abdomen, and pelvic CT at [facility] on 11/02/2016 revealed interval increased size of multiple bilateral heterogeneous consolidative opcacities most c/w disease progresion.  There was stable mediastinal adenopathy.  There was a stable hyperattenuating lesion of the gallbladder fundus.  She is s/p 1 cycle of gemcitabine (11/23/2016 - 11/30/2016).  She was admitted to [facility] from 12/07/2016 - 1112/2018 with pneumonia.  She was treated with aztreonam and discharged on Levaquin.  She also received Solumedrol, Budesonide and Duoneb SVN treatments. She was discharged on 2.5 liters oxygen via [place].    Chest CT on 12/07/2016 revealed significant interval worsening aeration of both lungs. Findings were compatible with marked progression of multifocal bilateral pulmonary adenocarcinoma. Findings were also compatible with progression of disease with superimposed  multifocal bilateral infection.  There was progression of mediastinal adenopathy.  She has anemia of chronic disease.  Work-up on 12/11/2016 revealed a normal ferritin, iron studies, B12, and folate.  Symptomatically, she is breathing better.  She has completed antibiotics.  She is tachycardic off metoprolol.  She is on supplemental oxygen at 2.5L/[place].   Plan: 1.  Labs today:  CBC with diff, CMP. 2.  CXR (PA and lateral)- reassess pneumonia. 3.  EKG- tachycardia. 4.  Rx:  Metoprolol 25 mg po q 6 hrs prn.  If blood pressure remains normal, switch back to metoprolol XR 100 mg a day. 5.  Phone follow-up with Dr. Aniceto Boss- message left re: recent hospitalization, progression of disease and clinical trial. 6.  RTC in 1 week for MD assessment.  Addendum:  EKG revealed sinus tachycardia.  Rate 122.   Lequita Asal, MD  12/18/2016, 5:42 PM

## 2016-12-18 NOTE — Progress Notes (Signed)
Patient presents today in wheelchair.  She is on 3 L O2.  Upon arrival her O2 saturation was 86 on 2L.  She is now @ 89% on 3L.  HR Patient states she is eating and sleeping well.  Appetite is good.  No complaints of pain.  Dr. Bobbye Charleston took her off toprol.  HR today consistently 125-130.  O2 did not get above 90%.

## 2016-12-19 ENCOUNTER — Telehealth: Payer: Self-pay | Admitting: *Deleted

## 2016-12-19 NOTE — Telephone Encounter (Signed)
Patient called confused about appts for next week, she is down for lab/ doctor/ inf, and she said she doesn't think she is supposed to get labs or have chemotherapy, these were scheduled on 11/2, Please advise

## 2016-12-19 NOTE — Telephone Encounter (Signed)
  I will call the patient.  I haven't heard from [facility].  No chemo next week.  M

## 2016-12-22 NOTE — Progress Notes (Signed)
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       >= 80% from peak PCT       OR PCT 0.25 - 0.5 ng/mL          Stopping of antibiotics                                             encouraged.     Stopping of antibiotics           encouraged.    ----------------------------     ------------------------------       PCT level decrease by              PCT >= 0.25 ng/mL       < 80% from peak PCT        AND PCT >= 0.5 ng/mL            Continuin g antibiotics                                              encouraged.       Continuing antibiotics            encouraged.    ----------------------------     ------------------------------     PCT level increase compared          PCT > 0.5 ng/mL         with peak PCT AND          PCT >= 0.5 ng/mL              Escalation of antibiotics                                          strongly encouraged.      Escalation of antibiotics        strongly encouraged.   Reticulocytes     Status: Abnormal   Collection Time: 12/11/16 11:56 AM  Result Value Ref Range   Retic Ct Pct 3.6 (H) 0.4 - 3.1 %   RBC. 3.55 (L) 3.80 - 5.20 MIL/uL   Retic Count, Absolute 127.8 19.0 - 183.0 K/uL  Vitamin B12     Status: None   Collection Time: 12/11/16 11:56 AM  Result Value Ref Range   Vitamin B-12 438 180 - 914 pg/mL    Comment: (NOTE) This assay is not validated for testing neonatal or myeloproliferative syndrome specimens for Vitamin B12 levels. Performed at [facility], [place] [address], [place], [place] [address]   Folate     Status: None   Collection Time: 12/11/16 11:56 AM  Result Value Ref Range   Folate 7.8 >5.9 ng/mL  Iron and TIBC     Status: Abnormal   Collection Time: 12/11/16 11:56 AM  Result Value Ref Range   Iron 62 28 - 170 ug/dL   TIBC 241 (L) 250 - 450 ug/dL   Saturation Ratios 26 10.4 - 31.8 %   UIBC 179  ug/dL  Ferritin     Status: Abnormal   Collection Time: 12/11/16 11:56 AM  Result Value Ref Range   Ferritin 685 (H) 11 - 307 ng/mL  CBC with Differential     Status: Abnormal   Collection Time: 12/11/16  1:09 PM  Result Value Ref Range   WBC 20.3 (H) 3.6 - 11.0 K/uL   RBC 3.51 (L) 3.80 - 5.20 MIL/uL   Hemoglobin 10.8 (L) 12.0 - 16.0 g/dL   HCT 33.1 (L) 35.0 - 47.0 %   MCV 94.1 80.0 - 100.0 fL   MCH 30.7 26.0 - 34.0 pg   MCHC 32.6 32.0 - 36.0 g/dL   RDW 15.6 (H) 11.5 - 14.5 %   Platelets 228 150 - 440 K/uL   Neutrophils Relative % 93 %   Neutro Abs 18.9 (H) 1.4 - 6.5 K/uL   Lymphocytes Relative 3 %   Lymphs Abs 0.5 (L) 1.0 - 3.6 K/uL   Monocytes Relative 4 %   Monocytes Absolute 0.8 0.2 - 0.9 K/uL   Eosinophils Relative 0 %   Eosinophils Absolute 0.0 0 - 0.7 K/uL   Basophils Relative 0 %   Basophils Absolute 0.0 0 - 0.1 K/uL  CBC with Differential     Status: Abnormal    Collection Time: 12/18/16  8:40 AM  Result Value Ref Range   WBC 11.9 (H) 3.6 - 11.0 K/uL   RBC 3.45 (L) 3.80 - 5.20 MIL/uL   Hemoglobin 11.0 (L) 12.0 - 16.0 g/dL   HCT 33.0 (L) 35.0 - 47.0 %   MCV 95.6 80.0 - 100.0 fL   MCH 31.9 26.0 - 34.0 pg   MCHC 33.3 32.0 - 36.0 g/dL   RDW 17.3 (H) 11.5 - 14.5 %   Platelets 468 (H) 150 - 440 K/uL   Neutrophils Relative % 79 %   Neutro Abs 9.4 (H) 1.4 - 6.5 K/uL   Lymphocytes Relative 7 %   Lymphs Abs 0.9 (L) 1.0 - 3.6 K/uL   Monocytes Relative 12 %   Monocytes Absolute 1.4 (H) 0.2 - 0.9 K/uL   Eosinophils Relative 1 %   Eosinophils Absolute 0.1 0 - 0.7 K/uL   Basophils Relative 1 %   Basophils Absolute 0.1 0 - 0.1 K/uL  Comprehensive metabolic panel     Status: Abnormal   Collection Time: 12/18/16  8:40 AM  Result Value Ref Range   Sodium 136 135 - 145 mmol/L   Potassium 3.8 3.5 - 5.1 mmol/L   Chloride 104 101 - 111 mmol/L   CO2 22 22 - 32 mmol/L   Glucose, Bld 115 (H) 65 - 99 mg/dL   BUN 13 6 - 20 mg/dL   Creatinine, Ser 1.04 (H) 0.44 - 1.00 mg/dL   Calcium 8.9 8.9 - 10.3 mg/dL   Total Protein 7.0 6.5 - 8.1 g/dL   Albumin 3.3 (L) 3.5 - 5.0 g/dL   AST 23 15 - 41 U/L   ALT 29 14 - 54 U/L   Alkaline Phosphatase 123 38 - 126 U/L   Total Bilirubin 0.7 0.3 - 1.2 mg/dL   GFR calc non Af Amer 52 (L) >60 mL/min   GFR calc Af Amer >60 >60 mL/min    Comment: (NOTE) The eGFR has been calculated using the CKD EPI equation. This calculation has not been validated in all clinical situations. eGFR's persistently <60 mL/min signify possible Chronic Kidney Disease.    Anion gap 10 5 - 15  Magnesium  Status: None   Collection Time: 12/24/16  8:38 AM  Result Value Ref Range   Magnesium 2.0 1.7 - 2.4 mg/dL  Comprehensive metabolic panel     Status: Abnormal   Collection Time: 12/24/16  8:38 AM  Result Value Ref Range   Sodium 134 (L) 135 - 145 mmol/L   Potassium 3.9 3.5 - 5.1 mmol/L   Chloride 100 (L) 101 - 111 mmol/L   CO2 22 22 - 32  mmol/L   Glucose, Bld 183 (H) 65 - 99 mg/dL   BUN 16 6 - 20 mg/dL   Creatinine, Ser 1.10 (H) 0.44 - 1.00 mg/dL   Calcium 9.1 8.9 - 10.3 mg/dL   Total Protein 7.3 6.5 - 8.1 g/dL   Albumin 3.6 3.5 - 5.0 g/dL   AST 22 15 - 41 U/L   ALT 15 14 - 54 U/L   Alkaline Phosphatase 99 38 - 126 U/L   Total Bilirubin 1.1 0.3 - 1.2 mg/dL   GFR calc non Af Amer 49 (L) >60 mL/min   GFR calc Af Amer 57 (L) >60 mL/min    Comment: (NOTE) The eGFR has been calculated using the CKD EPI equation. This calculation has not been validated in all clinical situations. eGFR's persistently <60 mL/min signify possible Chronic Kidney Disease.    Anion gap 12 5 - 15  CBC with Differential     Status: Abnormal   Collection Time: 12/24/16  8:38 AM  Result Value Ref Range   WBC 14.7 (H) 3.6 - 11.0 K/uL   RBC 3.57 (L) 3.80 - 5.20 MIL/uL   Hemoglobin 11.3 (L) 12.0 - 16.0 g/dL   HCT 34.3 (L) 35.0 - 47.0 %   MCV 96.0 80.0 - 100.0 fL   MCH 31.8 26.0 - 34.0 pg   MCHC 33.1 32.0 - 36.0 g/dL   RDW 17.9 (H) 11.5 - 14.5 %   Platelets 487 (H) 150 - 440 K/uL   Neutrophils Relative % 88 %   Neutro Abs 12.8 (H) 1.4 - 6.5 K/uL   Lymphocytes Relative 5 %   Lymphs Abs 0.7 (L) 1.0 - 3.6 K/uL   Monocytes Relative 7 %   Monocytes Absolute 1.0 (H) 0.2 - 0.9 K/uL   Eosinophils Relative 0 %   Eosinophils Absolute 0.0 0 - 0.7 K/uL   Basophils Relative 0 %   Basophils Absolute 0.1 0 - 0.1 K/uL    No visits with results within 3 Day(s) from this visit.  Latest known visit with results is:  Admission on 12/14/2015, Discharged on 12/17/2015  Component Date Value Ref Range Status  . ABO/RH(D) 12/14/2015 O NEG   Final  . SURGICAL PATHOLOGY 12/21/2015    Final-Edited                   Value:Surgical Pathology  THIS IS AN ADDENDUM REPORT CASE: [id number] PATIENT: Oval Kennedy Surgical Pathology Report Addendum  Reason for Addendum #1:  Immunohistochemistry results  SPECIMEN SUBMITTED: A. Lung, right middle lobe, wedge  resection B. Pleural; biopsy C. Lung, right middle lobe, wedge resection  CLINICAL HISTORY: None provided  PRE-OPERATIVE DIAGNOSIS: Lung mass  POST-OPERATIVE DIAGNOSIS: Same as pre-op     DIAGNOSIS: A. LUNG, RIGHT MIDDLE LOBE; WEDGE RESECTION: - ADENOCARCINOMA, LEPIDIC (80%), PAPILLARY (10%), AND SOLID (10%) PATTERNS, SEE COMMENT.  B. PLEURA; BIOPSY: - FIBRINOUS AND ORGANIZING PLEURITIS. - NEGATIVE FOR MALIGNANCY.  C. LUNG, RIGHT MIDDLE LOBE; WEDGE RESECTION: - ADENOCARCINOMA, LEPIDIC, PAPILLARY, AND SOLID PATTERNS.  Comment: Parts A  and C represent two halves of one wedge resection, bisected through the mass and submitted separately. There are at least two separate foci of carcinoma                          in this sample. No visceral pleural invasion is seen. Immunohistochemistry was performed for TTF-1, which outlines pre-existing alveoli occupied by tumor cells, but does not show definite staining of tumor cells. However, this does not rule out lung origin in this case, as some pulmonary adenocarcinomas are TTF-1 negative.  The patient is known to have multifocal disease which is clinically of lung origin. The specimen was obtained for the purposes of diagnosis and ancillary testing only. For this reason, a cancer checklist is not provided, and staging should be based on imaging findings.  There is adequate tissue for ancillary studies.  The diagnosis was discussed with Dr. Genevive Bi on 12/16/15.   GROSS DESCRIPTION: A. Intraoperative Consultation:     Received: fresh     Specimen: right middle lobe     Pathologic Evaluation: frozen section and touch prep     Diagnosis: Lung, right middle lobe; wedge biopsy:     - Positive for adenocarcinoma.     Communicated to: Dr.                          Genevive Bi at 12:04 PM on 12/14/2015, Bryan Lemma M.D.     Tissue submitted: A1     Note: Frozen section is en face of surface marked with suture, with visible mass A. Labeled:  right middle lobe Type of procedure: wedge biopsy Laterality: right Weight of specimen: 2 grams Size of specimen: 2.8 x 2.5 x 1.1 cm Orientation: 1 suture and a 3.5 cm long staple line Specimen integrity: open surface adjacent to the suture  Ink: open aspect with suture-green; pleura-blue; margin after removal of staple line-yellow Number of masses: 1 Size(s) of mass(es): 0.4 x 0.4 x 0.3 cm Location of mass(es): at suture on open aspect of specimen, adjacent to pleura Description of mass: ill-defined tan Relationship of mass to bronchus: not applicable Margins: Tumor is at open surface Relationship/distance of mass(es) to pleura: abutting Description of remainder of lung: crepitant pink-tan  Block summary: 1-frozen section remnant 2-3-remaining nodule 4-remaining tissue  B. Labeled: pleural                          biopsy Tissue fragment(s): multiple Size: aggregate, 2.4 x 1.0 x 0.2 cm Description: fibrous pink-tan fragments  Entirely submitted in 1 cassette(s).   C. Labeled: adenocarcinoma of right middle lobe Type of procedure: wedge Laterality: right Weight of specimen: 2 grams Size of specimen: 4.2 x 1.5 x 1.0 cm Orientation: 4.1 cm long staple line Specimen integrity: one edge open Ink: open edge-green; pleura-blue; parenchymal margin beneath staple line-yellow Number of masses: 1 Size(s) of mass: 0.4 x 0.4 x 0.2 cm Location of mass: at the open edge Description of mass: ill-defined tan Relationship of mass to bronchus: not applicable Margins: 0.1 from parenchyma adjacent to staple line Relationship/distance of mass to pleura: abutting Description of remainder of lung: crepitant red  Block summary: 1-mass 2-3-remaining tissue   Final Diagnosis performed by Bryan Lemma, MD.  Electronically signed 12/17/2015 1:58:36PM   The electronic signature indicates that  the named Attending Pathologist has evaluated the  specimen  Technical component performed at [facility], [address] [place], [place] [address] Lab: [phone] Dir: Darrick Penna. Evette Doffing, MD  Professional component performed at [facility], [facility], [place], [place], [place] [address] Lab: [phone] Dir: Dellia Nims. Reuel Derby, MD   ADDENDUM: Immunohistochemistry was performed for Napsin A. Most of the neoplastic cells are negative and a few show weak staining. Interpretation is difficult in some areas because of abundant alveolar macrophages showing strong staining. The results are indeterminate with regard to tissue of origin. Addendum #1 performed by Bryan Lemma, MD.  Electronically signed 12/21/2015 2:22:53PM    Technical component performed at [facility], [address], [place], [place] [address] Lab: [phone] Dir: Darrick Penna. Evette Doffing, MD  Professional component performed at [facility], [facility], [address] [place], [place], [place] [address] Lab: [phone] Dir: Dellia Nims. Rubinas, MD    . Glucose-Capillary 12/15/2015 151* 65 - 99 mg/dL Final  . Glucose-Capillary 12/16/2015 127* 65 - 99 mg/dL Final    Assessment:  Christina Sharp is a 71 y.o. 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  Bronchoscopy on 09/22/2015 revealed mild to moderate chronic bronchitis changes with no endobronchial lesions.  Brushings from the right lower lobe were negative for malignancy.  Repeat bronchoscopy on 11/22/2015 was performed.  An EBUS scope was unable to be passed in the RML.  Transbronchial needle aspirations of a lesion were performed in the medial segment of the right middle lobe.  Pathology revealed atypical cells.  CEA and LDH were normal on 11/23/2015.  She underwent video assisted thoracoscopy (VATS) on 12/14/2015.  On the undersurface of the right middle lobe there wasa palpable mass.  Pathology from the right middle lobe wedge resection revealed  adenocarcinoma, lepidic (80%), papillary (10%), and solid (10%).  Pleural biopsy was negative for malignancy.  EGFR, ALK, ROS1, BRAF were negative.  PDL-1 was 5%.  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  Tumor was microsatellite stable.  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  There were no alterations in EGFR, ALK, BRAF, MET, ERBB2, and ROS1.  PET scan on 11/21/2015 revealed a 2.9 x 3 cm central right lower lobe lung lesion (SUV 7.0).  Areas of surrounding more patchy right lower lobe opacity were also suspicious for metastasis or synchronous primaries. There were bilateral pulmonary nodules, including hypermetabolic right upper and right lower lobe pulmonary nodules suspicious for metastatic disease.  There was no thoracic nodal hypermetabolism.  There was an area of soft tissue fullness within the high left neck suspicious for an enlarged lateral retropharyngeal node (? skip nodal metastasis).  There was indeterminate right renal lesion.  Clinical stage is T3N0M1.  Head MRI on 12/31/2015 revealed atrophy and small vessel disease.  There were no acute intracranial findings or intracranial metastatic disease.  There was suspected metastatic disease to a 1.2 x 2.3 cmleft lateral retropharyngeal node which displaced the left ICA medially.  Neck, chest, abdomen, and pelvic CT scan on 02/09/2016 revealed a 1.3 x 2.6 cm lateral retropharyngeal lymph node (stable), increase in size of nodules in the lingula and LLL and persistent nodular consolidation in the RLL.  Mediastinal adenopathy was stable.  Pretreatment labs included a TSH 0.278 (  low), free T4 1.0 (normal), and ACTH < 1.1 (low).  ACTH was inadvertantly drawn the morning after receiving Decadron.  Follow-up early am cortisol on 02/07/2016 was 20.3 (6.7-22.6).    She received 4 cycles of carboplatin, Alimta,  and Keytruda (02/03/2016 - 04/06/2016).   PET scan on 04/24/2016 revealed new and enlarging bilateral pulmonary nodules compared to the prior exam, hypermetabolic, compatible with metastatic disease in the lungs.  There was low-grade metabolic activity in mediastinal lymph nodes. There continues to be a nodular structure favoring a neck lymph node in the upper left station IIa region, mildly hypermetabolic, likely reflecting a metastatic lymph node.  There was no evidence of  She received 3 cycles of Taxotere (05/25/2016 - 07/06/2016).  She tolerated her chemotherapy well.  Counts were good.  She enrolled on Lycera clinical trial of [phone] (ROR gamma agonist).  She received 28 day cycles x 2 (09/07/2016 - 11/01/2016) at [facility] Clinical trial was discontinued on 11/02/2016 secondary to progression.  Chest, abdomen, and pelvic CT at [facility] on 11/02/2016 revealed interval increased size of multiple bilateral heterogeneous consolidative opcacities most c/w disease progresion.  There was stable mediastinal adenopathy.  There was a stable hyperattenuating lesion of the gallbladder fundus.  She is s/p 1 cycle of gemcitabine (11/23/2016 - 11/30/2016).  She was admitted to [facility] from 12/07/2016 - 1112/2018 with pneumonia.  She was treated with aztreonam and discharged on Levaquin.  She also received Solumedrol, Budesonide and Duoneb SVN treatments. She was discharged on 2.5 liters oxygen via [place].    Chest CT on 12/07/2016 revealed significant interval worsening aeration of both lungs. Findings were compatible with marked progression of multifocal bilateral pulmonary adenocarcinoma. Findings were also compatible with progression of disease with superimposed multifocal bilateral infection.  There was progression of mediastinal adenopathy.  She has anemia of chronic disease.  Work-up on 12/11/2016 revealed a normal ferritin, iron studies, B12, and folate.  Symptomatically, she is breathing better. She remains on  supplemental oxygen at 3L/[place]. Tachycardia has improved on Metoprolol 12m every 6 hours.   Plan: 1.  Discuss follow up with Dr. SAniceto Boss Dr. CMike Giphas been in contact with him regarding getting patient in for a clinical trial.    2.  Follow up with radiology regarding image comparison.  3.  Continue metoprolol. 4.  RTC after meeting with Dr. SAniceto Boss We will call patient for an appointment once plan has been defined.    BHonor Loh NP  12/24/2016, 10:01 AM   I saw and evaluated the patient, participating in the key portions of the service and reviewing pertinent diagnostic studies and records.  I reviewed the nurse practitioner's note and agree with the findings and the plan.  The assessment and plan were discussed with the patient.  A few questions were asked by the patient and answered.   MNolon Stalls MD 12/24/2016,10:01 AM

## 2016-12-24 ENCOUNTER — Inpatient Hospital Stay (HOSPITAL_BASED_OUTPATIENT_CLINIC_OR_DEPARTMENT_OTHER): Payer: BLUE CROSS/BLUE SHIELD | Admitting: Oncology

## 2016-12-24 ENCOUNTER — Inpatient Hospital Stay: Payer: BLUE CROSS/BLUE SHIELD | Admitting: Hematology and Oncology

## 2016-12-24 ENCOUNTER — Inpatient Hospital Stay: Payer: BLUE CROSS/BLUE SHIELD

## 2016-12-24 VITALS — BP 118/78 | HR 97 | Temp 97.7°F | Wt 137.1 lb

## 2016-12-24 DIAGNOSIS — L509 Urticaria, unspecified: Secondary | ICD-10-CM | POA: Diagnosis not present

## 2016-12-24 DIAGNOSIS — E860 Dehydration: Secondary | ICD-10-CM

## 2016-12-24 DIAGNOSIS — R21 Rash and other nonspecific skin eruption: Secondary | ICD-10-CM | POA: Diagnosis not present

## 2016-12-24 DIAGNOSIS — R5383 Other fatigue: Secondary | ICD-10-CM | POA: Diagnosis not present

## 2016-12-24 DIAGNOSIS — E7800 Pure hypercholesterolemia, unspecified: Secondary | ICD-10-CM

## 2016-12-24 DIAGNOSIS — R944 Abnormal results of kidney function studies: Secondary | ICD-10-CM

## 2016-12-24 DIAGNOSIS — R531 Weakness: Secondary | ICD-10-CM | POA: Diagnosis not present

## 2016-12-24 DIAGNOSIS — I10 Essential (primary) hypertension: Secondary | ICD-10-CM

## 2016-12-24 DIAGNOSIS — R110 Nausea: Secondary | ICD-10-CM

## 2016-12-24 DIAGNOSIS — Z95828 Presence of other vascular implants and grafts: Secondary | ICD-10-CM

## 2016-12-24 DIAGNOSIS — C3431 Malignant neoplasm of lower lobe, right bronchus or lung: Secondary | ICD-10-CM | POA: Diagnosis not present

## 2016-12-24 DIAGNOSIS — D696 Thrombocytopenia, unspecified: Secondary | ICD-10-CM

## 2016-12-24 DIAGNOSIS — R000 Tachycardia, unspecified: Secondary | ICD-10-CM

## 2016-12-24 DIAGNOSIS — Z85828 Personal history of other malignant neoplasm of skin: Secondary | ICD-10-CM

## 2016-12-24 DIAGNOSIS — Z87891 Personal history of nicotine dependence: Secondary | ICD-10-CM

## 2016-12-24 DIAGNOSIS — D649 Anemia, unspecified: Secondary | ICD-10-CM

## 2016-12-24 DIAGNOSIS — R0902 Hypoxemia: Secondary | ICD-10-CM

## 2016-12-24 DIAGNOSIS — C3490 Malignant neoplasm of unspecified part of unspecified bronchus or lung: Secondary | ICD-10-CM

## 2016-12-24 DIAGNOSIS — J449 Chronic obstructive pulmonary disease, unspecified: Secondary | ICD-10-CM

## 2016-12-24 DIAGNOSIS — N2889 Other specified disorders of kidney and ureter: Secondary | ICD-10-CM

## 2016-12-24 DIAGNOSIS — R05 Cough: Secondary | ICD-10-CM

## 2016-12-24 DIAGNOSIS — R062 Wheezing: Secondary | ICD-10-CM | POA: Diagnosis not present

## 2016-12-24 DIAGNOSIS — R590 Localized enlarged lymph nodes: Secondary | ICD-10-CM

## 2016-12-24 DIAGNOSIS — E871 Hypo-osmolality and hyponatremia: Secondary | ICD-10-CM | POA: Diagnosis not present

## 2016-12-24 DIAGNOSIS — Z85118 Personal history of other malignant neoplasm of bronchus and lung: Secondary | ICD-10-CM

## 2016-12-24 DIAGNOSIS — Z79899 Other long term (current) drug therapy: Secondary | ICD-10-CM

## 2016-12-24 DIAGNOSIS — Z7189 Other specified counseling: Secondary | ICD-10-CM

## 2016-12-24 LAB — CBC WITH DIFFERENTIAL/PLATELET
Basophils Absolute: 0.1 10*3/uL (ref 0–0.1)
Basophils Relative: 0 %
Eosinophils Absolute: 0 10*3/uL (ref 0–0.7)
Eosinophils Relative: 0 %
HCT: 34.3 % — ABNORMAL LOW (ref 35.0–47.0)
Hemoglobin: 11.3 g/dL — ABNORMAL LOW (ref 12.0–16.0)
Lymphocytes Relative: 5 %
Lymphs Abs: 0.7 10*3/uL — ABNORMAL LOW (ref 1.0–3.6)
MCH: 31.8 pg (ref 26.0–34.0)
MCHC: 33.1 g/dL (ref 32.0–36.0)
MCV: 96 fL (ref 80.0–100.0)
Monocytes Absolute: 1 10*3/uL — ABNORMAL HIGH (ref 0.2–0.9)
Monocytes Relative: 7 %
Neutro Abs: 12.8 10*3/uL — ABNORMAL HIGH (ref 1.4–6.5)
Neutrophils Relative %: 88 %
Platelets: 487 10*3/uL — ABNORMAL HIGH (ref 150–440)
RBC: 3.57 MIL/uL — ABNORMAL LOW (ref 3.80–5.20)
RDW: 17.9 % — ABNORMAL HIGH (ref 11.5–14.5)
WBC: 14.7 10*3/uL — ABNORMAL HIGH (ref 3.6–11.0)

## 2016-12-24 LAB — COMPREHENSIVE METABOLIC PANEL
ALT: 15 U/L (ref 14–54)
AST: 22 U/L (ref 15–41)
Albumin: 3.6 g/dL (ref 3.5–5.0)
Alkaline Phosphatase: 99 U/L (ref 38–126)
Anion gap: 12 (ref 5–15)
BUN: 16 mg/dL (ref 6–20)
CO2: 22 mmol/L (ref 22–32)
Calcium: 9.1 mg/dL (ref 8.9–10.3)
Chloride: 100 mmol/L — ABNORMAL LOW (ref 101–111)
Creatinine, Ser: 1.1 mg/dL — ABNORMAL HIGH (ref 0.44–1.00)
GFR calc Af Amer: 57 mL/min — ABNORMAL LOW (ref >60)
GFR calc non Af Amer: 49 mL/min — ABNORMAL LOW (ref >60)
Glucose, Bld: 183 mg/dL — ABNORMAL HIGH (ref 65–99)
Potassium: 3.9 mmol/L (ref 3.5–5.1)
Sodium: 134 mmol/L — ABNORMAL LOW (ref 135–145)
Total Bilirubin: 1.1 mg/dL (ref 0.3–1.2)
Total Protein: 7.3 g/dL (ref 6.5–8.1)

## 2016-12-24 LAB — MAGNESIUM: Magnesium: 2 mg/dL (ref 1.7–2.4)

## 2016-12-24 MED ORDER — PREDNISONE 10 MG (21) PO TBPK: ORAL_TABLET | ORAL | 0 refills | Status: DC

## 2016-12-24 MED ORDER — DIPHENHYDRAMINE HCL 50 MG PO TABS: 50.0000 mg | ORAL_TABLET | Freq: Three times a day (TID) | ORAL | 0 refills | Status: AC | PRN

## 2016-12-24 MED ORDER — HEPARIN SOD (PORK) LOCK FLUSH 100 UNIT/ML IV SOLN
500.0000 [IU] | Freq: Once | INTRAVENOUS | Status: AC
  Administered 2016-12-24: 500 [IU] via INTRAVENOUS

## 2016-12-24 NOTE — Progress Notes (Signed)
Patient returns to clinic this afternoon with complaints of rash/hives on her face. She is being seen by NP Faythe Casa. There is some redness under her eyes, however the most of the rash is on her abdomen.  She states she has not taken any new medications. She has not had any new foods.  BP 118/78 HR 116 Temp 97.7.  O2 86% initially with oxygen concentrator @ 3L.  After resting she increased to 90/91 and HR down to 109.

## 2016-12-24 NOTE — Progress Notes (Signed)
Pt in for follow up, reports had good week since last seen.  Has light red rash on abdomen, which pt first noticed last night.  Reports rash itches.  Wearing oxygen at 3L/m.

## 2016-12-24 NOTE — Progress Notes (Signed)
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  Bronchoscopy on 09/22/2015 revealed mild to moderate chronic bronchitis changes with no endobronchial lesions.  Brushings from the right lower lobe were negative for malignancy.  Repeat bronchoscopy on 11/22/2015 was performed.  An EBUS scope was unable to be passed in the RML.  Transbronchial needle aspirations of a lesion were performed in the medial segment of the right middle lobe.  Pathology revealed atypical cells.  CEA and LDH were normal on 11/23/2015.  She underwent video assisted thoracoscopy (VATS) on 12/14/2015.  On the undersurface of the right middle lobe there wasa palpable mass.  Pathology from the right middle lobe wedge resection revealed adenocarcinoma, lepidic (80%), papillary (10%), and solid (10%).  Pleural biopsy was negative for malignancy.  EGFR, ALK, ROS1, BRAF were negative.  PDL-1 was 5%.  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  Tumor was microsatellite stable.  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  There were no alterations in EGFR, ALK, BRAF, MET, ERBB2, and ROS1.  PET  scan on 11/21/2015 revealed a 2.9 x 3 cm central right lower lobe lung lesion (SUV 7.0).  Areas of surrounding more patchy right lower lobe opacity were also suspicious for metastasis or synchronous primaries. There were bilateral pulmonary nodules, including hypermetabolic right upper and right lower lobe pulmonary nodules suspicious for metastatic disease.  There was no thoracic nodal hypermetabolism.  There was an area of soft tissue fullness within the high left neck suspicious for an enlarged lateral retropharyngeal node (? skip nodal metastasis).  There was indeterminate right renal lesion.  Clinical stage is T3N0M1.  Head MRI on 12/31/2015 revealed atrophy and small vessel disease.  There were no acute intracranial findings or intracranial metastatic disease.  There was suspected metastatic disease to a 1.2 x 2.3 cmleft lateral retropharyngeal node which displaced the left ICA medially.  Neck, chest, abdomen, and pelvic CT scan on 02/09/2016 revealed a 1.3 x 2.6 cm lateral retropharyngeal lymph node (stable), increase in size of nodules in the lingula and LLL and persistent nodular consolidation in the RLL.  Mediastinal adenopathy was stable.  Pretreatment labs included a TSH 0.278 (low), free T4 1.0 (normal), and ACTH < 1.1 (low).  ACTH was inadvertantly drawn the morning after receiving Decadron.  Follow-up early am cortisol on 02/07/2016 was 20.3 (6.7-22.6).    She received 4 cycles of carboplatin, Alimta, and Keytruda (02/03/2016 - 04/06/2016).   PET scan on 04/24/2016 revealed new and enlarging bilateral pulmonary nodules compared to the prior exam, hypermetabolic, compatible with metastatic disease in the lungs. There was low-grade metabolic activity in mediastinal lymph nodes. There continues to be a nodular structure favoring a neck lymph node in the upper left station IIa region, mildly hypermetabolic, likely reflecting a metastatic lymph node.  There was no evidence of  She  received 3 cycles of Taxotere (05/25/2016 - 07/06/2016).  She tolerated her chemotherapy well.  Counts were good.  She enrolled on Lycera clinical trial of [phone] (ROR gamma agonist).  She received 28 day cycles x 2 (09/07/2016 - 11/01/2016) at [facility]. Clinical trial was discontinued on 11/02/2016 secondary to progression.  Chest, abdomen, and pelvic CT at [facility] on 11/02/2016 revealed interval increased size of multiple bilateral heterogeneous consolidative opcacities most c/w disease progresion.  There was stable mediastinal adenopathy.  There was a stable hyperattenuating lesion of the gallbladder fundus.  She is s/p 1 cycle of gemcitabine (11/23/2016 - 11/30/2016).  She was admitted to [facility] from 12/07/2016 - 1112/2018 with pneumonia.  She was treated with aztreonam and discharged on Levaquin.  She also received Solumedrol, Budesonide and Duoneb SVN treatments. She was discharged on 2.5 liters oxygen via [place].    Chest CT on 12/07/2016 revealed significant interval worsening aeration of both lungs. Findings were compatible with marked progression of multifocal bilateral pulmonary adenocarcinoma. Findings were also compatible with progression of disease with superimposed multifocal bilateral infection.  There was progression of mediastinal adenopathy.  She has anemia of chronic disease.  Work-up on 12/11/2016 revealed a normal ferritin, iron studies, B12, and folate.  Interval history- Patient presents today for "whelps" on her abdomen that began approximately 1 day ago.   Rash: Patient complains of rash involving the abdomen, torso and trunk. Rash started 1 day ago. Appearance of rash at onset: small pruritic red bumps. Rash has changed over time and is now raised red pruritic whelps/hives. Rash has been progressive. Discomfort associated with rash: is painful and is pruritic.  Associated symptoms: itching. Denies: abdominal pain, fever, nausea, sore throat and vomiting. Patient has not had  previous evaluation of rash. Patient has not had previous treatment. Patient has not had contacts with similar rash. Patient has not identified precipitant. Patient and partner recalled all medications, foods and any new exposures over the past 24 hours and we are unable to identify the precipitating factor. This reaction is noted to be similar to her reaction to penicillins.   Patient has not had new exposures (soaps, lotions, laundry detergents, foods, medications, plants, insects or animals.).  ECOG FS:1 - Symptomatic but completely ambulatory  Review of systems- Review of Systems  Constitutional: Positive for malaise/fatigue. Negative for chills and fever.  HENT: Negative.   Eyes: Negative.   Respiratory: Positive for cough, sputum production, shortness of breath and wheezing.   Cardiovascular: Negative for chest pain and palpitations.  Gastrointestinal: Negative for abdominal pain, nausea and vomiting.  Genitourinary: Negative.   Musculoskeletal: Negative.   Skin: Positive for itching and rash.  Neurological: Positive for weakness.  Endo/Heme/Allergies: Negative.   Psychiatric/Behavioral: Negative.      Current treatment- No active treatment at time time. Awaiting clinical trial discussion with Dr. Aniceto Boss. Just finished antibiotics for pneumonia.   Allergies  Allergen Reactions  . Augmentin [Amoxicillin-Pot Clavulanate] Hives  . Penicillins Hives    Has patient had a PCN reaction causing immediate rash, facial/tongue/throat swelling, SOB or lightheadedness with hypotension: No Has patient had a PCN reaction causing severe rash involving mucus membranes or skin necrosis: No Has patient had a PCN reaction that required hospitalization No Has patient had a PCN reaction occurring within the last 10 years: Yes If all of the above answers are "NO", then may proceed with Cephalosporin use.       Past Medical History:  Diagnosis Date  .  Cancer ([place])    basal cell   .  Emphysema of lung ([place])   . Hypercholesteremia   . Hypertension   . Lung mass   . Renal disorder    renal insufficiency     Past Surgical History:  Procedure Laterality Date  . BASAL CELL CARCINOMA EXCISION    . COLONOSCOPY    . ENDOBRONCHIAL ULTRASOUND N/A 11/22/2015   Procedure: ENDOBRONCHIAL ULTRASOUND;  Surgeon: Flora Lipps, MD;  Location: [facility] ORS;  Service: Cardiopulmonary;  Laterality: N/A;  . FLEXIBLE BRONCHOSCOPY N/A 09/22/2015   Procedure: FLEXIBLE BRONCHOSCOPY;  Surgeon: [name], MD;  Location: [facility] ORS;  Service: Pulmonary;  Laterality: N/A;  . PORTACATH PLACEMENT Right 02/01/2016   Procedure: INSERTION PORT-A-CATH;  Surgeon: Nestor Lewandowsky, MD;  Location: [facility] ORS;  Service: General;  Laterality: Right;  Right internal jugular vein  . VIDEO ASSISTED THORACOSCOPY (VATS)/THOROCOTOMY Right 12/14/2015   Procedure: VIDEO ASSISTED THORACOSCOPY (VATS)/THOROCOTOMY;  Surgeon: Nestor Lewandowsky, MD;  Location: [facility] ORS;  Service: Thoracic;  Laterality: Right;    Social History   Socioeconomic History  . Marital status: Single    Spouse name: Not on file  . Number of children: Not on file  . Years of education: Not on file  . Highest education level: Not on file  Social Needs  . Financial resource strain: Not hard at all  . Food insecurity - worry: Never true  . Food insecurity - inability: Never true  . Transportation needs - medical: No  . Transportation needs - non-medical: No  Occupational History  . Not on file  Tobacco Use  . Smoking status: Former Smoker    Packs/day: 0.50    Years: 45.00    Pack years: 22.50    Types: Cigarettes    Last attempt to quit: 11/06/2015    Years since quitting: 1.1  . Smokeless tobacco: Never Used  . Tobacco comment: hasn't smoked since 11-06-15  Substance and Sexual Activity  . Alcohol use: Yes    Comment: rare  . Drug use: No  . Sexual activity: Not on file  Other Topics Concern  . Not on file  Social History Narrative  .  Not on file    Family History  Problem Relation Age of Onset  . Alzheimer's disease Mother   . Heart disease Father   . Heart attack Brother      Current Outpatient Medications:  .  albuterol (PROVENTIL HFA;VENTOLIN HFA) 108 (90 Base) MCG/ACT inhaler, Inhale 2 puffs every 6 (six) hours as needed into the lungs for wheezing or shortness of breath., Disp: 1 Inhaler, Rfl: 0 .  albuterol (PROVENTIL) (2.5 MG/3ML) 0.083% nebulizer solution, Take 3 mLs (2.5 mg total) every 4 (four) hours as needed by nebulization for wheezing or shortness of breath., Disp: 360 mL, Rfl: 0 .  amLODipine (NORVASC) 10 MG tablet, Take 10 mg by mouth daily., Disp: , Rfl:  .  atorvastatin (LIPITOR) 20 MG tablet, Take 20 mg daily by mouth., Disp: , Rfl: 3 .  benzonatate (TESSALON) 200 MG capsule, Take 1 capsule (200 mg total) 3 (three) times daily as needed by mouth for cough., Disp: 20 capsule, Rfl: 0 .  diphenhydrAMINE (BENADRYL) 50 MG tablet, Take 1 tablet (50 mg total) by mouth every 8 (eight) hours as needed for itching or sleep., Disp: 30 tablet, Rfl: 0 .  feeding supplement, ENSURE ENLIVE, (ENSURE ENLIVE) LIQD, Take 237 mLs daily by mouth., Disp: 60 Bottle, Rfl: 0 .  hydrALAZINE (APRESOLINE) 50 MG  tablet, Take 50 mg by mouth 3 (three) times daily., Disp: , Rfl:  .  KLOR-CON 10 10 MEQ tablet, TAKE 1 TABLET (10 MEQ TOTAL) BY MOUTH 2 (TWO) TIMES DAILY., Disp: 60 tablet, Rfl: 1 .  magnesium oxide (MAG-OX) 400 MG tablet, TAKE 1 TABLET (400 MG TOTAL) BY MOUTH DAILY., Disp: 30 tablet, Rfl: 1 .  metoprolol tartrate (LOPRESSOR) 25 MG tablet, Take 1 tablet (25 mg total) by mouth 4 (four) times daily as needed., Disp: 30 tablet, Rfl: 0 .  mometasone-formoterol (DULERA) 100-5 MCG/ACT AERO, Inhale 2 puffs 2 (two) times daily into the lungs. (Patient not taking: Reported on 12/11/2016), Disp: 1 Inhaler, Rfl: 0 .  ondansetron (ZOFRAN) 8 MG tablet, Take 1 tablet every 8 (eight) hours as needed by mouth., Disp: , Rfl:  .  predniSONE  (DELTASONE) 20 MG tablet, Take 2 tablets (40 mg total) daily with breakfast by mouth., Disp: 6 tablet, Rfl: 0 .  predniSONE (STERAPRED UNI-PAK 21 TAB) 10 MG (21) TBPK tablet, Take as prescribed., Disp: 21 tablet, Rfl: 0 .  sodium bicarbonate 650 MG tablet, Take 1,300 mg by mouth daily. , Disp: , Rfl:  No current facility-administered medications for this visit.   Facility-Administered Medications Ordered in Other Visits:  .  heparin lock flush 100 unit/mL, 500 Units, Intravenous, Once, [place], Drue Second, MD  Physical exam: There were no vitals filed for this visit. Physical Exam  Constitutional: She is oriented to person, place, and time and well-developed, well-nourished, and in no distress.  HENT:  Head: Normocephalic and atraumatic.  Eyes: Pupils are equal, round, and reactive to light.  Neck: Normal range of motion. Neck supple.  Cardiovascular: Regular rhythm and intact distal pulses. Tachycardia present.  Pulmonary/Chest: She has wheezes in the right upper field and the left upper field. She has rhonchi in the right lower field and the left lower field.  Abdominal: Soft. Normal appearance and bowel sounds are normal.  Musculoskeletal: Normal range of motion.  Neurological: She is alert and oriented to person, place, and time.  Skin: Skin is warm, dry and intact. Rash noted. Rash is urticarial.     Psychiatric: Affect normal.     CMP Latest Ref Rng & Units 12/24/2016  Glucose 65 - 99 mg/dL 183(H)  BUN 6 - 20 mg/dL 16  Creatinine 0.44 - 1.00 mg/dL 1.10(H)  Sodium 135 - 145 mmol/L 134(L)  Potassium 3.5 - 5.1 mmol/L 3.9  Chloride 101 - 111 mmol/L 100(L)  CO2 22 - 32 mmol/L 22  Calcium 8.9 - 10.3 mg/dL 9.1  Total Protein 6.5 - 8.1 g/dL 7.3  Total Bilirubin 0.3 - 1.2 mg/dL 1.1  Alkaline Phos 38 - 126 U/L 99  AST 15 - 41 U/L 22  ALT 14 - 54 U/L 15   CBC Latest Ref Rng & Units 12/24/2016  WBC 3.6 - 11.0 K/uL 14.7(H)  Hemoglobin 12.0 - 16.0 g/dL 11.3(L)  Hematocrit 35.0 -  47.0 % 34.3(L)  Platelets 150 - 440 K/uL 487(H)    No images are attached to the encounter.  Dg Chest 2 View  Result Date: 12/18/2016 CLINICAL DATA:  History of lung carcinoma, pneumonia, followup EXAM: CHEST  2 VIEW COMPARISON:  CT chest of 12/07/2016 and chest x-ray of the same day FINDINGS: There is little interval change to very slight worsening of patchy airspace disease bilaterally left left-greater-than-right most consistent with multifocal pneumonia. Small right effusion cannot be excluded. Mediastinal hilar contours are unchanged and heart size is within normal limits.  Right-sided Port-A-Cath is present with tip overlying the lower SVC. No acute bony abnormality is seen. IMPRESSION: 1. Little change to slight worsening of patchy airspace disease bilaterally most consistent with multifocal pneumonia. 2. Port-A-Cath tip overlies the lower SVC. Electronically Signed   By: Ivar Drape M.D.   On: 12/18/2016 15:07   Dg Chest 2 View  Result Date: 12/07/2016 CLINICAL DATA:  Short of breath.  Lung cancer EXAM: CHEST  2 VIEW COMPARISON:  CT chest 07/24/2016, 02/09/2016. Chest x-ray 02/01/2016. PET 04/24/2016 FINDINGS: Progression of extensive bilateral lung opacities. The air bronchograms associated with these opacities bilaterally. No effusion or heart failure. Port-A-Cath tip in the SVC at the [facility] /RA junction. IMPRESSION: Extensive bilateral airspace disease. This has progressed from prior studies. In this patient with lung cancer, this may represent progression of metastatic disease. Pneumonia is possible particularly in the left upper lobe. Electronically Signed   By: Franchot Gallo M.D.   On: 12/07/2016 15:23   Ct Chest W Contrast  Result Date: 12/07/2016 CLINICAL DATA:  Followup lung cancer. EXAM: CT CHEST WITH CONTRAST TECHNIQUE: Multidetector CT imaging of the chest was performed during intravenous contrast administration. CONTRAST:  55m ISOVUE-300 IOPAMIDOL (ISOVUE-300) INJECTION 61%  COMPARISON:  07/24/2016 FINDINGS: Cardiovascular: The heart size appears normal. Aortic atherosclerosis identified. Calcification within the RCA, LAD and left circumflex coronary artery is identified. Mediastinum/Nodes: The trachea appears patent and is midline. Normal appearance of the esophagus. Mediastinal adenopathy is identified. Index left-sided pre-vascular node measures 1.3 cm, image 49 of series 2. Previously 0.8 cm. Index right paratracheal lymph node Measures 1.4 cm, image 36 of series 2. Previously 1.2 cm. Lower right paratracheal lymph node measures 1.4 cm, image 43 of series 2. Previously 1.0 cm. 1.6 cm sub- carinal lymph node is unchanged. Lungs/Pleura: Advanced smoking related changes are identified throughout both lungs. There is no pleural effusion. Interval progression of bilateral confluent areas of masslike architectural distortion compatible with multifocal pulmonary adenocarcinoma. Significant worsening aeration to the left upper lobe. Index mass within the lingula measures 6.3 by 4.6 cm, image 78 of series 3. Previously this measured 1.8 by 3.6 cm. Left lower lobe peripheral subpleural mass measures 5.7 cm 5.7 x 3.6 cm, image 94 of series 3. Previously 1.7 x 2.0 cm. Posterior left lower lobe the lesion measures 3.2 by 4.0 cm, image 93 of series 3. Previously 0.8 by 0.8 cm. Large area of masslike consolidation and ground-glass attenuation in the left upper lobe Measures 6.2 x 12.8 cm. Previously index nodule in this area measured 1.1 x 1.1 cm. Index lesion within the posterior and lateral left upper lobe has solid, cystic and ground-glass components measuring 9.4 x 5.8 cm, image 58 of series 3. Previous 5.9 by 4.0 cm. Upper Abdomen: No acute abnormality. Musculoskeletal: No chest wall abnormality. No acute or significant osseous findings. IMPRESSION: 1. There is been significant interval worsening aeration of both lungs. Findings are compatible with marked progression of multifocal bilateral  pulmonary adenocarcinoma. Findings are also compatible with progression of disease with superimposed multifocal bilateral infection. 2. Progression of mediastinal adenopathy. 3. Aortic Atherosclerosis (ICD10-I70.0) and Emphysema (ICD10-J43.9). 4. Multi vessel coronary artery calcifications. Electronically Signed   By: TKerby MoorsM.D.   On: 12/07/2016 16:29   Ct Outside Films Chest  Result Date: 12/12/2016 This examination belongs to an outside facility and is stored here for comparison purposes only.  Contact the originating outside institution for any associated report or interpretation.  Ct Outside Films Body  Result Date:  12/12/2016 This examination belongs to an outside facility and is stored here for comparison purposes only.  Contact the originating outside institution for any associated report or interpretation.    Assessment and plan- Patient is a 71 y.o. female who presents for urticarial rash. Rash noted on abdomen and back. Rash appears itchy with obvious "scratch" marks. Rash is not present on any other parts of her body. Patient is not short of breath. She is on 3 L continuous oxygen. Oxygen saturations initially were 87% but were rechecked at 91%..This appears to be her baseline. Patient is tachycardic (HR 116).  Scattered rhonchi and wheezing heard throughout lungs.  1. Urticarial rash/allergic reaction: RX Prednisone Dose Pak and RX Benadryl 50 mg TID PRN. Patient instructed to go to the emergency room if symptoms worsen. Patient educated on signs and symptoms of anaphylactic reaction such as at the tongue swelling, difficulty swallowing, facial swelling, dizziness, flushing, difficulty breathing, rapid breathing or worsening of hives.  2. Tachycardia: Improved since her Metoprolol has been adjusted. She is currently on 24m every 6 hours. Eventually will convert back to metoprolol XR 100 mg.  3. RTC after meeting with Dr. SAniceto Bossand discussing clinical trial.    Visit  Diagnosis 1. Hives of unknown origin     Patient expressed understanding and was in agreement with this plan. She also understands that She can call clinic at any time with any questions, concerns, or complaints.   Greater than 50% was spent in counseling and coordination of care with this patient including but not limited to discussion of the relevant topics above (See A&P) including, but not limited to diagnosis and management of acute and chronic medical conditions.    JFaythe Casa AGNP-C [facility] [place] [phone]- [phone]/27/2018 11:26 AM

## 2016-12-26 ENCOUNTER — Encounter: Payer: Self-pay | Admitting: Hematology and Oncology

## 2016-12-26 ENCOUNTER — Other Ambulatory Visit: Payer: Self-pay | Admitting: *Deleted

## 2016-12-27 ENCOUNTER — Encounter: Payer: Self-pay | Admitting: Hematology and Oncology

## 2016-12-27 MED ORDER — BENZONATATE 200 MG PO CAPS: 200.0000 mg | ORAL_CAPSULE | Freq: Three times a day (TID) | ORAL | 0 refills | Status: AC | PRN

## 2017-01-05 ENCOUNTER — Emergency Department: Payer: Medicare Other

## 2017-01-05 ENCOUNTER — Other Ambulatory Visit: Payer: Self-pay

## 2017-01-05 ENCOUNTER — Other Ambulatory Visit: Payer: Self-pay | Admitting: Internal Medicine

## 2017-01-05 ENCOUNTER — Inpatient Hospital Stay
Admission: EM | Admit: 2017-01-05 | Discharge: 2017-01-13 | DRG: 193 | Disposition: A | Discharge status: Hospice | Payer: Medicare Other | Attending: Specialist | Admitting: Specialist

## 2017-01-05 ENCOUNTER — Encounter: Payer: Self-pay | Admitting: Internal Medicine

## 2017-01-05 DIAGNOSIS — R630 Anorexia: Secondary | ICD-10-CM | POA: Diagnosis not present

## 2017-01-05 DIAGNOSIS — C3491 Malignant neoplasm of unspecified part of right bronchus or lung: Secondary | ICD-10-CM | POA: Diagnosis present

## 2017-01-05 DIAGNOSIS — C3432 Malignant neoplasm of lower lobe, left bronchus or lung: Secondary | ICD-10-CM | POA: Diagnosis not present

## 2017-01-05 DIAGNOSIS — Z7189 Other specified counseling: Secondary | ICD-10-CM | POA: Diagnosis not present

## 2017-01-05 DIAGNOSIS — Z66 Do not resuscitate: Secondary | ICD-10-CM | POA: Diagnosis present

## 2017-01-05 DIAGNOSIS — Z79899 Other long term (current) drug therapy: Secondary | ICD-10-CM

## 2017-01-05 DIAGNOSIS — Z881 Allergy status to other antibiotic agents status: Secondary | ICD-10-CM

## 2017-01-05 DIAGNOSIS — Z794 Long term (current) use of insulin: Secondary | ICD-10-CM | POA: Diagnosis not present

## 2017-01-05 DIAGNOSIS — J189 Pneumonia, unspecified organism: Secondary | ICD-10-CM | POA: Diagnosis present

## 2017-01-05 DIAGNOSIS — J440 Chronic obstructive pulmonary disease with acute lower respiratory infection: Secondary | ICD-10-CM | POA: Diagnosis present

## 2017-01-05 DIAGNOSIS — R05 Cough: Secondary | ICD-10-CM

## 2017-01-05 DIAGNOSIS — R6510 Systemic inflammatory response syndrome (SIRS) of non-infectious origin without acute organ dysfunction: Secondary | ICD-10-CM

## 2017-01-05 DIAGNOSIS — C7800 Secondary malignant neoplasm of unspecified lung: Secondary | ICD-10-CM | POA: Diagnosis not present

## 2017-01-05 DIAGNOSIS — Z880 Allergy status to penicillin: Secondary | ICD-10-CM | POA: Diagnosis not present

## 2017-01-05 DIAGNOSIS — Z87891 Personal history of nicotine dependence: Secondary | ICD-10-CM

## 2017-01-05 DIAGNOSIS — K219 Gastro-esophageal reflux disease without esophagitis: Secondary | ICD-10-CM | POA: Diagnosis present

## 2017-01-05 DIAGNOSIS — J9621 Acute and chronic respiratory failure with hypoxia: Secondary | ICD-10-CM | POA: Diagnosis present

## 2017-01-05 DIAGNOSIS — J9601 Acute respiratory failure with hypoxia: Secondary | ICD-10-CM | POA: Diagnosis not present

## 2017-01-05 DIAGNOSIS — Z8249 Family history of ischemic heart disease and other diseases of the circulatory system: Secondary | ICD-10-CM | POA: Diagnosis not present

## 2017-01-05 DIAGNOSIS — Z7952 Long term (current) use of systemic steroids: Secondary | ICD-10-CM

## 2017-01-05 DIAGNOSIS — Z7951 Long term (current) use of inhaled steroids: Secondary | ICD-10-CM | POA: Diagnosis not present

## 2017-01-05 DIAGNOSIS — R590 Localized enlarged lymph nodes: Secondary | ICD-10-CM | POA: Diagnosis present

## 2017-01-05 DIAGNOSIS — E785 Hyperlipidemia, unspecified: Secondary | ICD-10-CM | POA: Diagnosis present

## 2017-01-05 DIAGNOSIS — J439 Emphysema, unspecified: Secondary | ICD-10-CM

## 2017-01-05 DIAGNOSIS — C3490 Malignant neoplasm of unspecified part of unspecified bronchus or lung: Secondary | ICD-10-CM | POA: Diagnosis not present

## 2017-01-05 DIAGNOSIS — Z85828 Personal history of other malignant neoplasm of skin: Secondary | ICD-10-CM | POA: Diagnosis not present

## 2017-01-05 DIAGNOSIS — D72829 Elevated white blood cell count, unspecified: Secondary | ICD-10-CM | POA: Diagnosis not present

## 2017-01-05 DIAGNOSIS — N2889 Other specified disorders of kidney and ureter: Secondary | ICD-10-CM

## 2017-01-05 DIAGNOSIS — N183 Chronic kidney disease, stage 3 (moderate): Secondary | ICD-10-CM | POA: Diagnosis present

## 2017-01-05 DIAGNOSIS — I129 Hypertensive chronic kidney disease with stage 1 through stage 4 chronic kidney disease, or unspecified chronic kidney disease: Secondary | ICD-10-CM | POA: Diagnosis present

## 2017-01-05 DIAGNOSIS — A419 Sepsis, unspecified organism: Secondary | ICD-10-CM

## 2017-01-05 DIAGNOSIS — E7800 Pure hypercholesterolemia, unspecified: Secondary | ICD-10-CM | POA: Diagnosis present

## 2017-01-05 DIAGNOSIS — R059 Cough, unspecified: Secondary | ICD-10-CM

## 2017-01-05 DIAGNOSIS — Z9981 Dependence on supplemental oxygen: Secondary | ICD-10-CM

## 2017-01-05 DIAGNOSIS — I10 Essential (primary) hypertension: Secondary | ICD-10-CM

## 2017-01-05 DIAGNOSIS — Z23 Encounter for immunization: Secondary | ICD-10-CM | POA: Diagnosis present

## 2017-01-05 DIAGNOSIS — R609 Edema, unspecified: Secondary | ICD-10-CM

## 2017-01-05 DIAGNOSIS — Z9221 Personal history of antineoplastic chemotherapy: Secondary | ICD-10-CM | POA: Diagnosis not present

## 2017-01-05 DIAGNOSIS — Y95 Nosocomial condition: Secondary | ICD-10-CM | POA: Diagnosis present

## 2017-01-05 DIAGNOSIS — C3431 Malignant neoplasm of lower lobe, right bronchus or lung: Secondary | ICD-10-CM | POA: Diagnosis not present

## 2017-01-05 LAB — CBC WITH DIFFERENTIAL/PLATELET
Basophils Absolute: 0.1 10*3/uL (ref 0–0.1)
Basophils Relative: 0 %
EOS ABS: 0.1 10*3/uL (ref 0–0.7)
EOS PCT: 0 %
HCT: 35.9 % (ref 35.0–47.0)
Hemoglobin: 11.7 g/dL — ABNORMAL LOW (ref 12.0–16.0)
LYMPHS ABS: 0.9 10*3/uL — AB (ref 1.0–3.6)
LYMPHS PCT: 4 %
MCH: 31.3 pg (ref 26.0–34.0)
MCHC: 32.7 g/dL (ref 32.0–36.0)
MCV: 95.6 fL (ref 80.0–100.0)
MONO ABS: 1.3 10*3/uL — AB (ref 0.2–0.9)
MONOS PCT: 6 %
Neutro Abs: 19.6 10*3/uL — ABNORMAL HIGH (ref 1.4–6.5)
Neutrophils Relative %: 90 %
PLATELETS: 395 10*3/uL (ref 150–440)
RBC: 3.75 MIL/uL — ABNORMAL LOW (ref 3.80–5.20)
RDW: 18.2 % — AB (ref 11.5–14.5)
WBC: 21.9 10*3/uL — AB (ref 3.6–11.0)

## 2017-01-05 LAB — TROPONIN I

## 2017-01-05 LAB — LACTIC ACID, PLASMA: Lactic Acid, Venous: 1.1 mmol/L (ref 0.5–1.9)

## 2017-01-05 LAB — BASIC METABOLIC PANEL
Anion gap: 12 (ref 5–15)
BUN: 16 mg/dL (ref 6–20)
CALCIUM: 9 mg/dL (ref 8.9–10.3)
CO2: 24 mmol/L (ref 22–32)
CREATININE: 0.99 mg/dL (ref 0.44–1.00)
Chloride: 101 mmol/L (ref 101–111)
GFR calc Af Amer: >60 mL/min (ref >60)
GFR, EST NON AFRICAN AMERICAN: 56 mL/min — AB (ref >60)
GLUCOSE: 153 mg/dL — AB (ref 65–99)
POTASSIUM: 3.5 mmol/L (ref 3.5–5.1)
SODIUM: 137 mmol/L (ref 135–145)

## 2017-01-05 LAB — GLUCOSE, CAPILLARY
GLUCOSE-CAPILLARY: 229 mg/dL — AB (ref 65–99)
GLUCOSE-CAPILLARY: 160 mg/dL — AB (ref 65–99)
Glucose-Capillary: 198 mg/dL — ABNORMAL HIGH (ref 65–99)

## 2017-01-05 MED ORDER — MOMETASONE FURO-FORMOTEROL FUM 100-5 MCG/ACT IN AERO
2.0000 | INHALATION_SPRAY | Freq: Two times a day (BID) | RESPIRATORY_TRACT | Status: DC
  Administered 2017-01-05 – 2017-01-13 (×16): 2 via RESPIRATORY_TRACT
  Filled 2017-01-05: qty 8.8

## 2017-01-05 MED ORDER — DIPHENHYDRAMINE HCL 25 MG PO CAPS: 50.0000 mg | ORAL_CAPSULE | Freq: Three times a day (TID) | ORAL | Status: DC | PRN

## 2017-01-05 MED ORDER — AMLODIPINE BESYLATE 10 MG PO TABS
10.0000 mg | ORAL_TABLET | Freq: Every day | ORAL | Status: DC
  Administered 2017-01-06 – 2017-01-13 (×8): 10 mg via ORAL
  Filled 2017-01-05 (×8): qty 1

## 2017-01-05 MED ORDER — ALBUTEROL SULFATE (2.5 MG/3ML) 0.083% IN NEBU
INHALATION_SOLUTION | RESPIRATORY_TRACT | Status: AC
  Administered 2017-01-05: 2.5 mg via RESPIRATORY_TRACT
  Filled 2017-01-05: qty 12

## 2017-01-05 MED ORDER — ACETAMINOPHEN 650 MG RE SUPP: 650.0000 mg | Freq: Four times a day (QID) | RECTAL | Status: DC | PRN

## 2017-01-05 MED ORDER — SODIUM BICARBONATE 650 MG PO TABS
1300.0000 mg | ORAL_TABLET | Freq: Every day | ORAL | Status: DC
  Administered 2017-01-05 – 2017-01-13 (×9): 1300 mg via ORAL
  Filled 2017-01-05 (×9): qty 2

## 2017-01-05 MED ORDER — VANCOMYCIN HCL IN DEXTROSE 1-5 GM/200ML-% IV SOLN
1000.0000 mg | Freq: Once | INTRAVENOUS | Status: AC
  Administered 2017-01-05: 1000 mg via INTRAVENOUS
  Filled 2017-01-05: qty 200

## 2017-01-05 MED ORDER — ATORVASTATIN CALCIUM 20 MG PO TABS
20.0000 mg | ORAL_TABLET | Freq: Every day | ORAL | Status: DC
  Administered 2017-01-05 – 2017-01-12 (×8): 20 mg via ORAL
  Filled 2017-01-05 (×8): qty 1

## 2017-01-05 MED ORDER — BISACODYL 10 MG RE SUPP: 10.0000 mg | Freq: Every day | RECTAL | Status: DC | PRN

## 2017-01-05 MED ORDER — IPRATROPIUM-ALBUTEROL 0.5-2.5 (3) MG/3ML IN SOLN
3.0000 mL | Freq: Once | RESPIRATORY_TRACT | Status: AC
  Administered 2017-01-05: 3 mL via RESPIRATORY_TRACT

## 2017-01-05 MED ORDER — DEXTROSE 5 % IV SOLN
2.0000 g | Freq: Two times a day (BID) | INTRAVENOUS | Status: DC
  Administered 2017-01-05 – 2017-01-13 (×16): 2 g via INTRAVENOUS
  Filled 2017-01-05 (×19): qty 2

## 2017-01-05 MED ORDER — POTASSIUM CHLORIDE CRYS ER 10 MEQ PO TBCR
10.0000 meq | EXTENDED_RELEASE_TABLET | Freq: Two times a day (BID) | ORAL | Status: DC
  Administered 2017-01-05 – 2017-01-13 (×17): 10 meq via ORAL
  Filled 2017-01-05 (×25): qty 1

## 2017-01-05 MED ORDER — METOPROLOL SUCCINATE ER 50 MG PO TB24
100.0000 mg | ORAL_TABLET | Freq: Every day | ORAL | Status: DC
  Administered 2017-01-06 – 2017-01-13 (×8): 100 mg via ORAL
  Filled 2017-01-05 (×8): qty 2

## 2017-01-05 MED ORDER — MAGNESIUM OXIDE 400 (241.3 MG) MG PO TABS
400.0000 mg | ORAL_TABLET | Freq: Every day | ORAL | Status: DC
  Administered 2017-01-05 – 2017-01-13 (×9): 400 mg via ORAL
  Filled 2017-01-05 (×9): qty 1

## 2017-01-05 MED ORDER — ALBUTEROL SULFATE (2.5 MG/3ML) 0.083% IN NEBU: 5.0000 mg | INHALATION_SOLUTION | Freq: Once | RESPIRATORY_TRACT | Status: DC

## 2017-01-05 MED ORDER — INSULIN ASPART 100 UNIT/ML ~~LOC~~ SOLN
0.0000 [IU] | Freq: Three times a day (TID) | SUBCUTANEOUS | Status: DC
  Administered 2017-01-05: 18:00:00 3 [IU] via SUBCUTANEOUS
  Administered 2017-01-06 (×2): 2 [IU] via SUBCUTANEOUS
  Administered 2017-01-07: 18:00:00 1 [IU] via SUBCUTANEOUS
  Administered 2017-01-07 – 2017-01-08 (×2): 2 [IU] via SUBCUTANEOUS
  Administered 2017-01-09 – 2017-01-10 (×3): 1 [IU] via SUBCUTANEOUS
  Administered 2017-01-11: 09:00:00 2 [IU] via SUBCUTANEOUS
  Administered 2017-01-11 – 2017-01-12 (×2): 1 [IU] via SUBCUTANEOUS
  Administered 2017-01-12: 5 [IU] via SUBCUTANEOUS
  Administered 2017-01-13: 09:00:00 1 [IU] via SUBCUTANEOUS
  Filled 2017-01-05 (×14): qty 1

## 2017-01-05 MED ORDER — HEPARIN SODIUM (PORCINE) 5000 UNIT/ML IJ SOLN
5000.0000 [IU] | Freq: Three times a day (TID) | INTRAMUSCULAR | Status: DC
  Administered 2017-01-05 – 2017-01-13 (×24): 5000 [IU] via SUBCUTANEOUS
  Filled 2017-01-05 (×24): qty 1

## 2017-01-05 MED ORDER — ALBUTEROL SULFATE (2.5 MG/3ML) 0.083% IN NEBU
2.5000 mg | INHALATION_SOLUTION | Freq: Once | RESPIRATORY_TRACT | Status: AC
  Administered 2017-01-05: 2.5 mg via RESPIRATORY_TRACT

## 2017-01-05 MED ORDER — DOXYCYCLINE HYCLATE 100 MG PO TABS
100.0000 mg | ORAL_TABLET | Freq: Once | ORAL | Status: AC
  Administered 2017-01-05: 100 mg via ORAL
  Filled 2017-01-05: qty 1

## 2017-01-05 MED ORDER — POTASSIUM CHLORIDE IN NACL 20-0.9 MEQ/L-% IV SOLN
INTRAVENOUS | Status: DC
  Administered 2017-01-05 – 2017-01-06 (×2): via INTRAVENOUS
  Filled 2017-01-05 (×5): qty 1000

## 2017-01-05 MED ORDER — VANCOMYCIN HCL IN DEXTROSE 1-5 GM/200ML-% IV SOLN
1000.0000 mg | INTRAVENOUS | Status: DC
  Administered 2017-01-05 – 2017-01-06 (×2): 1000 mg via INTRAVENOUS
  Filled 2017-01-05 (×3): qty 200

## 2017-01-05 MED ORDER — BENZONATATE 100 MG PO CAPS
200.0000 mg | ORAL_CAPSULE | Freq: Three times a day (TID) | ORAL | Status: DC | PRN
  Administered 2017-01-06 – 2017-01-11 (×9): 200 mg via ORAL
  Filled 2017-01-05 (×9): qty 2

## 2017-01-05 MED ORDER — DOCUSATE SODIUM 100 MG PO CAPS
100.0000 mg | ORAL_CAPSULE | Freq: Two times a day (BID) | ORAL | Status: DC
  Administered 2017-01-05 – 2017-01-13 (×14): 100 mg via ORAL
  Filled 2017-01-05 (×17): qty 1

## 2017-01-05 MED ORDER — ALBUTEROL SULFATE HFA 108 (90 BASE) MCG/ACT IN AERS: 2.0000 | INHALATION_SPRAY | Freq: Four times a day (QID) | RESPIRATORY_TRACT | Status: DC | PRN

## 2017-01-05 MED ORDER — ENSURE ENLIVE PO LIQD
237.0000 mL | ORAL | Status: DC
  Administered 2017-01-05 – 2017-01-13 (×8): 237 mL via ORAL

## 2017-01-05 MED ORDER — HYDRALAZINE HCL 50 MG PO TABS
50.0000 mg | ORAL_TABLET | Freq: Three times a day (TID) | ORAL | Status: DC
  Administered 2017-01-05 – 2017-01-13 (×22): 50 mg via ORAL
  Filled 2017-01-05 (×24): qty 1

## 2017-01-05 MED ORDER — PANTOPRAZOLE SODIUM 40 MG IV SOLR
40.0000 mg | Freq: Two times a day (BID) | INTRAVENOUS | Status: DC
  Administered 2017-01-05 – 2017-01-09 (×10): 40 mg via INTRAVENOUS
  Filled 2017-01-05 (×10): qty 40

## 2017-01-05 MED ORDER — METHYLPREDNISOLONE SODIUM SUCC 125 MG IJ SOLR
125.0000 mg | Freq: Once | INTRAMUSCULAR | Status: AC
  Administered 2017-01-05: 125 mg via INTRAVENOUS
  Filled 2017-01-05: qty 2

## 2017-01-05 MED ORDER — PNEUMOCOCCAL VAC POLYVALENT 25 MCG/0.5ML IJ INJ
0.5000 mL | INJECTION | INTRAMUSCULAR | Status: DC
  Filled 2017-01-05: qty 0.5

## 2017-01-05 MED ORDER — ONDANSETRON HCL 4 MG PO TABS: 4.0000 mg | ORAL_TABLET | Freq: Four times a day (QID) | ORAL | Status: DC | PRN

## 2017-01-05 MED ORDER — DEXTROSE 5 % IV SOLN
1.0000 g | Freq: Once | INTRAVENOUS | Status: AC
  Administered 2017-01-05: 1 g via INTRAVENOUS
  Filled 2017-01-05: qty 1

## 2017-01-05 MED ORDER — ACETAMINOPHEN 325 MG PO TABS: 650.0000 mg | ORAL_TABLET | Freq: Four times a day (QID) | ORAL | Status: DC | PRN

## 2017-01-05 MED ORDER — SODIUM CHLORIDE 0.9 % IV BOLUS (SEPSIS)
1000.0000 mL | Freq: Once | INTRAVENOUS | Status: AC
  Administered 2017-01-05: 1000 mL via INTRAVENOUS

## 2017-01-05 MED ORDER — IPRATROPIUM-ALBUTEROL 0.5-2.5 (3) MG/3ML IN SOLN
3.0000 mL | Freq: Once | RESPIRATORY_TRACT | Status: AC
  Administered 2017-01-05: 3 mL via RESPIRATORY_TRACT
  Filled 2017-01-05: qty 3

## 2017-01-05 MED ORDER — ALBUTEROL SULFATE (2.5 MG/3ML) 0.083% IN NEBU: 2.5000 mg | INHALATION_SOLUTION | Freq: Four times a day (QID) | RESPIRATORY_TRACT | Status: DC | PRN

## 2017-01-05 MED ORDER — IPRATROPIUM-ALBUTEROL 0.5-2.5 (3) MG/3ML IN SOLN
3.0000 mL | Freq: Four times a day (QID) | RESPIRATORY_TRACT | Status: DC
  Administered 2017-01-05 – 2017-01-13 (×32): 3 mL via RESPIRATORY_TRACT
  Filled 2017-01-05 (×31): qty 3
  Filled 2017-01-05: qty 36

## 2017-01-05 MED ORDER — IPRATROPIUM-ALBUTEROL 0.5-2.5 (3) MG/3ML IN SOLN
RESPIRATORY_TRACT | Status: AC
  Administered 2017-01-05: 3 mL via RESPIRATORY_TRACT
  Filled 2017-01-05: qty 3

## 2017-01-05 MED ORDER — ONDANSETRON HCL 4 MG/2ML IJ SOLN: 4.0000 mg | Freq: Four times a day (QID) | INTRAMUSCULAR | Status: DC | PRN

## 2017-01-05 MED ORDER — DEXTROSE 5 % IV SOLN
1.0000 g | Freq: Once | INTRAVENOUS | Status: AC
  Administered 2017-01-05: 16:00:00 1 g via INTRAVENOUS
  Filled 2017-01-05: qty 1

## 2017-01-05 MED ORDER — METHYLPREDNISOLONE SODIUM SUCC 125 MG IJ SOLR
60.0000 mg | Freq: Four times a day (QID) | INTRAMUSCULAR | Status: DC
  Administered 2017-01-05 – 2017-01-08 (×12): 60 mg via INTRAVENOUS
  Filled 2017-01-05 (×12): qty 2

## 2017-01-05 NOTE — Clinical Social Work Note (Signed)
CSW received consult for possible SNF placement. CSW will follow pending PT recommendations.  Santiago Bumpers, MSW, Latanya Presser [phone]

## 2017-01-05 NOTE — ED Provider Notes (Addendum)
With h/o   [facility] Emergency Department Provider Note  ____________________________________________  Time seen: Approximately 9:30 AM  I have reviewed the triage vital signs and the nursing notes.   HISTORY  Chief Complaint Nasal Congestion and Shortness of Breath   HPI Christina Sharp is a 71 y.o. female with h/o COPD on 2.5L [place], lung cancer not currently on chemotherapy, HTN, HLD who presents for evaluation of cough and SOB. Patient was admitted last month for PNA. Reports that she felt back to baseline but last week started to have cough again and feeling sick. Cough and SOb have gotten worse since yesterday. Bringing up yellow sputum, coughing, chills, worsening SOB. No CP, no hemoptysis, no fever, no N/V, no diarrhea. SOB is currently constant and severe.  Past Medical History:  Diagnosis Date  . Cancer ([place])    basal cell   . Emphysema of lung ([place])   . Hypercholesteremia   . Hypertension   . Lung mass   . Renal disorder    renal insufficiency    Patient Active Problem List   Diagnosis Date Noted  . Hypoxia   . Cough   . PNA (pneumonia) 12/07/2016  . Goals of care, counseling/discussion 05/26/2016  . Low TSH level 04/06/2016  . Hypokalemia 03/16/2016  . Hypomagnesemia 03/16/2016  . Low serum adrenocorticotrophic hormone (ACTH) 02/14/2016  . Encounter for antineoplastic chemotherapy 02/04/2016  . Malignant neoplasm of lung [facility])     Past Surgical History:  Procedure Laterality Date  . BASAL CELL CARCINOMA EXCISION    . COLONOSCOPY    . ENDOBRONCHIAL ULTRASOUND N/A 11/22/2015   Procedure: ENDOBRONCHIAL ULTRASOUND;  Surgeon: Flora Lipps, MD;  Location: [facility] ORS;  Service: Cardiopulmonary;  Laterality: N/A;  . FLEXIBLE BRONCHOSCOPY N/A 09/22/2015   Procedure: FLEXIBLE BRONCHOSCOPY;  Surgeon: [name], MD;  Location: [facility] ORS;  Service: Pulmonary;  Laterality: N/A;  . PORTACATH PLACEMENT Right 02/01/2016   Procedure: INSERTION  PORT-A-CATH;  Surgeon: Nestor Lewandowsky, MD;  Location: [facility] ORS;  Service: General;  Laterality: Right;  Right internal jugular vein  . VIDEO ASSISTED THORACOSCOPY (VATS)/THOROCOTOMY Right 12/14/2015   Procedure: VIDEO ASSISTED THORACOSCOPY (VATS)/THOROCOTOMY;  Surgeon: Nestor Lewandowsky, MD;  Location: [facility] ORS;  Service: Thoracic;  Laterality: Right;    Prior to Admission medications   Medication Sig Start Date End Date Taking? Authorizing Provider  amLODipine (NORVASC) 10 MG tablet Take 10 mg by mouth daily.   Yes [provider]  atorvastatin (LIPITOR) 20 MG tablet Take 20 mg daily by mouth. 11/25/16  Yes [provider]  hydrALAZINE (APRESOLINE) 50 MG tablet Take 50 mg by mouth 3 (three) times daily.   Yes [provider]  KLOR-CON 10 10 MEQ tablet TAKE 1 TABLET (10 MEQ TOTAL) BY MOUTH 2 (TWO) TIMES DAILY. 08/13/16  Yes Corcoran, Melissa C, MD  magnesium oxide (MAG-OX) 400 MG tablet TAKE 1 TABLET (400 MG TOTAL) BY MOUTH DAILY. 08/02/16  Yes Lloyd Huger, MD  metoprolol succinate (TOPROL-XL) 100 MG 24 hr tablet Take 100 mg by mouth daily. Take with or immediately following a meal.   Yes [provider]  sodium bicarbonate 650 MG tablet Take 1,300 mg by mouth daily.    Yes [provider]  albuterol (PROVENTIL HFA;VENTOLIN HFA) 108 (90 Base) MCG/ACT inhaler Inhale 2 puffs every 6 (six) hours as needed into the lungs for wheezing or shortness of breath. 12/10/16   Loletha Grayer, MD  albuterol (PROVENTIL) (2.5 MG/3ML) 0.083% nebulizer solution Take  3 mLs (2.5 mg total) every 4 (four) hours as needed by nebulization for wheezing or shortness of breath. 12/10/16   Loletha Grayer, MD  benzonatate (TESSALON) 200 MG capsule Take 1 capsule (200 mg total) by mouth 3 (three) times daily as needed for cough. 12/27/16   Lequita Asal, MD  diphenhydrAMINE (BENADRYL) 50 MG tablet Take 1 tablet (50 mg total) by mouth every 8 (eight) hours as needed for itching  or sleep. 12/24/16   Jacquelin Hawking, NP  feeding supplement, ENSURE ENLIVE, (ENSURE ENLIVE) LIQD Take 237 mLs daily by mouth. 12/10/16   Loletha Grayer, MD  metoprolol tartrate (LOPRESSOR) 25 MG tablet Take 1 tablet (25 mg total) by mouth 4 (four) times daily as needed. Patient not taking: Reported on 01/05/2017 12/18/16   Lequita Asal, MD  mometasone-formoterol (DULERA) 100-5 MCG/ACT AERO Inhale 2 puffs 2 (two) times daily into the lungs. Patient not taking: Reported on 12/11/2016 12/11/16   Loletha Grayer, MD  ondansetron (ZOFRAN) 8 MG tablet Take 1 tablet every 8 (eight) hours as needed by mouth.    [provider]  predniSONE (DELTASONE) 20 MG tablet Take 2 tablets (40 mg total) daily with breakfast by mouth. Patient not taking: Reported on 01/05/2017 12/11/16   Loletha Grayer, MD  predniSONE (STERAPRED UNI-PAK 21 TAB) 10 MG (21) TBPK tablet Take as prescribed. Patient not taking: Reported on 01/05/2017 12/24/16   Jacquelin Hawking, NP    Allergies Augmentin [amoxicillin-pot clavulanate] and Penicillins  Family History  Problem Relation Age of Onset  . Alzheimer's disease Mother   . Heart disease Father   . Heart attack Brother     Social History Social History   Tobacco Use  . Smoking status: Former Smoker    Packs/day: 0.50    Years: 45.00    Pack years: 22.50    Types: Cigarettes    Last attempt to quit: 11/06/2015    Years since quitting: 1.1  . Smokeless tobacco: Never Used  . Tobacco comment: hasn't smoked since 11-06-15  Substance Use Topics  . Alcohol use: Yes    Comment: rare  . Drug use: No    Review of Systems  Constitutional: Negative for fever. + chills Eyes: Negative for visual changes. ENT: Negative for sore throat. Neck: No neck pain  Cardiovascular: Negative for chest pain. Respiratory: + shortness of breath, cough Gastrointestinal: Negative for abdominal pain, vomiting or diarrhea. Genitourinary: Negative for  dysuria. Musculoskeletal: Negative for back pain. Skin: Negative for rash. Neurological: Negative for headaches, weakness or numbness. Psych: No SI or HI  ____________________________________________   PHYSICAL EXAM:  VITAL SIGNS: ED Triage Vitals  Enc Vitals Group     BP 01/05/17 0850 109/79     Pulse Rate 01/05/17 0850 97     Resp 01/05/17 0850 19     Temp 01/05/17 0850 (!) 97.3 F (36.3 C)     Temp Source 01/05/17 0850 Oral     SpO2 01/05/17 0848 (!) 88 %     Weight 01/05/17 0851 137 lb (62.1 kg)     Height 01/05/17 0851 5\' 3"  (1.6 m)     Head Circumference --      Peak Flow --      Pain Score 01/05/17 0848 3     Pain Loc --      Pain Edu? --      Excl. in [place]? --     Constitutional: Alert and oriented, moderate respiratory distress.  HEENT:  Head: Normocephalic and atraumatic.         Eyes: Conjunctivae are normal. Sclera is non-icteric.       Mouth/Throat: Mucous membranes are dry.       Neck: Supple with no signs of meningismus. Cardiovascular: Regular rate and rhythm. No murmurs, gallops, or rubs. 2+ symmetrical distal pulses are present in all extremities. No JVD. Respiratory: Tachypneic, sating 88% on 4L [place], diffuse coarse rhonchi and wheezes throughout Gastrointestinal: Soft, non tender, and non distended with positive bowel sounds. No rebound or guarding. Musculoskeletal: Trace pitting edema Neurologic: Normal speech and language. Face is symmetric. Moving all extremities. No gross focal neurologic deficits are appreciated. Skin: Skin is warm, dry and intact. No rash noted. Psychiatric: Mood and affect are normal. Speech and behavior are normal.  ____________________________________________   LABS (all labs ordered are listed, but only abnormal results are displayed)  Labs Reviewed  CBC WITH DIFFERENTIAL/PLATELET - Abnormal; Notable for the following components:      Result Value   WBC 21.9 (*)    RBC 3.75 (*)    Hemoglobin 11.7 (*)    RDW 18.2  (*)    Neutro Abs 19.6 (*)    Lymphs Abs 0.9 (*)    Monocytes Absolute 1.3 (*)    All other components within normal limits  BASIC METABOLIC PANEL - Abnormal; Notable for the following components:   Glucose, Bld 153 (*)    GFR calc non Af Amer 56 (*)    All other components within normal limits  CULTURE, BLOOD (ROUTINE X 2)  CULTURE, BLOOD (ROUTINE X 2)  TROPONIN I  LACTIC ACID, PLASMA   ____________________________________________  EKG  ED ECG REPORT I, Rudene Re, the attending physician, personally viewed and interpreted this ECG.  Normal sinus rhythm, rate of 98, normal intervals, normal axis, no ST elevations or depressions.  ____________________________________________  RADIOLOGY  CXR:  Persistent dense bilateral airspace disease. ____________________________________________   PROCEDURES  Procedure(s) performed: None Procedures Critical Care performed: yes  CRITICAL CARE Performed by: Rudene Re  ?  Total critical care time: 45 min  Critical care time was exclusive of separately billable procedures and treating other patients.  Critical care was necessary to treat or prevent imminent or life-threatening deterioration.  Critical care was time spent personally by me on the following activities: development of treatment plan with patient and/or surrogate as well as nursing, discussions with consultants, evaluation of patient's response to treatment, examination of patient, obtaining history from patient or surrogate, ordering and performing treatments and interventions, ordering and review of laboratory studies, ordering and review of radiographic studies, pulse oximetry and re-evaluation of patient's condition.  ____________________________________________   INITIAL IMPRESSION / ASSESSMENT AND PLAN / ED COURSE   71 y.o. female with h/o COPD on 2.5L [place], lung cancer not currently on chemotherapy, HTN, HLD who presents for evaluation of  progressively worsening productive cough and SOB. Patient is in moderate respiratory distress, hypoxic 4 L, with diffuse expiratory wheezes and coarse rhonchi bilaterally. Started on 3 duo nebs and Solu-Medrol. Chest x-ray is pending. We'll check labs. Presentation concerning for COPD exacerbation versus pneumonia versus bronchitis.    _________________________ 10:56 AM on 01/05/2017 -----------------------------------------  Presentation concerning for sepsis and HCAP with increased work of breathing, tachypnea, worsening oxygen requirement, chest x-ray concerning for persistent dense bilateral airspace disease, and white count of 21. Patient given IVF, cefepime, vancomycin, doxy, solumedrol, duonebs and will be admitted to Hospitalist.   As part of my medical decision making,  I reviewed the following data within the [place] History obtained from family, Nursing notes reviewed and incorporated, Labs reviewed , EKG interpreted , Old EKG reviewed, Old chart reviewed, Radiograph reviewed , Discussed with admitting physician , Notes from prior ED visits and [place] Controlled Substance Database    Pertinent labs & imaging results that were available during my care of the patient were reviewed by me and considered in my medical decision making (see chart for details).    ____________________________________________   FINAL CLINICAL IMPRESSION(S) / ED DIAGNOSES  Final diagnoses:  Sepsis, due to unspecified organism ([place])  HCAP (healthcare-associated pneumonia)  Acute respiratory failure with hypoxia ([place])      NEW MEDICATIONS STARTED DURING THIS VISIT:  ED Discharge Orders    None       Note:  This document was prepared using Dragon voice recognition software and may include unintentional dictation errors.    Rudene Re, MD 01/05/17 [place], [place], MD 01/05/17 [phone]

## 2017-01-05 NOTE — Progress Notes (Addendum)
Pharmacy Antibiotic Note  Christina Sharp is a 71 y.o. female admitted on 01/05/2017 with pneumonia.  Pharmacy has been consulted for cefepime and vancomycin dosing.  Plan: Vancomycin 1000mg  IV every 24 hours.  Goal trough 15-20 mcg/mL. cefepime 2gm iv q12h  Vancomycin trough 12/11@1930    Height: 5\' 3"  (160 cm) Weight: 137 lb (62.1 kg) IBW/kg (Calculated) : 52.4  Temp (24hrs), Avg:97.3 F (36.3 C), Min:97.3 F (36.3 C), Max:97.3 F (36.3 C)  Recent Labs  Lab 01/05/17 0929  WBC 21.9*  CREATININE 0.99  LATICACIDVEN 1.1    Estimated Creatinine Clearance: 42.5 mL/min (by C-G formula based on SCr of 0.99 mg/dL).    Allergies  Allergen Reactions  . Augmentin [Amoxicillin-Pot Clavulanate] Hives  . Penicillins Hives    Has patient had a PCN reaction causing immediate rash, facial/tongue/throat swelling, SOB or lightheadedness with hypotension: No Has patient had a PCN reaction causing severe rash involving mucus membranes or skin necrosis: No Has patient had a PCN reaction that required hospitalization No Has patient had a PCN reaction occurring within the last 10 years: Yes If all of the above answers are "NO", then may proceed with Cephalosporin use.      Antimicrobials this admission: Anti-infectives (From admission, onward)   Start     Dose/Rate Route Frequency Ordered Stop   01/05/17 2300  ceFEPIme (MAXIPIME) 2 g in dextrose 5 % 50 mL IVPB     2 g 100 mL/hr over 30 Minutes Intravenous Every 12 hours 01/05/17 1152     01/05/17 2000  vancomycin (VANCOCIN) IVPB 1000 mg/200 mL premix     1,000 mg 200 mL/hr over 60 Minutes Intravenous Every 24 hours 01/05/17 1154     01/05/17 1200  ceFEPIme (MAXIPIME) 1 g in dextrose 5 % 50 mL IVPB     1 g 100 mL/hr over 30 Minutes Intravenous  Once 01/05/17 1151     01/05/17 1030  ceFEPIme (MAXIPIME) 1 g in dextrose 5 % 50 mL IVPB     1 g 100 mL/hr over 30 Minutes Intravenous  Once 01/05/17 1018 01/05/17 1138   01/05/17 1030   vancomycin (VANCOCIN) IVPB 1000 mg/200 mL premix     1,000 mg 200 mL/hr over 60 Minutes Intravenous  Once 01/05/17 1018     01/05/17 1030  doxycycline (VIBRA-TABS) tablet 100 mg     100 mg Oral  Once 01/05/17 1018 01/05/17 1110       Microbiology results: No results found for this or any previous visit (from the past 240 hour(s)).   Thank you for allowing pharmacy to be a part of this patient's care.  Donna Christen Allona Gondek 01/05/2017 11:54 AM

## 2017-01-05 NOTE — Progress Notes (Unsigned)
[place] NOTE  Patient Care Team: Anthonette Legato, MD as PCP - General (Internal Medicine) Nestor Lewandowsky, MD as Referring Physician (Cardiothoracic Surgery) Flora Lipps, MD as Consulting Physician (Pulmonary Disease) [name], MD as Consulting Physician (Pulmonary Disease) Lissa Morales, MD as Referring Physician (Internal Medicine)  CHIEF COMPLAINTS/PURPOSE OF CONSULTATION:  ***  HISTORY OF PRESENTING ILLNESS:  Christina Sharp 71 y.o.  female   Last chemo-gem- cycle #1 day-15 on 11/09. Last CT scan nov 9th- showed bi progression.   ROS: A complete 10 point review of system is done which is negative except mentioned above in history of present illness  MEDICAL HISTORY:  Past Medical History:  Diagnosis Date  . Cancer ([place])    basal cell   . Emphysema of lung ([place])   . Hypercholesteremia   . Hypertension   . Lung mass   . Renal disorder    renal insufficiency    SURGICAL HISTORY: Past Surgical History:  Procedure Laterality Date  . BASAL CELL CARCINOMA EXCISION    . COLONOSCOPY    . ENDOBRONCHIAL ULTRASOUND N/A 11/22/2015   Procedure: ENDOBRONCHIAL ULTRASOUND;  Surgeon: Flora Lipps, MD;  Location: [facility];  Service: Cardiopulmonary;  Laterality: N/A;  . FLEXIBLE BRONCHOSCOPY N/A 09/22/2015   Procedure: FLEXIBLE BRONCHOSCOPY;  Surgeon: [name], MD;  Location: [facility];  Service: Pulmonary;  Laterality: N/A;  . PORTACATH PLACEMENT Right 02/01/2016   Procedure: INSERTION PORT-A-CATH;  Surgeon: Nestor Lewandowsky, MD;  Location: [facility];  Service: General;  Laterality: Right;  Right internal jugular vein  . VIDEO ASSISTED THORACOSCOPY (VATS)/THOROCOTOMY Right 12/14/2015   Procedure: VIDEO ASSISTED THORACOSCOPY (VATS)/THOROCOTOMY;  Surgeon: Nestor Lewandowsky, MD;  Location: [facility];  Service: Thoracic;  Laterality: Right;    SOCIAL HISTORY: Social History   Socioeconomic History  . Marital status: Single    Spouse name: Not on file   . Number of children: Not on file  . Years of education: Not on file  . Highest education level: Not on file  Social Needs  . Financial resource strain: Not hard at all  . Food insecurity - worry: Never true  . Food insecurity - inability: Never true  . Transportation needs - medical: No  . Transportation needs - non-medical: No  Occupational History  . Not on file  Tobacco Use  . Smoking status: Former Smoker    Packs/day: 0.50    Years: 45.00    Pack years: 22.50    Types: Cigarettes    Last attempt to quit: 11/06/2015    Years since quitting: 1.1  . Smokeless tobacco: Never Used  . Tobacco comment: hasn't smoked since 11-06-15  Substance and Sexual Activity  . Alcohol use: Yes    Comment: rare  . Drug use: No  . Sexual activity: Not on file  Other Topics Concern  . Not on file  Social History Narrative  . Not on file    FAMILY HISTORY: Family History  Problem Relation Age of Onset  . Alzheimer's disease Mother   . Heart disease Father   . Heart attack Brother     ALLERGIES:  is allergic to augmentin [amoxicillin-pot clavulanate] and penicillins.  MEDICATIONS:  No current facility-administered medications for this visit.    No current outpatient medications on file.   Facility-Administered Medications Ordered in Other Visits  Medication Dose Route Frequency Provider Last Rate Last Dose  . 0.9 % NaCl with KCl 20 mEq/ L  infusion   Intravenous Continuous  Idelle Crouch, MD 75 mL/hr at 01/05/17 1412    . acetaminophen (TYLENOL) tablet 650 mg  650 mg Oral Q6H PRN Idelle Crouch, MD       Or  . acetaminophen (TYLENOL) suppository 650 mg  650 mg Rectal Q6H PRN Idelle Crouch, MD      . albuterol (PROVENTIL) (2.5 MG/3ML) 0.083% nebulizer solution 2.5 mg  2.5 mg Nebulization Q6H PRN Idelle Crouch, MD      . Derrill Memo ON 01/06/2017] amLODipine (NORVASC) tablet 10 mg  10 mg Oral Daily Idelle Crouch, MD      . atorvastatin (LIPITOR) tablet 20 mg  20 mg Oral  q1800 Idelle Crouch, MD      . benzonatate (TESSALON) capsule 200 mg  200 mg Oral TID PRN Idelle Crouch, MD      . bisacodyl (DULCOLAX) suppository 10 mg  10 mg Rectal Daily PRN Idelle Crouch, MD      . ceFEPIme (MAXIPIME) 1 g in dextrose 5 % 50 mL IVPB  1 g Intravenous Once Coffee, Donna Christen, RPH      . ceFEPIme (MAXIPIME) 2 g in dextrose 5 % 50 mL IVPB  2 g Intravenous Q12H Coffee, Donna Christen, [facility]      . diphenhydrAMINE (BENADRYL) capsule 50 mg  50 mg Oral Q8H PRN Idelle Crouch, MD      . docusate sodium (COLACE) capsule 100 mg  100 mg Oral BID Idelle Crouch, MD   100 mg at 01/05/17 1438  . feeding supplement (ENSURE ENLIVE) (ENSURE ENLIVE) liquid 237 mL  237 mL Oral Q24H Idelle Crouch, MD   237 mL at 01/05/17 1444  . heparin injection 5,000 Units  5,000 Units Subcutaneous Q8H Idelle Crouch, MD   5,000 Units at 01/05/17 1438  . heparin lock flush 100 unit/mL  500 Units Intravenous Once Corcoran, Melissa C, MD      . hydrALAZINE (APRESOLINE) tablet 50 mg  50 mg Oral TID Idelle Crouch, MD   50 mg at 01/05/17 1438  . insulin aspart (novoLOG) injection 0-9 Units  0-9 Units Subcutaneous TID WC Idelle Crouch, MD      . ipratropium-albuterol (DUONEB) 0.5-2.5 (3) MG/3ML nebulizer solution 3 mL  3 mL Nebulization QID Idelle Crouch, MD   3 mL at 01/05/17 1503  . magnesium oxide (MAG-OX) tablet 400 mg  400 mg Oral Daily Idelle Crouch, MD   400 mg at 01/05/17 1438  . methylPREDNISolone sodium succinate (SOLU-MEDROL) 125 mg/2 mL injection 60 mg  60 mg Intravenous Q6H Idelle Crouch, MD      . Derrill Memo ON 01/06/2017] metoprolol succinate (TOPROL-XL) 24 hr tablet 100 mg  100 mg Oral Daily Idelle Crouch, MD      . mometasone-formoterol (DULERA) 100-5 MCG/ACT inhaler 2 puff  2 puff Inhalation BID Idelle Crouch, MD      . ondansetron [facility]) tablet 4 mg  4 mg Oral Q6H PRN Idelle Crouch, MD       Or  . ondansetron (ZOFRAN) injection 4 mg  4 mg Intravenous Q6H PRN  Idelle Crouch, MD      . pantoprazole (PROTONIX) injection 40 mg  40 mg Intravenous Q12H Idelle Crouch, MD   40 mg at 01/05/17 1437  . [START ON 01/06/2017] pneumococcal 23 valent vaccine (PNU-IMMUNE) injection 0.5 mL  0.5 mL Intramuscular Tomorrow-1000 Idelle Crouch, MD      . potassium chloride (K-DUR)  CR tablet 10 mEq  10 mEq Oral BID Idelle Crouch, MD   10 mEq at 01/05/17 1439  . sodium bicarbonate tablet 1,300 mg  1,300 mg Oral Daily Idelle Crouch, MD   1,300 mg at 01/05/17 1438  . vancomycin (VANCOCIN) IVPB 1000 mg/200 mL premix  1,000 mg Intravenous Q24H Coffee, Donna Christen, [facility]          .  PHYSICAL EXAMINATION:  There were no vitals filed for this visit. There were no vitals filed for this visit.  GENERAL: Well-nourished well-developed; Alert, no distress and comfortable.   *** EYES: no pallor or icterus OROPHARYNX: no thrush or ulceration. NECK: supple, no masses felt LYMPH:  no palpable lymphadenopathy in the cervical, axillary or inguinal regions LUNGS: decreased breath sounds to auscultation at bases and  No wheeze or crackles HEART/CVS: regular rate & rhythm and no murmurs; No lower extremity edema ABDOMEN: abdomen soft, non-tender and normal bowel sounds Musculoskeletal:no cyanosis of digits and no clubbing  PSYCH: alert & oriented x 3 with fluent speech NEURO: no focal motor/sensory deficits SKIN:  no rashes or significant lesions  LABORATORY DATA:  I have reviewed the data as listed Lab Results  Component Value Date   WBC 21.9 (H) 01/05/2017   HGB 11.7 (L) 01/05/2017   HCT 35.9 01/05/2017   MCV 95.6 01/05/2017   PLT 395 01/05/2017   Recent Labs    12/07/16 1528 12/18/16 0840 12/24/16 0838 01/05/17 0929  NA 137 136 134* 137  K 4.0 3.8 3.9 3.5  CL 99* 104 100* 101  CO2 24 22 22 24   GLUCOSE 121* 115* 183* 153*  BUN 22* 13 16 16   CREATININE 1.17* 1.04* 1.10* 0.99  CALCIUM 8.6* 8.9 9.1 9.0  GFRNONAA 45* 52* 49* 56*  GFRAA 53* >60 57* >60   PROT 6.8 7.0 7.3  --   ALBUMIN 2.8* 3.3* 3.6  --   AST 33 23 22  --   ALT 24 29 15   --   ALKPHOS 127* 123 99  --   BILITOT 1.0 0.7 1.1  --     RADIOGRAPHIC STUDIES: I have personally reviewed the radiological images as listed and agreed with the findings in the report. Dg Chest 2 View  Result Date: 12/18/2016 CLINICAL DATA:  History of lung carcinoma, pneumonia, followup EXAM: CHEST  2 VIEW COMPARISON:  CT chest of 12/07/2016 and chest x-ray of the same day FINDINGS: There is little interval change to very slight worsening of patchy airspace disease bilaterally left left-greater-than-right most consistent with multifocal pneumonia. Small right effusion cannot be excluded. Mediastinal hilar contours are unchanged and heart size is within normal limits. Right-sided Port-A-Cath is present with tip overlying the lower SVC. No acute bony abnormality is seen. IMPRESSION: 1. Little change to slight worsening of patchy airspace disease bilaterally most consistent with multifocal pneumonia. 2. Port-A-Cath tip overlies the lower SVC. Electronically Signed   By: Ivar Drape M.D.   On: 12/18/2016 15:07   Dg Chest 2 View  Result Date: 12/07/2016 CLINICAL DATA:  Short of breath.  Lung cancer EXAM: CHEST  2 VIEW COMPARISON:  CT chest 07/24/2016, 02/09/2016. Chest x-ray 02/01/2016. PET 04/24/2016 FINDINGS: Progression of extensive bilateral lung opacities. The air bronchograms associated with these opacities bilaterally. No effusion or heart failure. Port-A-Cath tip in the SVC at the [facility] /RA junction. IMPRESSION: Extensive bilateral airspace disease. This has progressed from prior studies. In this patient with lung cancer, this may represent progression of metastatic disease.  Pneumonia is possible particularly in the left upper lobe. Electronically Signed   By: Franchot Gallo M.D.   On: 12/07/2016 15:23   Ct Chest W Contrast  Result Date: 12/07/2016 CLINICAL DATA:  Followup lung cancer. EXAM: CT CHEST WITH  CONTRAST TECHNIQUE: Multidetector CT imaging of the chest was performed during intravenous contrast administration. CONTRAST:  89mL ISOVUE-300 IOPAMIDOL (ISOVUE-300) INJECTION 61% COMPARISON:  07/24/2016 FINDINGS: Cardiovascular: The heart size appears normal. Aortic atherosclerosis identified. Calcification within the RCA, LAD and left circumflex coronary artery is identified. Mediastinum/Nodes: The trachea appears patent and is midline. Normal appearance of the esophagus. Mediastinal adenopathy is identified. Index left-sided pre-vascular node measures 1.3 cm, image 49 of series 2. Previously 0.8 cm. Index right paratracheal lymph node Measures 1.4 cm, image 36 of series 2. Previously 1.2 cm. Lower right paratracheal lymph node measures 1.4 cm, image 43 of series 2. Previously 1.0 cm. 1.6 cm sub- carinal lymph node is unchanged. Lungs/Pleura: Advanced smoking related changes are identified throughout both lungs. There is no pleural effusion. Interval progression of bilateral confluent areas of masslike architectural distortion compatible with multifocal pulmonary adenocarcinoma. Significant worsening aeration to the left upper lobe. Index mass within the lingula measures 6.3 by 4.6 cm, image 78 of series 3. Previously this measured 1.8 by 3.6 cm. Left lower lobe peripheral subpleural mass measures 5.7 cm 5.7 x 3.6 cm, image 94 of series 3. Previously 1.7 x 2.0 cm. Posterior left lower lobe the lesion measures 3.2 by 4.0 cm, image 93 of series 3. Previously 0.8 by 0.8 cm. Large area of masslike consolidation and ground-glass attenuation in the left upper lobe Measures 6.2 x 12.8 cm. Previously index nodule in this area measured 1.1 x 1.1 cm. Index lesion within the posterior and lateral left upper lobe has solid, cystic and ground-glass components measuring 9.4 x 5.8 cm, image 58 of series 3. Previous 5.9 by 4.0 cm. Upper Abdomen: No acute abnormality. Musculoskeletal: No chest wall abnormality. No acute or  significant osseous findings. IMPRESSION: 1. There is been significant interval worsening aeration of both lungs. 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 Findings are also compatible with progression of disease with superimposed multifocal bilateral infection. 2. Progression of mediastinal adenopathy. 3. Aortic Atherosclerosis (ICD10-I70.0) and Emphysema (ICD10-J43.9). 4. Multi vessel coronary artery calcifications. Electronically Signed   By: Kerby Moors M.D.   On: 12/07/2016 16:29   Dg Chest Port 1 View  Result Date: 01/05/2017 CLINICAL DATA:  Short of breath, congestion.  Lung carcinoma EXAM: PORTABLE CHEST 1 VIEW COMPARISON:  Radiograph 12/18/2016, chest CT 12/07/2016 FINDINGS: Power port in the RIGHT chest. Stable cardiac silhouette. There is dense airspace disease throughout the LEFT lung and involving the RIGHT lower lobe not changed from comparison exam. Some improvement in airspace disease in the RIGHT upper lobe. No pneumothorax. IMPRESSION: Persistent dense bilateral airspace disease. Electronically Signed   By: Suzy Bouchard M.D.   On: 01/05/2017 10:31   Ct Outside Films Chest  Result Date: 12/12/2016 This examination belongs to an outside facility and is stored here for comparison purposes only.  Contact the originating outside institution for any associated report or interpretation.  Ct Outside Films Body  Result Date: 12/12/2016 This examination belongs to an outside facility and is stored here for comparison purposes only.  Contact the originating outside institution for any associated report or interpretation.   ASSESSMENT & PLAN:  ***  All questions were answered. The patient knows to call the clinic with  any problems, questions or concerns.       Cammie Sickle, MD 01/05/2017 3:38 PM

## 2017-01-05 NOTE — Progress Notes (Signed)
Physical Therapy Evaluation Patient Details Name: TOBA CLAUDIO MRN: [id number] DOB: [birth year] Today's Date: 01/05/2017   History of Present Illness  Patient is a 71 y.o. female admitted on 08 DEC with worsening SOB x1 week. PMH includes malignant neoplasm of lung, HCAP, SIRS, HTN, COPD, and CKD. Patient is on 2.5 L O2 at home.  Clinical Impression  Patient is a pleasant female admitted for above listed reasons. After speaking with RN prior to commencing evaluation, it was recommended that PT defer standing assessment, as patient's oxygen saturations dropped to 70s during bathroom transfer earlier. PT assessed oxygen saturations throughout bed mobility, remaining >90% on 6L O2. Upon strength testing, oxygen saturations dropped to 86%, requiring a couple of minutes to return >90%. Because patient is usually on 2.5 L O2 at home, it is believed that she will continue to benefit from progressive PT to improve cardiopulmonary endurance. She may benefit from having a home pulse oximeter to monitor levels and decrease symptom exacerbation. PT will further assess oxygen saturations with dynamic mobility when medically ready.    Follow Up Recommendations Home health PT    Equipment Recommendations  Other (comment)(Pulse oximeter)    Recommendations for Other Services       Precautions / Restrictions Precautions Precautions: None Restrictions Weight Bearing Restrictions: No      Mobility  Bed Mobility Overal bed mobility: Modified Independent             General bed mobility comments: Patient limited to rolling at today's session due to oxygen saturations dropping. Remained >90% on 6L O2.  Transfers                 General transfer comment: Deferred  Ambulation/Gait             General Gait Details: Deferred  Stairs            Wheelchair Mobility    Modified Rankin (Stroke Patients Only)       Balance                                              Pertinent Vitals/Pain Pain Assessment: No/denies pain    Home Living Family/patient expects to be discharged to:: Private residence Living Arrangements: Non-relatives/Friends Available Help at Discharge: Friend(s);Available PRN/intermittently Type of Home: House Home Access: Stairs to enter Entrance Stairs-Rails: Can reach both Entrance Stairs-Number of Steps: 5 Home Layout: One level Home Equipment: None      Prior Function Level of Independence: Needs assistance   Gait / Transfers Assistance Needed: Community distances with O2  ADL's / Homemaking Assistance Needed: Assists with help of friend        Hand Dominance        Extremity/Trunk Assessment   Upper Extremity Assessment Upper Extremity Assessment: Overall WFL for tasks assessed    Lower Extremity Assessment Lower Extremity Assessment: Generalized weakness;Overall WFL for tasks assessed       Communication   Communication: No difficulties  Cognition Arousal/Alertness: Awake/alert Behavior During Therapy: WFL for tasks assessed/performed Overall Cognitive Status: Within Functional Limits for tasks assessed                                        General Comments      Exercises  Assessment/Plan    PT Assessment Patient needs continued PT services  PT Problem List Decreased strength;Decreased activity tolerance;Decreased mobility;Cardiopulmonary status limiting activity       PT Treatment Interventions Gait training;Stair training;Functional mobility training;Therapeutic activities;Therapeutic exercise;Balance training;Patient/family education    PT Goals (Current goals can be found in the Care Plan section)  Acute Rehab PT Goals Patient Stated Goal: To get stronger PT Goal Formulation: With patient Time For Goal Achievement: 01/19/17 Potential to Achieve Goals: Good    Frequency Min 2X/week   Barriers to discharge        Co-evaluation                AM-PAC PT "6 Clicks" Daily Activity  Outcome Measure Difficulty turning over in bed (including adjusting bedclothes, sheets and blankets)?: None Difficulty moving from lying on back to sitting on the side of the bed? : A Little Difficulty sitting down on and standing up from a chair with arms (e.g., wheelchair, bedside commode, etc,.)?: A Little Help needed moving to and from a bed to chair (including a wheelchair)?: A Little Help needed walking in hospital room?: A Little Help needed climbing 3-5 steps with a railing? : A Little 6 Click Score: 19    End of Session Equipment Utilized During Treatment: Oxygen Activity Tolerance: Patient tolerated treatment well;Treatment limited secondary to medical complications (Comment);Other (comment)(Oxygen saturations) Patient left: in bed;with call bell/phone within reach;with family/visitor present   PT Visit Diagnosis: Muscle weakness (generalized) (M62.81);Difficulty in walking, not elsewhere classified (R26.2)    Time: [id number] PT Time Calculation (min) (ACUTE ONLY): 11 min   Charges:   PT Evaluation $PT Eval Low Complexity: 1 Low     PT G Codes:   PT G-Codes **NOT FOR INPATIENT CLASS** Functional Assessment Tool Used: AM-PAC 6 Clicks Basic Mobility;Clinical judgement Functional Limitation: Mobility: Walking and moving around Mobility: Walking and Moving Around Current Status (E0923): At least 60 percent but less than 80 percent impaired, limited or restricted Mobility: Walking and Moving Around Goal Status [phone]): At least 20 percent but less than 40 percent impaired, limited or restricted     Dorice Lamas, PT, DPT, COMT 01/05/2017, 4:50 PM

## 2017-01-05 NOTE — H&P (Signed)
History and Physical    Christina Sharp EUM:[id number] DOB: 1946/01/23 DOA: 01/05/2017  Referring physician: Dr. Alfred Levins PCP: Anthonette Legato, MD  Specialists: Dr. Mike Gip  Chief Complaint: SOB and cough  HPI: Christina Sharp is a 71 y.o. female has a past medical history significant for HTN, COPD, CKD, and lung cancer admitted last month for CAP now with 1 week hx of progressive SOB and cough. In ER, pt markedly hypoxic. Tachycardic. WBC elevated. CXR shows bialteral pneumonia. Afebrile. No N/V/D. Denies CP. She is now admitted.  Review of Systems: The patient denies anorexia, fever, weight loss,, vision loss, decreased hearing, hoarseness, chest pain, syncope, peripheral edema, balance deficits, hemoptysis, abdominal pain, melena, hematochezia, severe indigestion/heartburn, hematuria, incontinence, genital sores, muscle weakness, suspicious skin lesions, transient blindness, difficulty walking, depression, unusual weight change, abnormal bleeding, enlarged lymph nodes, angioedema, and breast masses.   Past Medical History:  Diagnosis Date  . Cancer ([place])    basal cell   . Emphysema of lung ([place])   . Hypercholesteremia   . Hypertension   . Lung mass   . Renal disorder    renal insufficiency   Past Surgical History:  Procedure Laterality Date  . BASAL CELL CARCINOMA EXCISION    . COLONOSCOPY    . ENDOBRONCHIAL ULTRASOUND N/A 11/22/2015   Procedure: ENDOBRONCHIAL ULTRASOUND;  Surgeon: Flora Lipps, MD;  Location: [facility] ORS;  Service: Cardiopulmonary;  Laterality: N/A;  . FLEXIBLE BRONCHOSCOPY N/A 09/22/2015   Procedure: FLEXIBLE BRONCHOSCOPY;  Surgeon: [name], MD;  Location: [facility];  Service: Pulmonary;  Laterality: N/A;  . PORTACATH PLACEMENT Right 02/01/2016   Procedure: INSERTION PORT-A-CATH;  Surgeon: Nestor Lewandowsky, MD;  Location: [facility];  Service: General;  Laterality: Right;  Right internal jugular vein  . VIDEO ASSISTED THORACOSCOPY (VATS)/THOROCOTOMY Right  12/14/2015   Procedure: VIDEO ASSISTED THORACOSCOPY (VATS)/THOROCOTOMY;  Surgeon: Nestor Lewandowsky, MD;  Location: [facility];  Service: Thoracic;  Laterality: Right;   Social History:  reports that she quit smoking about 14 months ago. Her smoking use included cigarettes. She has a 22.50 pack-year smoking history. she has never used smokeless tobacco. She reports that she drinks alcohol. She reports that she does not use drugs.  Allergies  Allergen Reactions  . Augmentin [Amoxicillin-Pot Clavulanate] Hives  . Penicillins Hives    Has patient had a PCN reaction causing immediate rash, facial/tongue/throat swelling, SOB or lightheadedness with hypotension: No Has patient had a PCN reaction causing severe rash involving mucus membranes or skin necrosis: No Has patient had a PCN reaction that required hospitalization No Has patient had a PCN reaction occurring within the last 10 years: Yes If all of the above answers are "NO", then may proceed with Cephalosporin use.      Family History  Problem Relation Age of Onset  . Alzheimer's disease Mother   . Heart disease Father   . Heart attack Brother     Prior to Admission medications   Medication Sig Start Date End Date Taking? Authorizing Provider  amLODipine (NORVASC) 10 MG tablet Take 10 mg by mouth daily.   Yes [provider]  atorvastatin (LIPITOR) 20 MG tablet Take 20 mg daily by mouth. 11/25/16  Yes [provider]  hydrALAZINE (APRESOLINE) 50 MG tablet Take 50 mg by mouth 3 (three) times daily.   Yes [provider]  KLOR-CON 10 10 MEQ tablet TAKE 1 TABLET (10 MEQ TOTAL) BY MOUTH 2 (TWO) TIMES DAILY. 08/13/16  Yes Lequita Asal, MD  magnesium  oxide (MAG-OX) 400 MG tablet TAKE 1 TABLET (400 MG TOTAL) BY MOUTH DAILY. 08/02/16  Yes Lloyd Huger, MD  metoprolol succinate (TOPROL-XL) 100 MG 24 hr tablet Take 100 mg by mouth daily. Take with or immediately following a meal.   Yes [provider]   sodium bicarbonate 650 MG tablet Take 1,300 mg by mouth daily.    Yes [provider]  albuterol (PROVENTIL HFA;VENTOLIN HFA) 108 (90 Base) MCG/ACT inhaler Inhale 2 puffs every 6 (six) hours as needed into the lungs for wheezing or shortness of breath. 12/10/16   Loletha Grayer, MD  albuterol (PROVENTIL) (2.5 MG/3ML) 0.083% nebulizer solution Take 3 mLs (2.5 mg total) every 4 (four) hours as needed by nebulization for wheezing or shortness of breath. 12/10/16   Loletha Grayer, MD  benzonatate (TESSALON) 200 MG capsule Take 1 capsule (200 mg total) by mouth 3 (three) times daily as needed for cough. 12/27/16   Lequita Asal, MD  diphenhydrAMINE (BENADRYL) 50 MG tablet Take 1 tablet (50 mg total) by mouth every 8 (eight) hours as needed for itching or sleep. 12/24/16   Jacquelin Hawking, NP  feeding supplement, ENSURE ENLIVE, (ENSURE ENLIVE) LIQD Take 237 mLs daily by mouth. 12/10/16   Loletha Grayer, MD  metoprolol tartrate (LOPRESSOR) 25 MG tablet Take 1 tablet (25 mg total) by mouth 4 (four) times daily as needed. Patient not taking: Reported on 01/05/2017 12/18/16   Lequita Asal, MD  mometasone-formoterol (DULERA) 100-5 MCG/ACT AERO Inhale 2 puffs 2 (two) times daily into the lungs. Patient not taking: Reported on 12/11/2016 12/11/16   Loletha Grayer, MD  ondansetron (ZOFRAN) 8 MG tablet Take 1 tablet every 8 (eight) hours as needed by mouth.    [provider]  predniSONE (DELTASONE) 20 MG tablet Take 2 tablets (40 mg total) daily with breakfast by mouth. Patient not taking: Reported on 01/05/2017 12/11/16   Loletha Grayer, MD  predniSONE (STERAPRED UNI-PAK 21 TAB) 10 MG (21) TBPK tablet Take as prescribed. Patient not taking: Reported on 01/05/2017 12/24/16   Jacquelin Hawking, NP   Physical Exam: Vitals:   01/05/17 0930 01/05/17 1000 01/05/17 1030 01/05/17 1100  BP: 117/77 107/75 109/76 113/70  Pulse: (!) 104 (!) 115 (!) 114 (!) 114  Resp: (!) 24 (!)  33 (!) 30   Temp:      TempSrc:      SpO2: 97% 90% 91% (!) 88%  Weight:      Height:         General:  WDWN, [place]/AT, in moderate distress  Eyes: PERRL, EOMI, no scleral icterus, conjunctiva clear  ENT: moist oropharynx without exudate, TM's benign, dentition good  Neck: supple, no lymphadenopathy. No bruits or thyromegaly  Cardiovascular: rapid rate with regular rhythm without MRG; 2+ peripheral pulses, no JVD, no peripheral edema  Respiratory: diffuse rhonchi and wheezing with dullness at the bases. No rales. Respiratory effort increased  Abdomen: soft, non tender to palpation, positive bowel sounds, no guarding, no rebound  Skin: no rashes or lesions  Musculoskeletal: normal bulk and tone, no joint swelling  Psychiatric: normal mood and affect, A&OX3  Neurologic: CN 2-12 grossly intact, Motor strength 5/5 in all 4 groups with symmetric DTR's and non-focal sensory exam  Labs on Admission:  Basic Metabolic Panel: Recent Labs  Lab 01/05/17 0929  NA 137  K 3.5  CL 101  CO2 24  GLUCOSE 153*  BUN 16  CREATININE 0.99  CALCIUM 9.0   Liver Function  Tests: No results for input(s): AST, ALT, ALKPHOS, BILITOT, PROT, ALBUMIN in the last 168 hours. No results for input(s): LIPASE, AMYLASE in the last 168 hours. No results for input(s): AMMONIA in the last 168 hours. CBC: Recent Labs  Lab 01/05/17 0929  WBC 21.9*  NEUTROABS 19.6*  HGB 11.7*  HCT 35.9  MCV 95.6  PLT 395   Cardiac Enzymes: Recent Labs  Lab 01/05/17 0929  TROPONINI <0.03    BNP (last 3 results) Recent Labs    12/07/16 1528  BNP 181.0*    ProBNP (last 3 results) No results for input(s): PROBNP in the last 8760 hours.  CBG: No results for input(s): GLUCAP in the last 168 hours.  Radiological Exams on Admission: Dg Chest Port 1 View  Result Date: 01/05/2017 CLINICAL DATA:  Short of breath, congestion.  Lung carcinoma EXAM: PORTABLE CHEST 1 VIEW COMPARISON:  Radiograph 12/18/2016, chest  CT 12/07/2016 FINDINGS: Power port in the RIGHT chest. Stable cardiac silhouette. There is dense airspace disease throughout the LEFT lung and involving the RIGHT lower lobe not changed from comparison exam. Some improvement in airspace disease in the RIGHT upper lobe. No pneumothorax. IMPRESSION: Persistent dense bilateral airspace disease. Electronically Signed   By: Suzy Bouchard M.D.   On: 01/05/2017 10:31    EKG: Independently reviewed.  Assessment/Plan Principal Problem:   Acute respiratory failure with hypoxia (HCC) Active Problems:   Malignant neoplasm of lung (HCC)   HCAP (healthcare-associated pneumonia)   SIRS (systemic inflammatory response syndrome) ([employer])   Will admit to floor with IV fluids and IV ABX. Cultures sent. Begin IV steroids and SVN's. Follow sugars. Consult Pulmonology and Oncology. Pt is FULL CODE. Will also consult PT and CSW  Diet: clear liquids Fluids: NS with K+@75  DVT Prophylaxis: SQ Heparin  Code Status: FULL  Family Communication: yes  Disposition Plan: home  Time spent: 50 min

## 2017-01-05 NOTE — Consult Note (Signed)
[place] CONSULT NOTE  Patient Care Team: Anthonette Legato, MD as PCP - General (Internal Medicine) Nestor Lewandowsky, MD as Referring Physician (Cardiothoracic Surgery) Flora Lipps, MD as Consulting Physician (Pulmonary Disease) [name], MD as Consulting Physician (Pulmonary Disease) Lissa Morales, MD as Referring Physician (Internal Medicine)  CHIEF COMPLAINTS/PURPOSE OF CONSULTATION:  Metastatic lung cancer  HISTORY OF PRESENTING ILLNESS:  Christina Sharp 71 y.o.  female with a history of metastatic adenocarcinoma status [without any targetable mutations] post multiple lines of therapy [follows with Dr. Guerry Minors including clinical trial at [facility]; most recently has been on gemcitabine single agent.  This was again discontinued in early November 2018 given the progression of disease.  Patient is currently being in the process of being reevaluated for a clinical trial at [facility].   Patient had been in the hospital approximately a month ago for "pneumonia".  Patient states that she had been having symptoms similar to previous "pneumonia".  Increasing shortness of breath cough-no hemoptysis.  Patient baseline is  on 2.5 L of oxygen.   Poor appetite.  No nausea no vomiting.  Denies any chest pain.  No headaches.   Chest x-ray in the emergency room showed persistent bilateral opacities; patient's white count was elevated at 21,000.  Patient is currently on 5 L of oxygen.  She also received breathing treatments; which seems to have made her feel better.  ROS: A complete 10 point review of system is done which is negative except mentioned above in history of present illness  MEDICAL HISTORY:  Past Medical History:  Diagnosis Date  . Cancer ([place])    basal cell   . Emphysema of lung ([place])   . Hypercholesteremia   . Hypertension   . Lung mass   . Renal disorder    renal insufficiency    SURGICAL HISTORY: Past Surgical History:  Procedure Laterality Date   . BASAL CELL CARCINOMA EXCISION    . COLONOSCOPY    . ENDOBRONCHIAL ULTRASOUND N/A 11/22/2015   Procedure: ENDOBRONCHIAL ULTRASOUND;  Surgeon: Flora Lipps, MD;  Location: [facility] ORS;  Service: Cardiopulmonary;  Laterality: N/A;  . FLEXIBLE BRONCHOSCOPY N/A 09/22/2015   Procedure: FLEXIBLE BRONCHOSCOPY;  Surgeon: [name], MD;  Location: [facility] ORS;  Service: Pulmonary;  Laterality: N/A;  . PORTACATH PLACEMENT Right 02/01/2016   Procedure: INSERTION PORT-A-CATH;  Surgeon: Nestor Lewandowsky, MD;  Location: [facility] ORS;  Service: General;  Laterality: Right;  Right internal jugular vein  . VIDEO ASSISTED THORACOSCOPY (VATS)/THOROCOTOMY Right 12/14/2015   Procedure: VIDEO ASSISTED THORACOSCOPY (VATS)/THOROCOTOMY;  Surgeon: Nestor Lewandowsky, MD;  Location: [facility] ORS;  Service: Thoracic;  Laterality: Right;    SOCIAL HISTORY: Social History   Socioeconomic History  . Marital status: Single    Spouse name: Not on file  . Number of children: Not on file  . Years of education: Not on file  . Highest education level: Not on file  Social Needs  . Financial resource strain: Not hard at all  . Food insecurity - worry: Never true  . Food insecurity - inability: Never true  . Transportation needs - medical: No  . Transportation needs - non-medical: No  Occupational History  . Not on file  Tobacco Use  . Smoking status: Former Smoker    Packs/day: 0.50    Years: 45.00    Pack years: 22.50    Types: Cigarettes    Last attempt to quit: 11/06/2015    Years since quitting: 1.1  . Smokeless tobacco: Never  Used  . Tobacco comment: hasn't smoked since 11-06-15  Substance and Sexual Activity  . Alcohol use: Yes    Comment: rare  . Drug use: No  . Sexual activity: Not on file  Other Topics Concern  . Not on file  Social History Narrative  . Not on file    FAMILY HISTORY: Family History  Problem Relation Age of Onset  . Alzheimer's disease Mother   . Heart disease Father   . Heart attack Brother      ALLERGIES:  is allergic to augmentin [amoxicillin-pot clavulanate] and penicillins.  MEDICATIONS:  Current Facility-Administered Medications  Medication Dose Route Frequency Provider Last Rate Last Dose  . 0.9 % NaCl with KCl 20 mEq/ L  infusion   Intravenous Continuous Idelle Crouch, MD 75 mL/hr at 01/05/17 1412    . acetaminophen (TYLENOL) tablet 650 mg  650 mg Oral Q6H PRN Idelle Crouch, MD       Or  . acetaminophen (TYLENOL) suppository 650 mg  650 mg Rectal Q6H PRN Idelle Crouch, MD      . albuterol (PROVENTIL) (2.5 MG/3ML) 0.083% nebulizer solution 2.5 mg  2.5 mg Nebulization Q6H PRN Idelle Crouch, MD      . Derrill Memo ON 01/06/2017] amLODipine (NORVASC) tablet 10 mg  10 mg Oral Daily Idelle Crouch, MD      . atorvastatin (LIPITOR) tablet 20 mg  20 mg Oral q1800 Idelle Crouch, MD      . benzonatate (TESSALON) capsule 200 mg  200 mg Oral TID PRN Idelle Crouch, MD      . bisacodyl (DULCOLAX) suppository 10 mg  10 mg Rectal Daily PRN Idelle Crouch, MD      . ceFEPIme (MAXIPIME) 1 g in dextrose 5 % 50 mL IVPB  1 g Intravenous Once Coffee, Garrett, RPH 100 mL/hr at 01/05/17 1553 1 g at 01/05/17 1553  . ceFEPIme (MAXIPIME) 2 g in dextrose 5 % 50 mL IVPB  2 g Intravenous Q12H Coffee, Donna Christen, [facility]      . diphenhydrAMINE (BENADRYL) capsule 50 mg  50 mg Oral Q8H PRN Idelle Crouch, MD      . docusate sodium (COLACE) capsule 100 mg  100 mg Oral BID Idelle Crouch, MD   100 mg at 01/05/17 1438  . feeding supplement (ENSURE ENLIVE) (ENSURE ENLIVE) liquid 237 mL  237 mL Oral Q24H Idelle Crouch, MD   237 mL at 01/05/17 1444  . heparin injection 5,000 Units  5,000 Units Subcutaneous Q8H Idelle Crouch, MD   5,000 Units at 01/05/17 1438  . hydrALAZINE (APRESOLINE) tablet 50 mg  50 mg Oral TID Idelle Crouch, MD   50 mg at 01/05/17 1438  . insulin aspart (novoLOG) injection 0-9 Units  0-9 Units Subcutaneous TID WC Idelle Crouch, MD      .  ipratropium-albuterol (DUONEB) 0.5-2.5 (3) MG/3ML nebulizer solution 3 mL  3 mL Nebulization QID Idelle Crouch, MD   3 mL at 01/05/17 1503  . magnesium oxide (MAG-OX) tablet 400 mg  400 mg Oral Daily Idelle Crouch, MD   400 mg at 01/05/17 1438  . methylPREDNISolone sodium succinate (SOLU-MEDROL) 125 mg/2 mL injection 60 mg  60 mg Intravenous Q6H Idelle Crouch, MD      . Derrill Memo ON 01/06/2017] metoprolol succinate (TOPROL-XL) 24 hr tablet 100 mg  100 mg Oral Daily Idelle Crouch, MD      . mometasone-formoterol [facility]) 100-5  MCG/ACT inhaler 2 puff  2 puff Inhalation BID Idelle Crouch, MD      . ondansetron [facility]) tablet 4 mg  4 mg Oral Q6H PRN Idelle Crouch, MD       Or  . ondansetron (ZOFRAN) injection 4 mg  4 mg Intravenous Q6H PRN Idelle Crouch, MD      . pantoprazole (PROTONIX) injection 40 mg  40 mg Intravenous Q12H Idelle Crouch, MD   40 mg at 01/05/17 1437  . [START ON 01/06/2017] pneumococcal 23 valent vaccine (PNU-IMMUNE) injection 0.5 mL  0.5 mL Intramuscular Tomorrow-1000 Fulton Reek D, MD      . potassium chloride (K-DUR) CR tablet 10 mEq  10 mEq Oral BID Idelle Crouch, MD   10 mEq at 01/05/17 1439  . sodium bicarbonate tablet 1,300 mg  1,300 mg Oral Daily Idelle Crouch, MD   1,300 mg at 01/05/17 1438  . vancomycin (VANCOCIN) IVPB 1000 mg/200 mL premix  1,000 mg Intravenous Q24H Coffee, Donna Christen, RPH       Facility-Administered Medications Ordered in Other Encounters  Medication Dose Route Frequency Provider Last Rate Last Dose  . heparin lock flush 100 unit/mL  500 Units Intravenous Once Nolon Stalls C, MD          .  PHYSICAL EXAMINATION:  Vitals:   01/05/17 1329 01/05/17 1504  BP: 113/71   Pulse: (!) 116   Resp: 18   Temp: (!) 97.5 F (36.4 C)   SpO2: 92% 92%   Filed Weights   01/05/17 0851  Weight: 137 lb (62.1 kg)    GENERAL: Well-nourished well-developed; Alert, no distress and comfortable.   Multiple family  members.  On nasal cannula oxygen. EYES: no pallor or icterus OROPHARYNX: no thrush or ulceration. NECK: supple, no masses felt LYMPH:  no palpable lymphadenopathy in the cervical, axillary or inguinal regions LUNGS: decreased breath sounds to auscultation at bases and positive for wheezing no crackles.   HEART/CVS: regular rate & rhythm and no murmurs; No lower extremity edema ABDOMEN: abdomen soft, non-tender and normal bowel sounds Musculoskeletal:no cyanosis of digits and no clubbing  PSYCH: alert & oriented x 3 with fluent speech NEURO: no focal motor/sensory deficits SKIN:  no rashes or significant lesions  LABORATORY DATA:  I have reviewed the data as listed Lab Results  Component Value Date   WBC 21.9 (H) 01/05/2017   HGB 11.7 (L) 01/05/2017   HCT 35.9 01/05/2017   MCV 95.6 01/05/2017   PLT 395 01/05/2017   Recent Labs    12/07/16 1528 12/18/16 0840 12/24/16 0838 01/05/17 0929  NA 137 136 134* 137  K 4.0 3.8 3.9 3.5  CL 99* 104 100* 101  CO2 24 22 22 24   GLUCOSE 121* 115* 183* 153*  BUN 22* 13 16 16   CREATININE 1.17* 1.04* 1.10* 0.99  CALCIUM 8.6* 8.9 9.1 9.0  GFRNONAA 45* 52* 49* 56*  GFRAA 53* >60 57* >60  PROT 6.8 7.0 7.3  --   ALBUMIN 2.8* 3.3* 3.6  --   AST 33 23 22  --   ALT 24 29 15   --   ALKPHOS 127* 123 99  --   BILITOT 1.0 0.7 1.1  --     RADIOGRAPHIC STUDIES: I have personally reviewed the radiological images as listed and agreed with the findings in the report. Dg Chest 2 View  Result Date: 12/18/2016 CLINICAL DATA:  History of lung carcinoma, pneumonia, followup EXAM: CHEST  2 VIEW COMPARISON:  CT chest of 12/07/2016 and chest x-ray of the same day FINDINGS: There is little interval change to very slight worsening of patchy airspace disease bilaterally left left-greater-than-right most consistent with multifocal pneumonia. Small right effusion cannot be excluded. Mediastinal hilar contours are unchanged and heart size is within normal limits.  Right-sided Port-A-Cath is present with tip overlying the lower SVC. No acute bony abnormality is seen. IMPRESSION: 1. Little change to slight worsening of patchy airspace disease bilaterally most consistent with multifocal pneumonia. 2. Port-A-Cath tip overlies the lower SVC. Electronically Signed   By: Ivar Drape M.D.   On: 12/18/2016 15:07   Dg Chest 2 View  Result Date: 12/07/2016 CLINICAL DATA:  Short of breath.  Lung cancer EXAM: CHEST  2 VIEW COMPARISON:  CT chest 07/24/2016, 02/09/2016. Chest x-ray 02/01/2016. PET 04/24/2016 FINDINGS: Progression of extensive bilateral lung opacities. The air bronchograms associated with these opacities bilaterally. No effusion or heart failure. Port-A-Cath tip in the SVC at the [facility] /RA junction. IMPRESSION: Extensive bilateral airspace disease. This has progressed from prior studies. In this patient with lung cancer, this may represent progression of metastatic disease. Pneumonia is possible particularly in the left upper lobe. Electronically Signed   By: Franchot Gallo M.D.   On: 12/07/2016 15:23   Ct Chest W Contrast  Result Date: 12/07/2016 CLINICAL DATA:  Followup lung cancer. EXAM: CT CHEST WITH CONTRAST TECHNIQUE: Multidetector CT imaging of the chest was performed during intravenous contrast administration. CONTRAST:  53mL ISOVUE-300 IOPAMIDOL (ISOVUE-300) INJECTION 61% COMPARISON:  07/24/2016 FINDINGS: Cardiovascular: The heart size appears normal. Aortic atherosclerosis identified. Calcification within the RCA, LAD and left circumflex coronary artery is identified. Mediastinum/Nodes: The trachea appears patent and is midline. Normal appearance of the esophagus. Mediastinal adenopathy is identified. Index left-sided pre-vascular node measures 1.3 cm, image 49 of series 2. Previously 0.8 cm. Index right paratracheal lymph node Measures 1.4 cm, image 36 of series 2. Previously 1.2 cm. Lower right paratracheal lymph node measures 1.4 cm, image 43 of series 2.  Previously 1.0 cm. 1.6 cm sub- carinal lymph node is unchanged. Lungs/Pleura: Advanced smoking related changes are identified throughout both lungs. There is no pleural effusion. Interval progression of bilateral confluent areas of masslike architectural distortion compatible with multifocal pulmonary adenocarcinoma. Significant worsening aeration to the left upper lobe. Index mass within the lingula measures 6.3 by 4.6 cm, image 78 of series 3. Previously this measured 1.8 by 3.6 cm. Left lower lobe peripheral subpleural mass measures 5.7 cm 5.7 x 3.6 cm, image 94 of series 3. Previously 1.7 x 2.0 cm. Posterior left lower lobe the lesion measures 3.2 by 4.0 cm, image 93 of series 3. Previously 0.8 by 0.8 cm. Large area of masslike consolidation and ground-glass attenuation in the left upper lobe Measures 6.2 x 12.8 cm. Previously index nodule in this area measured 1.1 x 1.1 cm. Index lesion within the posterior and lateral left upper lobe has solid, cystic and ground-glass components measuring 9.4 x 5.8 cm, image 58 of series 3. Previous 5.9 by 4.0 cm. Upper Abdomen: No acute abnormality. Musculoskeletal: No chest wall abnormality. No acute or significant osseous findings. IMPRESSION: 1. There is been significant interval worsening aeration of both lungs. 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 Findings are also compatible with progression of disease with superimposed multifocal bilateral infection. 2. Progression of mediastinal adenopathy. 3. Aortic Atherosclerosis (ICD10-I70.0) and Emphysema (ICD10-J43.9). 4. Multi vessel coronary artery calcifications. Electronically Signed   By: Kerby Moors  M.D.   On: 12/07/2016 16:29   Dg Chest Port 1 View  Result Date: 01/05/2017 CLINICAL DATA:  Short of breath, congestion.  Lung carcinoma EXAM: PORTABLE CHEST 1 VIEW COMPARISON:  Radiograph 12/18/2016, chest CT 12/07/2016 FINDINGS: Power port in the RIGHT chest.  Stable cardiac silhouette. There is dense airspace disease throughout the LEFT lung and involving the RIGHT lower lobe not changed from comparison exam. Some improvement in airspace disease in the RIGHT upper lobe. No pneumothorax. IMPRESSION: Persistent dense bilateral airspace disease. Electronically Signed   By: Suzy Bouchard M.D.   On: 01/05/2017 10:31   Ct Outside Films Chest  Result Date: 12/12/2016 This examination belongs to an outside facility and is stored here for comparison purposes only.  Contact the originating outside institution for any associated report or interpretation.  Ct Outside Films Body  Result Date: 12/12/2016 This examination belongs to an outside facility and is stored here for comparison purposes only.  Contact the originating outside institution for any associated report or interpretation.   ASSESSMENT & PLAN:   # 71 year old female patient with adenocarcinoma metastatic is currently admitted to the hospital for worsening shortness of breath/worsening cough.   #  Metastatic adenocarcinoma-most recently on gemcitabine approximately 1 month ago; progression noted on CT scan November 2018. Given the progression of multiple previous lines of therapies-including immunotherapy-unfortunately not many options available. She is currently awaiting clinical trial option at [facility].   # Worsening respiratory status-underlying progressive lung malignancy; likely superimposed pneumonia. Question COPD. Agree with antibiotics-vancomycin plus cefepime.  Also on steroids.  # Thank you Dr.Sparks for allowing me to participate in the care of your pleasant patient. Please do not hesitate to contact me with questions or concerns in the interim.  All questions were answered. The patient knows to call the clinic with any problems, questions or concerns.    Cammie Sickle, MD 01/05/2017 4:02 PM

## 2017-01-05 NOTE — Consult Note (Signed)
[facility] Consultation    ASSESSMENT/PLAN   Patient with Lepidic adenocarcinoma, diagnosed with lung biopsy VATS. Most of the alveolar changes on chest x-ray are chronic and consistent with respiratory neoplasm. Certainly clinical symptomatology consistent with superimposed pneumonia. Agree with broad-spectrum coverage, albuterol, Atrovent, Solu-Medrol. Would consult oncology Dr. Mike Gip has been following patient. No additional recommendations at this time, patient has been seen by [facility] and may follow up with him upon discharge. Patient is being evaluated for further clinical trials regarding treatment of adenocarcinoma.   Name: Christina Sharp MRN: [id number] DOB: 1945-12-09    ADMISSION DATE:  01/05/2017 CONSULTATION DATE:  01/05/2017  REFERRING MD :  Dr. 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 Has received different chemotherapeutic regimens based on cytogenetics to include carboplatin, pemetrexed, keytruda. Has also received taxotere, and has been on the [place] trial which was discontinued secondary to disease progression.. She developed pneumonia last month, states that it never went away and presents with worsening increasing shortness of breath, cough, congestion, sputum production for approximately 2 days. Upon arrival at emergency department she was noted to be hypoxemic. She usually uses oxygen 2-1/2 L and now is presently on 6 L nasal cannula. She states that the sputum she coughs up is generally clear with occasional yellow discoloration. She reports penicillin allergy  PAST MEDICAL HISTORY :  Past Medical History:  Diagnosis Date  . Cancer ([place])    basal  cell   . Emphysema of lung ([place])   . Hypercholesteremia   . Hypertension   . Lung mass   . Renal disorder    renal insufficiency   Past Surgical History:  Procedure Laterality Date  . BASAL CELL CARCINOMA EXCISION    . COLONOSCOPY    . ENDOBRONCHIAL ULTRASOUND N/A 11/22/2015   Procedure: ENDOBRONCHIAL ULTRASOUND;  Surgeon: Flora Lipps, MD;  Location: [facility] ORS;  Service: Cardiopulmonary;  Laterality: N/A;  . FLEXIBLE BRONCHOSCOPY N/A 09/22/2015   Procedure: FLEXIBLE BRONCHOSCOPY;  Surgeon: [name], MD;  Location: [facility] ORS;  Service: Pulmonary;  Laterality: N/A;  . PORTACATH PLACEMENT Right 02/01/2016   Procedure: INSERTION PORT-A-CATH;  Surgeon: Nestor Lewandowsky, MD;  Location: [facility] ORS;  Service: General;  Laterality: Right;  Right internal jugular vein  . VIDEO ASSISTED THORACOSCOPY (VATS)/THOROCOTOMY Right 12/14/2015   Procedure: VIDEO ASSISTED THORACOSCOPY (VATS)/THOROCOTOMY;  Surgeon: Nestor Lewandowsky, MD;  Location: [facility] ORS;  Service: Thoracic;  Laterality: Right;   Prior to Admission medications   Medication Sig Start Date End Date Taking? Authorizing Provider  amLODipine (NORVASC) 10 MG tablet Take 10 mg by mouth daily.   Yes [provider]  atorvastatin (LIPITOR) 20 MG tablet Take 20 mg daily by mouth. 11/25/16  Yes [provider]  hydrALAZINE (APRESOLINE) 50 MG tablet Take 50 mg by mouth 3 (three) times daily.   Yes [provider]  KLOR-CON 10 10 MEQ tablet TAKE 1 TABLET (10 MEQ TOTAL) BY MOUTH 2 (TWO) TIMES DAILY. 08/13/16  Yes Corcoran, Melissa C, MD  magnesium oxide (MAG-OX) 400 MG tablet TAKE 1 TABLET (400 MG TOTAL) BY MOUTH DAILY. 08/02/16  Yes Lloyd Huger, MD  metoprolol succinate (TOPROL-XL) 100 MG 24 hr tablet Take 100 mg by mouth daily. Take with or immediately following a meal.  Yes [provider]  sodium bicarbonate 650 MG tablet Take 1,300 mg by mouth daily.    Yes [provider]  albuterol (PROVENTIL  HFA;VENTOLIN HFA) 108 (90 Base) MCG/ACT inhaler Inhale 2 puffs every 6 (six) hours as needed into the lungs for wheezing or shortness of breath. 12/10/16   Loletha Grayer, MD  albuterol (PROVENTIL) (2.5 MG/3ML) 0.083% nebulizer solution Take 3 mLs (2.5 mg total) every 4 (four) hours as needed by nebulization for wheezing or shortness of breath. 12/10/16   Loletha Grayer, MD  benzonatate (TESSALON) 200 MG capsule Take 1 capsule (200 mg total) by mouth 3 (three) times daily as needed for cough. 12/27/16   Lequita Asal, MD  diphenhydrAMINE (BENADRYL) 50 MG tablet Take 1 tablet (50 mg total) by mouth every 8 (eight) hours as needed for itching or sleep. 12/24/16   Jacquelin Hawking, NP  feeding supplement, ENSURE ENLIVE, (ENSURE ENLIVE) LIQD Take 237 mLs daily by mouth. 12/10/16   Loletha Grayer, MD  metoprolol tartrate (LOPRESSOR) 25 MG tablet Take 1 tablet (25 mg total) by mouth 4 (four) times daily as needed. Patient not taking: Reported on 01/05/2017 12/18/16   Lequita Asal, MD  mometasone-formoterol (DULERA) 100-5 MCG/ACT AERO Inhale 2 puffs 2 (two) times daily into the lungs. Patient not taking: Reported on 12/11/2016 12/11/16   Loletha Grayer, MD  ondansetron (ZOFRAN) 8 MG tablet Take 1 tablet every 8 (eight) hours as needed by mouth.    [provider]  predniSONE (DELTASONE) 20 MG tablet Take 2 tablets (40 mg total) daily with breakfast by mouth. Patient not taking: Reported on 01/05/2017 12/11/16   Loletha Grayer, MD  predniSONE (STERAPRED UNI-PAK 21 TAB) 10 MG (21) TBPK tablet Take as prescribed. Patient not taking: Reported on 01/05/2017 12/24/16   Jacquelin Hawking, NP   Allergies  Allergen Reactions  . Augmentin [Amoxicillin-Pot Clavulanate] Hives  . Penicillins Hives    Has patient had a PCN reaction causing immediate rash, facial/tongue/throat swelling, SOB or lightheadedness with hypotension: No Has patient had a PCN reaction causing severe rash  involving mucus membranes or skin necrosis: No Has patient had a PCN reaction that required hospitalization No Has patient had a PCN reaction occurring within the last 10 years: Yes If all of the above answers are "NO", then may proceed with Cephalosporin use.      FAMILY HISTORY:  Family History  Problem Relation Age of Onset  . Alzheimer's disease Mother   . Heart disease Father   . Heart attack Brother    SOCIAL HISTORY:  reports that she quit smoking about 14 months ago. Her smoking use included cigarettes. She has a 22.50 pack-year smoking history. she has never used smokeless tobacco. She reports that she drinks alcohol. She reports that she does not use drugs.  REVIEW OF SYSTEMS:   The remainder of systems were reviewed and were found to be negative other than what is documented in the HPI.    VITAL SIGNS: Temp:  [97.3 F (36.3 C)-97.5 F (36.4 C)] 97.5 F (36.4 C) (12/08 1329) Pulse Rate:  [97-126] 116 (12/08 1329) Resp:  [18-33] 18 (12/08 1329) BP: (107-130)/(69-79) 113/71 (12/08 1329) SpO2:  [88 %-97 %] 92 % (12/08 1329) Weight:  [62.1 kg (137 lb)] 62.1 kg (137 lb) (12/08 0851) HEMODYNAMICS:   INTAKE / OUTPUT:  Intake/Output Summary (Last 24 hours) at 01/05/2017 1451 Last data filed at 01/05/2017 1150 Gross per 24 hour  Intake 50 ml  Output -  Net 50 ml    Physical Examination:   VS: BP 113/71 (BP Location: Left Arm)   Pulse (!) 116   Temp (!) 97.5 F (36.4 C) (Oral)   Resp 18   Ht 5\' 3"  (1.6 m)   Wt 62.1 kg (137 lb)   SpO2 92%   BMI 24.27 kg/m   General Appearance: No distress  Neuro:without focal findings, mental status, speech normal,. HEENT: PERRLA, EOM intact, no ptosis, no other lesions noticed;  Pulmonary: Coarse rhonchi and crackles appreciated diffusely throughout both lungs CardiovascularNormal S1,S2.  No m/r/g.    Abdomen: Benign, Soft, non-tender, No masses, hepatosplenomegaly, No lymphadenopathy Endoc: No evident thyromegaly, no signs  of acromegaly. Skin:   warm, no rashes, no ecchymosis  Extremities: normal, no cyanosis, clubbing, no edema, warm with normal capillary refill.    LABS: Reviewed   LABORATORY PANEL:   CBC Recent Labs  Lab 01/05/17 0929  WBC 21.9*  HGB 11.7*  HCT 35.9  PLT 395    Chemistries  Recent Labs  Lab 01/05/17 0929  NA 137  K 3.5  CL 101  CO2 24  GLUCOSE 153*  BUN 16  CREATININE 0.99  CALCIUM 9.0    Recent Labs  Lab 01/05/17 1256  GLUCAP 198*   No results for input(s): PHART, PCO2ART, PO2ART in the last 168 hours. No results for input(s): AST, ALT, ALKPHOS, BILITOT, ALBUMIN in the last 168 hours.  Cardiac Enzymes Recent Labs  Lab 01/05/17 0929  TROPONINI <0.03    RADIOLOGY:  Dg Chest Port 1 View  Result Date: 01/05/2017 CLINICAL DATA:  Short of breath, congestion.  Lung carcinoma EXAM: PORTABLE CHEST 1 VIEW COMPARISON:  Radiograph 12/18/2016, chest CT 12/07/2016 FINDINGS: Power port in the RIGHT chest. Stable cardiac silhouette. There is dense airspace disease throughout the LEFT lung and involving the RIGHT lower lobe not changed from comparison exam. Some improvement in airspace disease in the RIGHT upper lobe. No pneumothorax. IMPRESSION: Persistent dense bilateral airspace disease. Electronically Signed   By: Suzy Bouchard M.D.   On: 01/05/2017 10:31    Hermelinda Dellen, DO   01/05/2017, 2:51 PM

## 2017-01-05 NOTE — ED Triage Notes (Signed)
She arrives today from home with reports of increased congestion in her chest and shortness of breath with little exertion  Chronic o2 use at 2L  sats during triage 88% on 3L  Hospitalized one month ago with PNA questions "if it ever went away"

## 2017-01-06 ENCOUNTER — Inpatient Hospital Stay: Payer: Medicare Other

## 2017-01-06 LAB — CBC
HEMATOCRIT: 31.4 % — AB (ref 35.0–47.0)
Hemoglobin: 10.4 g/dL — ABNORMAL LOW (ref 12.0–16.0)
MCH: 32.1 pg (ref 26.0–34.0)
MCHC: 33.1 g/dL (ref 32.0–36.0)
MCV: 96.8 fL (ref 80.0–100.0)
Platelets: 361 10*3/uL (ref 150–440)
RBC: 3.24 MIL/uL — ABNORMAL LOW (ref 3.80–5.20)
RDW: 17.7 % — AB (ref 11.5–14.5)
WBC: 26.4 10*3/uL — AB (ref 3.6–11.0)

## 2017-01-06 LAB — COMPREHENSIVE METABOLIC PANEL
ALT: 11 U/L — AB (ref 14–54)
AST: 16 U/L (ref 15–41)
Albumin: 2.7 g/dL — ABNORMAL LOW (ref 3.5–5.0)
Alkaline Phosphatase: 82 U/L (ref 38–126)
Anion gap: 10 (ref 5–15)
BILIRUBIN TOTAL: 0.5 mg/dL (ref 0.3–1.2)
BUN: 14 mg/dL (ref 6–20)
CO2: 23 mmol/L (ref 22–32)
CREATININE: 0.83 mg/dL (ref 0.44–1.00)
Calcium: 8.8 mg/dL — ABNORMAL LOW (ref 8.9–10.3)
Chloride: 109 mmol/L (ref 101–111)
Glucose, Bld: 140 mg/dL — ABNORMAL HIGH (ref 65–99)
POTASSIUM: 3.8 mmol/L (ref 3.5–5.1)
Sodium: 142 mmol/L (ref 135–145)
TOTAL PROTEIN: 6.1 g/dL — AB (ref 6.5–8.1)

## 2017-01-06 LAB — MRSA PCR SCREENING: MRSA by PCR: NEGATIVE

## 2017-01-06 LAB — GLUCOSE, CAPILLARY
GLUCOSE-CAPILLARY: 164 mg/dL — AB (ref 65–99)
Glucose-Capillary: 98 mg/dL (ref 65–99)
Glucose-Capillary: 175 mg/dL — ABNORMAL HIGH (ref 65–99)
Glucose-Capillary: 158 mg/dL — ABNORMAL HIGH (ref 65–99)

## 2017-01-06 NOTE — Progress Notes (Signed)
[place] [facility] [place] NAME: Christina Sharp    MR#:  [id number]  DATE OF BIRTH:  1945/08/31  SUBJECTIVE:  CHIEF COMPLAINT:   Chief Complaint  Patient presents with  . Nasal Congestion  . Shortness of Breath     Recent pneumonia, now again SOB for 1 week. Found to have PNA. Feels same today as yesterday.  REVIEW OF SYSTEMS:  CONSTITUTIONAL: No fever, fatigue or weakness.  EYES: No blurred or double vision.  EARS, NOSE, AND THROAT: No tinnitus or ear pain.  RESPIRATORY: positive for cough, shortness of breath, no wheezing or hemoptysis.  CARDIOVASCULAR: No chest pain, orthopnea, edema.  GASTROINTESTINAL: No nausea, vomiting, diarrhea or abdominal pain.  GENITOURINARY: No dysuria, hematuria.  ENDOCRINE: No polyuria, nocturia,  HEMATOLOGY: No anemia, easy bruising or bleeding SKIN: No rash or lesion. MUSCULOSKELETAL: No joint pain or arthritis.   NEUROLOGIC: No tingling, numbness, weakness.  PSYCHIATRY: No anxiety or depression.   ROS  DRUG ALLERGIES:   Allergies  Allergen Reactions  . Augmentin [Amoxicillin-Pot Clavulanate] Hives  . Penicillins Hives    Has patient had a PCN reaction causing immediate rash, facial/tongue/throat swelling, SOB or lightheadedness with hypotension: No Has patient had a PCN reaction causing severe rash involving mucus membranes or skin necrosis: No Has patient had a PCN reaction that required hospitalization No Has patient had a PCN reaction occurring within the last 10 years: Yes If all of the above answers are "NO", then may proceed with Cephalosporin use.      VITALS:  Blood pressure 118/69, pulse 96, temperature (!) 97.5 F (36.4 C), resp. rate (!) 24, height 5\' 3"  (1.6 m), weight 64 kg (141 lb 2 oz), SpO2 92 %.  PHYSICAL EXAMINATION:  GENERAL:  71 y.o.-year-old patient lying in the bed with no acute distress.  EYES: Pupils equal, round, reactive to light and accommodation. No scleral icterus.  Extraocular muscles intact.  HEENT: Head atraumatic, normocephalic. Oropharynx and nasopharynx clear.  NECK:  Supple, no jugular venous distention. No thyroid enlargement, no tenderness.  LUNGS: Normal breath sounds bilaterally, no wheezing, have crepitation. No use of accessory muscles of respiration. On 6 ltr oxygen via nasal canula. CARDIOVASCULAR: S1, S2 normal. No murmurs, rubs, or gallops.  ABDOMEN: Soft, nontender, nondistended. Bowel sounds present. No organomegaly or mass.  EXTREMITIES: No pedal edema, cyanosis, or clubbing.  NEUROLOGIC: Cranial nerves II through XII are intact. Muscle strength 5/5 in all extremities. Sensation intact. Gait not checked.  PSYCHIATRIC: The patient is alert and oriented x 3.  SKIN: No obvious rash, lesion, or ulcer.   Physical Exam LABORATORY PANEL:   CBC Recent Labs  Lab 01/06/17 0509  WBC 26.4*  HGB 10.4*  HCT 31.4*  PLT 361   ------------------------------------------------------------------------------------------------------------------  Chemistries  Recent Labs  Lab 01/06/17 0509  NA 142  K 3.8  CL 109  CO2 23  GLUCOSE 140*  BUN 14  CREATININE 0.83  CALCIUM 8.8*  AST 16  ALT 11*  ALKPHOS 82  BILITOT 0.5   ------------------------------------------------------------------------------------------------------------------  Cardiac Enzymes Recent Labs  Lab 01/05/17 0929  TROPONINI <0.03   ------------------------------------------------------------------------------------------------------------------  RADIOLOGY:  X-ray Chest Pa And Lateral  Result Date: 01/06/2017 CLINICAL DATA:  Followup multifocal pneumonia. EXAM: CHEST  2 VIEW COMPARISON:  01/05/2017 FINDINGS: Interstitial opacities and airspace consolidation is without significant change from prior study. This is most extensive on the left. No new lung abnormalities. No pneumothorax. IMPRESSION: 1. No significant change in the bilateral, left  greater than right,  interstitial and airspace opacities. No new abnormalities. Electronically Signed   By: Lajean Manes M.D.   On: 01/06/2017 07:54   Dg Chest Port 1 View  Result Date: 01/05/2017 CLINICAL DATA:  Short of breath, congestion.  Lung carcinoma EXAM: PORTABLE CHEST 1 VIEW COMPARISON:  Radiograph 12/18/2016, chest CT 12/07/2016 FINDINGS: Power port in the RIGHT chest. Stable cardiac silhouette. There is dense airspace disease throughout the LEFT lung and involving the RIGHT lower lobe not changed from comparison exam. Some improvement in airspace disease in the RIGHT upper lobe. No pneumothorax. IMPRESSION: Persistent dense bilateral airspace disease. Electronically Signed   By: Suzy Bouchard M.D.   On: 01/05/2017 10:31    ASSESSMENT AND PLAN:   Principal Problem:   Acute respiratory failure with hypoxia (HCC) Active Problems:   Malignant neoplasm of lung ([facility])   HCAP (healthcare-associated pneumonia)   SIRS (systemic inflammatory response syndrome) ([facility]CC)  * Ac on ch respi failure    HCAP   Was on 2 ltr oxygen for last one month, now on 6 ltr- try to taper as tolerated.   On Vanc+ cefepime for HCAP   Cx sent, check for MRSA PCR.   Appreciated Pulm help.  * Emphysema of lung   Dulera, Duoneb and solumedrol  * metastatic adenocarcinoma   Followed by oncology       All the records are reviewed and case discussed with Care Management/Social Workerr. Management plans discussed with the patient, family and they are in agreement.  CODE STATUS: Full.  TOTAL TIME TAKING CARE OF THIS PATIENT: 35 minutes.     POSSIBLE D/C IN 1-2 DAYS, DEPENDING ON CLINICAL CONDITION.   Vaughan Basta M.D on 01/06/2017   Between 7am to 6pm - Pager - [phone]  After 6pm go to [email or web address] - password [email or web address] [facility]  Office  [phone]  CC: Primary care physician; Anthonette Legato, MD  Note: This dictation was prepared with Dragon dictation along with smaller  phrase technology. Any transcriptional errors that result from this process are unintentional.

## 2017-01-06 NOTE — Plan of Care (Signed)
  Progressing Education: Knowledge of General Education information will improve 01/06/2017 1424 - Progressing by Daylene Posey, RN Health Behavior/Discharge Planning: Ability to manage health-related needs will improve 01/06/2017 1424 - Progressing by Daylene Posey, RN Clinical Measurements: Ability to maintain clinical measurements within normal limits will improve 01/06/2017 1424 - Progressing by Daylene Posey, RN Will remain free from infection 01/06/2017 1424 - Progressing by Daylene Posey, RN Diagnostic test results will improve 01/06/2017 1424 - Progressing by Daylene Posey, RN Respiratory complications will improve 01/06/2017 1424 - Progressing by Daylene Posey, RN Cardiovascular complication will be avoided 01/06/2017 1424 - Progressing by Daylene Posey, RN Activity: Risk for activity intolerance will decrease 01/06/2017 1424 - Progressing by Daylene Posey, RN Nutrition: Adequate nutrition will be maintained 01/06/2017 1424 - Progressing by Daylene Posey, RN Coping: Level of anxiety will decrease 01/06/2017 1424 - Progressing by Daylene Posey, RN Elimination: Will not experience complications related to bowel motility 01/06/2017 1424 - Progressing by Daylene Posey, RN Will not experience complications related to urinary retention 01/06/2017 1424 - Progressing by Daylene Posey, RN Pain Managment: General experience of comfort will improve 01/06/2017 1424 - Progressing by Daylene Posey, RN Safety: Ability to remain free from injury will improve 01/06/2017 1424 - Progressing by Daylene Posey, RN Skin Integrity: Risk for impaired skin integrity will decrease 01/06/2017 1424 - Progressing by Daylene Posey, RN Activity: Ability to tolerate increased activity will improve 01/06/2017 1424 - Progressing by Daylene Posey, RN Clinical Measurements: Ability to maintain a body temperature in the normal range will improve 01/06/2017 1424 -  Progressing by Daylene Posey, RN Respiratory: Ability to maintain adequate ventilation will improve 01/06/2017 1424 - Progressing by Daylene Posey, RN Ability to maintain a clear airway will improve 01/06/2017 1424 - Progressing by Daylene Posey, RN

## 2017-01-07 LAB — BASIC METABOLIC PANEL
Anion gap: 9 (ref 5–15)
BUN: 26 mg/dL — ABNORMAL HIGH (ref 6–20)
CHLORIDE: 110 mmol/L (ref 101–111)
CO2: 22 mmol/L (ref 22–32)
CREATININE: 1.12 mg/dL — AB (ref 0.44–1.00)
Calcium: 8.4 mg/dL — ABNORMAL LOW (ref 8.9–10.3)
GFR calc non Af Amer: 48 mL/min — ABNORMAL LOW (ref >60)
GFR, EST AFRICAN AMERICAN: 55 mL/min — AB (ref >60)
Glucose, Bld: 159 mg/dL — ABNORMAL HIGH (ref 65–99)
POTASSIUM: 3.8 mmol/L (ref 3.5–5.1)
SODIUM: 141 mmol/L (ref 135–145)

## 2017-01-07 LAB — GLUCOSE, CAPILLARY
GLUCOSE-CAPILLARY: 154 mg/dL — AB (ref 65–99)
Glucose-Capillary: 119 mg/dL — ABNORMAL HIGH (ref 65–99)
Glucose-Capillary: 132 mg/dL — ABNORMAL HIGH (ref 65–99)
Glucose-Capillary: 186 mg/dL — ABNORMAL HIGH (ref 65–99)

## 2017-01-07 LAB — CBC
HEMATOCRIT: 31.9 % — AB (ref 35.0–47.0)
HEMOGLOBIN: 10 g/dL — AB (ref 12.0–16.0)
MCH: 30.1 pg (ref 26.0–34.0)
MCHC: 31.4 g/dL — AB (ref 32.0–36.0)
MCV: 95.9 fL (ref 80.0–100.0)
Platelets: 374 10*3/uL (ref 150–440)
RBC: 3.33 MIL/uL — AB (ref 3.80–5.20)
RDW: 17.9 % — ABNORMAL HIGH (ref 11.5–14.5)
WBC: 42.3 10*3/uL — ABNORMAL HIGH (ref 3.6–11.0)

## 2017-01-07 NOTE — Progress Notes (Signed)
[place] [facility] [place] NAME: Christina Sharp    MR#:  [id number]  DATE OF BIRTH:  Mar 23, 1945  SUBJECTIVE:  CHIEF COMPLAINT:   Chief Complaint  Patient presents with  . Nasal Congestion  . Shortness of Breath     Recent pneumonia, now again SOB for 1 week. Found to have PNA. Feels same today as yesterday. Still on 6 ltr oxygen.  REVIEW OF SYSTEMS:  CONSTITUTIONAL: No fever, fatigue or weakness.  EYES: No blurred or double vision.  EARS, NOSE, AND THROAT: No tinnitus or ear pain.  RESPIRATORY: positive for cough, shortness of breath, no wheezing or hemoptysis.  CARDIOVASCULAR: No chest pain, orthopnea, edema.  GASTROINTESTINAL: No nausea, vomiting, diarrhea or abdominal pain.  GENITOURINARY: No dysuria, hematuria.  ENDOCRINE: No polyuria, nocturia,  HEMATOLOGY: No anemia, easy bruising or bleeding SKIN: No rash or lesion. MUSCULOSKELETAL: No joint pain or arthritis.   NEUROLOGIC: No tingling, numbness, weakness.  PSYCHIATRY: No anxiety or depression.   ROS  DRUG ALLERGIES:   Allergies  Allergen Reactions  . Augmentin [Amoxicillin-Pot Clavulanate] Hives  . Penicillins Hives    Has patient had a PCN reaction causing immediate rash, facial/tongue/throat swelling, SOB or lightheadedness with hypotension: No Has patient had a PCN reaction causing severe rash involving mucus membranes or skin necrosis: No Has patient had a PCN reaction that required hospitalization No Has patient had a PCN reaction occurring within the last 10 years: Yes If all of the above answers are "NO", then may proceed with Cephalosporin use.      VITALS:  Blood pressure (!) 107/56, pulse (!) 108, temperature (!) 97.5 F (36.4 C), temperature source Oral, resp. rate 16, height 5\' 3"  (1.6 m), weight 64 kg (141 lb 2 oz), SpO2 93 %.  PHYSICAL EXAMINATION:  GENERAL:  71 y.o.-year-old patient lying in the bed with no acute distress.  EYES: Pupils equal, round,  reactive to light and accommodation. No scleral icterus. Extraocular muscles intact.  HEENT: Head atraumatic, normocephalic. Oropharynx and nasopharynx clear.  NECK:  Supple, no jugular venous distention. No thyroid enlargement, no tenderness.  LUNGS: Normal breath sounds bilaterally, no wheezing, have crepitation. No use of accessory muscles of respiration. On 6 ltr oxygen via nasal canula. CARDIOVASCULAR: S1, S2 normal. No murmurs, rubs, or gallops.  ABDOMEN: Soft, nontender, nondistended. Bowel sounds present. No organomegaly or mass.  EXTREMITIES: No pedal edema, cyanosis, or clubbing.  NEUROLOGIC: Cranial nerves II through XII are intact. Muscle strength 5/5 in all extremities. Sensation intact. Gait not checked.  PSYCHIATRIC: The patient is alert and oriented x 3.  SKIN: No obvious rash, lesion, or ulcer.   Physical Exam LABORATORY PANEL:   CBC Recent Labs  Lab 01/07/17 1024  WBC 42.3*  HGB 10.0*  HCT 31.9*  PLT 374   ------------------------------------------------------------------------------------------------------------------  Chemistries  Recent Labs  Lab 01/06/17 0509 01/07/17 1024  NA 142 141  K 3.8 3.8  CL 109 110  CO2 23 22  GLUCOSE 140* 159*  BUN 14 26*  CREATININE 0.83 1.12*  CALCIUM 8.8* 8.4*  AST 16  --   ALT 11*  --   ALKPHOS 82  --   BILITOT 0.5  --    ------------------------------------------------------------------------------------------------------------------  Cardiac Enzymes Recent Labs  Lab 01/05/17 0929  TROPONINI <0.03   ------------------------------------------------------------------------------------------------------------------  RADIOLOGY:  X-ray Chest Pa And Lateral  Result Date: 01/06/2017 CLINICAL DATA:  Followup multifocal pneumonia. EXAM: CHEST  2 VIEW COMPARISON:  01/05/2017 FINDINGS: Interstitial opacities and  airspace consolidation is without significant change from prior study. This is most extensive on the left.  No new lung abnormalities. No pneumothorax. IMPRESSION: 1. No significant change in the bilateral, left greater than right, interstitial and airspace opacities. No new abnormalities. Electronically Signed   By: Lajean Manes M.D.   On: 01/06/2017 07:54    ASSESSMENT AND PLAN:   Principal Problem:   Acute respiratory failure with hypoxia (HCC) Active Problems:   Malignant neoplasm of lung (HCC)   HCAP (healthcare-associated pneumonia)   SIRS (systemic inflammatory response syndrome) (HCC)  * Ac on ch respi failure    HCAP   Was on 2 ltr oxygen for last one month, now on 6 ltr- try to taper as tolerated.   On Vanc+ cefepime for HCAP   Cx sent, check for MRSA PCR- negative, stop vanc.   Appreciated Pulm help.  may do CT, if cont to be hypoxic tomorrow.  * Emphysema of lung   Dulera, Duoneb and solumedrol  * metastatic adenocarcinoma   Followed by oncology     All the records are reviewed and case discussed with Care Management/Social Workerr. Management plans discussed with the patient, family and they are in agreement.  CODE STATUS: Full.  TOTAL TIME TAKING CARE OF THIS PATIENT: 35 minutes.     POSSIBLE D/C IN 1-2 DAYS, DEPENDING ON CLINICAL CONDITION.   Vaughan Basta M.D on 01/07/2017   Between 7am to 6pm - Pager - [phone]  After 6pm go to [email or web address] - password [email or web address] [facility]  Office  [phone]  CC: Primary care physician; Anthonette Legato, MD  Note: This dictation was prepared with Dragon dictation along with smaller phrase technology. Any transcriptional errors that result from this process are unintentional.

## 2017-01-07 NOTE — Progress Notes (Deleted)
Called to room by patient stating she couldn't breathe. Upon assessment, pt's breathing was labored with stridor. Pt's 02 at time is 98% on room air. Pt's lung sounds are clear. Pt given breathing treatment and respiratory therapist and MD called. Awaiting ENT consult. Ammie Dalton, RN

## 2017-01-08 ENCOUNTER — Inpatient Hospital Stay: Payer: Medicare Other

## 2017-01-08 DIAGNOSIS — Z794 Long term (current) use of insulin: Secondary | ICD-10-CM

## 2017-01-08 DIAGNOSIS — C3432 Malignant neoplasm of lower lobe, left bronchus or lung: Secondary | ICD-10-CM

## 2017-01-08 DIAGNOSIS — C7800 Secondary malignant neoplasm of unspecified lung: Secondary | ICD-10-CM

## 2017-01-08 DIAGNOSIS — J9621 Acute and chronic respiratory failure with hypoxia: Secondary | ICD-10-CM

## 2017-01-08 LAB — GLUCOSE, CAPILLARY
GLUCOSE-CAPILLARY: 152 mg/dL — AB (ref 65–99)
GLUCOSE-CAPILLARY: 113 mg/dL — AB (ref 65–99)
Glucose-Capillary: 103 mg/dL — ABNORMAL HIGH (ref 65–99)
Glucose-Capillary: 176 mg/dL — ABNORMAL HIGH (ref 65–99)

## 2017-01-08 LAB — EXPECTORATED SPUTUM ASSESSMENT W REFEX TO RESP CULTURE

## 2017-01-08 LAB — EXPECTORATED SPUTUM ASSESSMENT W GRAM STAIN, RFLX TO RESP C

## 2017-01-08 MED ORDER — IOPAMIDOL (ISOVUE-300) INJECTION 61%
75.0000 mL | Freq: Once | INTRAVENOUS | Status: AC | PRN
  Administered 2017-01-08: 75 mL via INTRAVENOUS

## 2017-01-08 MED ORDER — FLUTICASONE PROPIONATE 50 MCG/ACT NA SUSP
1.0000 | Freq: Every day | NASAL | Status: DC
  Administered 2017-01-08 – 2017-01-13 (×4): 1 via NASAL
  Filled 2017-01-08: qty 16

## 2017-01-08 MED ORDER — METHYLPREDNISOLONE SODIUM SUCC 40 MG IJ SOLR
40.0000 mg | Freq: Two times a day (BID) | INTRAMUSCULAR | Status: DC
  Administered 2017-01-09 – 2017-01-11 (×5): 40 mg via INTRAVENOUS
  Filled 2017-01-08 (×5): qty 1

## 2017-01-08 NOTE — Progress Notes (Signed)
Physical Therapy Treatment Patient Details Name: Christina Sharp MRN: [id number] DOB: [birth year] Today's Date: 01/08/2017    History of Present Illness Patient is a 71 y.o. female admitted on 08 DEC with worsening SOB x1 week. PMH includes malignant neoplasm of lung, HCAP, SIRS, HTN, COPD, and CKD. Patient is on 2.5 L O2 at home.    PT Comments    Pt is making small progress towards goals secondary to SOB and limited endurance with exertion. Pt with sats decreasing to 78% with side stepping at EOB while on 6L of O2. Pt has strength and balance, does not require AD at this time, however limited functional mobility due to cardio-pulm status. Encouraged pt to take frequent rest breaks and to have chair around home and at top of stairs to limit exertion. Takes increased time for O2 sats to recover, eventually return to baseline of 88-89% on 6L of O2. Will continue to progress as able.   Follow Up Recommendations  Home health PT     Equipment Recommendations       Recommendations for Other Services       Precautions / Restrictions Precautions Precautions: None Restrictions Weight Bearing Restrictions: No    Mobility  Bed Mobility Overal bed mobility: Modified Independent             General bed mobility comments: able to sit at EOB with safe technique with ease of movement.  Transfers Overall transfer level: Modified independent Equipment used: None             General transfer comment: transfer with safe technique. At first, slightly unsteady, however able to stand with upright posture  Ambulation/Gait Ambulation/Gait assistance: Min guard Ambulation Distance (Feet): 12 Feet Assistive device: None Gait Pattern/deviations: Step-to pattern     General Gait Details: able to march in place, maintaining balance, also able to side step back and forth down the bed 4 times. Safe technique. Ambulation limited secondary to decreased O2 sats with limited  mobility.   Stairs            Wheelchair Mobility    Modified Rankin (Stroke Patients Only)       Balance                                            Cognition Arousal/Alertness: Awake/alert Behavior During Therapy: WFL for tasks assessed/performed Overall Cognitive Status: Within Functional Limits for tasks assessed                                        Exercises Other Exercises Other Exercises: Unable to perform ther-ex secondary to O2 sats, encouraged to continue cautiously when breathing improves    General Comments        Pertinent Vitals/Pain Pain Assessment: No/denies pain    Home Living                      Prior Function            PT Goals (current goals can now be found in the care plan section) Acute Rehab PT Goals Patient Stated Goal: To get stronger PT Goal Formulation: With patient Time For Goal Achievement: 01/19/17 Potential to Achieve Goals: Good Progress towards PT goals: Progressing toward goals    Frequency  Min 2X/week      PT Plan Current plan remains appropriate    Co-evaluation              AM-PAC PT "6 Clicks" Daily Activity  Outcome Measure  Difficulty turning over in bed (including adjusting bedclothes, sheets and blankets)?: None Difficulty moving from lying on back to sitting on the side of the bed? : A Little Difficulty sitting down on and standing up from a chair with arms (e.g., wheelchair, bedside commode, etc,.)?: A Little Help needed moving to and from a bed to chair (including a wheelchair)?: A Little Help needed walking in hospital room?: A Little Help needed climbing 3-5 steps with a railing? : A Little 6 Click Score: 19    End of Session Equipment Utilized During Treatment: Oxygen Activity Tolerance: Treatment limited secondary to medical complications (Comment) Patient left: in bed;with call bell/phone within reach;with family/visitor  present Nurse Communication: Mobility status PT Visit Diagnosis: Muscle weakness (generalized) (M62.81);Difficulty in walking, not elsewhere classified (R26.2)     Time: 4540-9811 PT Time Calculation (min) (ACUTE ONLY): 11 min  Charges:  $Gait Training: 8-22 mins                    G Codes:  Functional Assessment Tool Used: AM-PAC 6 Clicks Basic Mobility;Clinical judgement Functional Limitation: Mobility: Walking and moving around Mobility: Walking and Moving Around Current Status (B1478): At least 20 percent but less than 40 percent impaired, limited or restricted Mobility: Walking and Moving Around Goal Status [phone]): At least 1 percent but less than 20 percent impaired, limited or restricted    Christina Sharp, PT, DPT [phone]    Christina Sharp 01/08/2017, 3:14 PM

## 2017-01-08 NOTE — Progress Notes (Signed)
Asked to reevaluate by Dr. Anselm Jungling Patient is well-known to me from prior encounters last year. I have reviewed all of the developments since the time of diagnosis of her cancer I have reviewed several CTs of the chest that have been performed Reviewed her current hospitalization Presently, she has no significant complaints of dyspnea, chest pain, fever, hemoptysis She has a wet, rattling cough productive of clear to white mucus, occasionally tinged with yellow  Vitals:   01/07/17 1935 01/08/17 0349 01/08/17 0500 01/08/17 1002  BP: 117/68 118/71  132/73  Pulse: (!) 113 (!) 104  (!) 103  Resp: 16 17  18   Temp: 97.7 F (36.5 C) 97.8 F (36.6 C)    TempSrc: Oral Oral    SpO2: 94% 95%  94%  Weight:   63.8 kg (140 lb 9.6 oz)   Height:       Pleasant, NAD HEENT WNL No JVD Diffuse bilateral crackles Regular, no M NABS, soft Extremities warm without clubbing or edema No focal neurologic deficits  BMP Latest Ref Rng & Units 01/07/2017 01/06/2017 01/05/2017  Glucose 65 - 99 mg/dL 159(H) 140(H) 153(H)  BUN 6 - 20 mg/dL 26(H) 14 16  Creatinine 0.44 - 1.00 mg/dL 1.12(H) 0.83 0.99  Sodium 135 - 145 mmol/L 141 142 137  Potassium 3.5 - 5.1 mmol/L 3.8 3.8 3.5  Chloride 101 - 111 mmol/L 110 109 101  CO2 22 - 32 mmol/L 22 23 24   Calcium 8.9 - 10.3 mg/dL 8.4(L) 8.8(L) 9.0    CBC Latest Ref Rng & Units 01/07/2017 01/06/2017 01/05/2017  WBC 3.6 - 11.0 K/uL 42.3(H) 26.4(H) 21.9(H)  Hemoglobin 12.0 - 16.0 g/dL 10.0(L) 10.4(L) 11.7(L)  Hematocrit 35.0 - 47.0 % 31.9(L) 31.4(L) 35.9  Platelets 150 - 440 K/uL 374 361 395    Most recent chest x-ray and CT scan reviewed and reveal progressive, extensive bilateral airspace disease  IMPRESSION: - Chronic hypoxemic respiratory failure - 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  Most of this likely represents progressive cancer.  It is impossible to exclude  the possibility of superimposed pneumonia.  However, there is little in her history to support the diagnosis of pneumonia.   PLAN/REC: - Complete a 10-day course of antibiotics as discussed with Dr. Anselm Jungling - Continue nebulized bronchodilators - Continue systemic steroids.  I have reduced the dose today and would taper to off over the next 5 days or so - Recommend discussion with oncology to assess what other therapeutic options might be available - Recommend palliative care consultation to discuss goals of care.  This appears to be progressive and untreatable malignancy.  Therefore, we can anticipate that this will progress to full-blown respiratory failure and we will be faced with decisions regarding intubation and mechanical ventilation.  Given the apparently untreatable nature of her cancer, I think that these measures would be an appropriate for her at this time.  This assessment would change if we were to find an agent that might reverse the inexorable progression of this cancer  PCCM will sign off. Please call if we can be of further assistance    Merton Border, MD PCCM service Mobile [phone] Pager [phone] 01/08/2017 2:35 PM

## 2017-01-08 NOTE — Progress Notes (Signed)
[facility] [place] NAME: Christina Sharp    MR#:  [id number]  DATE OF BIRTH:  16-Mar-1945  SUBJECTIVE:  CHIEF COMPLAINT:   Chief Complaint  Patient presents with  . Nasal Congestion  . Shortness of Breath     Recent pneumonia, now again SOB for 1 week. Found to have PNA. Feels same today as yesterday. Still on 6 ltr oxygen. SOB with minimal exertion and have clear to yellowish sputum today.  REVIEW OF SYSTEMS:  CONSTITUTIONAL: No fever, fatigue or weakness.  EYES: No blurred or double vision.  EARS, NOSE, AND THROAT: No tinnitus or ear pain.  RESPIRATORY: positive for cough, shortness of breath, no wheezing or hemoptysis.  CARDIOVASCULAR: No chest pain, orthopnea, edema.  GASTROINTESTINAL: No nausea, vomiting, diarrhea or abdominal pain.  GENITOURINARY: No dysuria, hematuria.  ENDOCRINE: No polyuria, nocturia,  HEMATOLOGY: No anemia, easy bruising or bleeding SKIN: No rash or lesion. MUSCULOSKELETAL: No joint pain or arthritis.   NEUROLOGIC: No tingling, numbness, weakness.  PSYCHIATRY: No anxiety or depression.   ROS  DRUG ALLERGIES:   Allergies  Allergen Reactions  . Augmentin [Amoxicillin-Pot Clavulanate] Hives  . Penicillins Hives    Has patient had a PCN reaction causing immediate rash, facial/tongue/throat swelling, SOB or lightheadedness with hypotension: No Has patient had a PCN reaction causing severe rash involving mucus membranes or skin necrosis: No Has patient had a PCN reaction that required hospitalization No Has patient had a PCN reaction occurring within the last 10 years: Yes If all of the above answers are "NO", then may proceed with Cephalosporin use.      VITALS:  Blood pressure 136/62, pulse (!) 121, temperature 97.8 F (36.6 C), temperature source Oral, resp. rate 18, height 5\' 3"  (1.6 m), weight 63.8 kg (140 lb 9.6 oz), SpO2 (!) 89 %.  PHYSICAL EXAMINATION:  GENERAL:  71 y.o.-year-old patient lying  in the bed with no acute distress.  EYES: Pupils equal, round, reactive to light and accommodation. No scleral icterus. Extraocular muscles intact.  HEENT: Head atraumatic, normocephalic. Oropharynx and nasopharynx clear.  NECK:  Supple, no jugular venous distention. No thyroid enlargement, no tenderness.  LUNGS: Normal breath sounds bilaterally, no wheezing, have crepitation. No use of accessory muscles of respiration. On 6 ltr oxygen via nasal canula. CARDIOVASCULAR: S1, S2 normal. No murmurs, rubs, or gallops.  ABDOMEN: Soft, nontender, nondistended. Bowel sounds present. No organomegaly or mass.  EXTREMITIES: No pedal edema, cyanosis, or clubbing.  NEUROLOGIC: Cranial nerves II through XII are intact. Muscle strength 5/5 in all extremities. Sensation intact. Gait not checked.  PSYCHIATRIC: The patient is alert and oriented x 3.  SKIN: No obvious rash, lesion, or ulcer.   Physical Exam LABORATORY PANEL:   CBC Recent Labs  Lab 01/07/17 1024  WBC 42.3*  HGB 10.0*  HCT 31.9*  PLT 374   ------------------------------------------------------------------------------------------------------------------  Chemistries  Recent Labs  Lab 01/06/17 0509 01/07/17 1024  NA 142 141  K 3.8 3.8  CL 109 110  CO2 23 22  GLUCOSE 140* 159*  BUN 14 26*  CREATININE 0.83 1.12*  CALCIUM 8.8* 8.4*  AST 16  --   ALT 11*  --   ALKPHOS 82  --   BILITOT 0.5  --    ------------------------------------------------------------------------------------------------------------------  Cardiac Enzymes Recent Labs  Lab 01/05/17 0929  TROPONINI <0.03   ------------------------------------------------------------------------------------------------------------------  RADIOLOGY:  Ct Chest W Contrast  Addendum Date: 01/08/2017   ADDENDUM REPORT: 01/08/2017 14:55 ADDENDUM: Mentioned in  the body of the report, but originally omitted from the impression section is the presence of an area of mass-like  thickening in the fundus of the gallbladder which is nonspecific but concerning for potential neoplasm. This may alternatively simply represent an area of adenomyomatosis. Further evaluation with right upper quadrant abdominal ultrasound is suggested in the near future to better evaluate this finding. These results will be called to the ordering clinician or representative by the Radiologist Assistant, and communication documented in the PACS or zVision Dashboard. Electronically Signed   By: Vinnie Langton M.D.   On: 01/08/2017 14:55   Result Date: 01/08/2017 CLINICAL DATA:  71 year old female with history of unresolved pneumonia. Follow-up study. EXAM: CT CHEST WITH CONTRAST TECHNIQUE: Multidetector CT imaging of the chest was performed during intravenous contrast administration. CONTRAST:  46mL ISOVUE-300 IOPAMIDOL (ISOVUE-300) INJECTION 61% COMPARISON:  Chest CT 12/07/2016. FINDINGS: Cardiovascular: Heart size is borderline enlarged. There is no significant pericardial fluid, thickening or pericardial calcification. There is aortic atherosclerosis, as well as atherosclerosis of the great vessels of the mediastinum and the coronary arteries, including calcified atherosclerotic plaque in the left main, left anterior descending, left circumflex and right coronary arteries. Right internal jugular single-lumen porta cath with tip terminating in the right atrium. Mediastinum/Nodes: Multiple prominent borderline enlarged and mildly enlarged mediastinal and bilateral hilar lymph nodes measuring up to 1.5 cm in short axis in the high right paratracheal nodal station. Esophagus is unremarkable in appearance. No axillary lymphadenopathy. Lungs/Pleura: Widespread mass-like areas of apparent consolidation throughout the lungs bilaterally, increasing in number and size compared to the prior study. The previously measured mass-like areas in the left lower lobe currently measure 4.0 x 7.8 cm (previously 5.7 x 3.8 cm on  prior study) and 7.5 x 4.4 cm (previously 4.0 x 3.2 cm on the prior study from 12/07/2016) on images 85 and 84 respectively of series 6. Additional areas of ground-glass attenuation septal thickening are increasingly prominent throughout both lungs. Diffuse bronchial wall thickening with mild to moderate centrilobular and paraseptal emphysema. Suture line in the periphery of the right lower lobe, presumably from prior open lung biopsy. Upper Abdomen: Mass-like thickening of the fundus of the gallbladder best appreciated on axial image 136 of series 5 where this region measures approximately 2.4 x 2.1 cm. Aortic atherosclerosis. Musculoskeletal: Postoperative changes of prior right-sided thoracotomy are noted. There are no aggressive appearing lytic or blastic lesions noted in the visualized portions of the skeleton. IMPRESSION: 1. Findings remain most compatible with progressive multifocal primary bronchogenic adenocarcinoma (not underlying pneumonia), as detailed above. 2. Aortic atherosclerosis, in addition to left main and 3 vessel coronary artery disease. Assessment for potential risk factor modification, dietary therapy or pharmacologic therapy may be warranted, if clinically indicated. 3. Diffuse bronchial wall thickening with mild to moderate centrilobular and paraseptal emphysema. 4. Additional incidental findings, as above. Aortic Atherosclerosis (ICD10-I70.0) and Emphysema (ICD10-J43.9). Electronically Signed: By: Vinnie Langton M.D. On: 01/08/2017 12:58    ASSESSMENT AND PLAN:   Principal Problem:   Acute respiratory failure with hypoxia (HCC) Active Problems:   Malignant neoplasm of lung (HCC)   HCAP (healthcare-associated pneumonia)   SIRS (systemic inflammatory response syndrome) (HCC)  * Ac on ch respi failure    HCAP suspected   Was on 2 ltr oxygen for last one month, now on 6 ltr- try to taper as tolerated.   On Vanc+ cefepime for HCAP   Cx sent, check for MRSA PCR- negative, stop  vanc.   Appreciated Pulm  help- suggest to taper off steroids and finish total 10 days of ABx as infection can not be ruled out. 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   Pulm suggest to have meeting with palliative care and decide goals of care in respi failure event, if to use or not vent support.  * Emphysema of lung   Dulera, Duoneb and solumedrol  * lepidic adenocarcinoma of lung   Followed by oncology    Very aggressive cancer, not responded to chemo so far.    Pulm called oncology consult to get clear idea about treatment options an encouraged to consider Palliative care.     All the records are reviewed and case discussed with Care Management/Social Workerr. Management plans discussed with the patient, family and they are in agreement.  CODE STATUS: Full.  TOTAL TIME TAKING CARE OF THIS PATIENT: 35 minutes.   POSSIBLE D/C IN 1-2 DAYS, DEPENDING ON CLINICAL CONDITION.   Vaughan Basta M.D on 01/08/2017   Between 7am to 6pm - Pager - [phone]  After 6pm go to [email or web address] - password [email or web address]  Office  [phone]  CC: Primary care physician; Anthonette Legato, MD  Note: This dictation was prepared with Dragon dictation along with smaller phrase technology. Any transcriptional errors that result from this process are unintentional.

## 2017-01-08 NOTE — Progress Notes (Signed)
[facility] Hematology/Oncology Progress Note  Date of admission: 01/05/2017  Hospital day:  01/08/2017  Chief Complaint: Christina Sharp is a 71 y.o. female with metastatic lung cancer who was admitted with acute respiratory failure and hypoxia.  Subjective: Feeling better since admission.  Remains on 6 liter oxygen via nasal cannula.  Desaturates with minimal activity.  Social History: The patient is accompanied by her partner today.  Allergies:  Allergies  Allergen Reactions  . Augmentin [Amoxicillin-Pot Clavulanate] Hives  . Penicillins Hives    Has patient had a PCN reaction causing immediate rash, facial/tongue/throat swelling, SOB or lightheadedness with hypotension: No Has patient had a PCN reaction causing severe rash involving mucus membranes or skin necrosis: No Has patient had a PCN reaction that required hospitalization No Has patient had a PCN reaction occurring within the last 10 years: Yes If all of the above answers are "NO", then may proceed with Cephalosporin use.      Scheduled Medications: . amLODipine  10 mg Oral Daily  . atorvastatin  20 mg Oral q1800  . docusate sodium  100 mg Oral BID  . feeding supplement (ENSURE ENLIVE)  237 mL Oral Q24H  . fluticasone  1 spray Each Nare Daily  . heparin  5,000 Units Subcutaneous Q8H  . hydrALAZINE  50 mg Oral TID  . insulin aspart  0-9 Units Subcutaneous TID WC  . ipratropium-albuterol  3 mL Nebulization QID  . magnesium oxide  400 mg Oral Daily  . [START ON 01/09/2017] methylPREDNISolone (SOLU-MEDROL) injection  40 mg Intravenous Q12H  . metoprolol succinate  100 mg Oral Daily  . mometasone-formoterol  2 puff Inhalation BID  . pantoprazole (PROTONIX) IV  40 mg Intravenous Q12H  . pneumococcal 23 valent vaccine  0.5 mL Intramuscular Tomorrow-1000  . potassium chloride  10 mEq Oral BID  . sodium bicarbonate  1,300 mg Oral Daily    Review of Systems: GENERAL:  Feels "better".  No fevers or  sweats.  Weight stable. PERFORMANCE STATUS (ECOG):  2 HEENT:  No visual changes, sore throat, mouth sores or tenderness. Lungs: Shortness of breath.  Breathing "a little better".  Clear cough with slight yellow tinged sputum.  No hemoptysis. Cardiac:  No chest pain, palpitations, orthopnea, or PND.  GI:  No nausea, vomiting, diarrhea, constipation, melena or hematochezia.   GU:  No urgency, frequency, dysuria, or hematuria. Musculoskeletal:  No bone pain.  No muscle tenderness. Extremities:  No pain or swelling. Skin:  No rash or ulcers. Neuro:  No headache, numbness or weakness, balance or coordination issues. Endocrine:  No diabetes, thyroid issues, hot flashes or night sweats.. Psych:  No mood changes, depression or anxiety. Pain:  No focal pain. Review of systems:  All other systems reviewed and found to be negative.  Physical Exam: Blood pressure 129/61, pulse (!) 109, temperature 97.8 F (36.6 C), temperature source Oral, resp. rate 18, height 5' 3"  (1.6 m), weight 140 lb 9.6 oz (63.8 kg), SpO2 91 %.  GENERAL:  Slightly fatigued appearing woman sitting comfortably on the medical unit in no acute distress. MENTAL STATUS: Alert and oriented to person, place and time. HEAD:Gray short hair.  Normocephalic, atraumatic, face symmetric, no Cushingoid features. EYES:[place]. Pupils equal round and reactive to light and accomodation. No conjunctivitis or scleral icterus. ENT:[place] in place.  No oral lesions. Tonguenormal. Mucous membranes moist. RESPIRATORY:Diffuse squeaks, pops, wheezes, and fine crackles. CARDIOVASCULAR:Regular rate andrhythmwithout murmur, rub or gallop. ABDOMEN:Soft, non-tender, with active bowel sounds, and no  hepatosplenomegaly. No masses. SKIN: No rashes, ulcers or lesions. EXTREMITIES: No edema, no skin discoloration or tenderness. No palpable cords. LYMPHNODES: No palpable cervical, supraclavicular, axillary or inguinal adenopathy   NEUROLOGICAL: Unremarkable. PSYCH: Appropriate.   Results for orders placed or performed during the hospital encounter of 01/05/17 (from the past 48 hour(s))  Glucose, capillary     Status: Abnormal   Collection Time: 01/07/17  7:45 AM  Result Value Ref Range   Glucose-Capillary 154 (H) 65 - 99 mg/dL  CBC     Status: Abnormal   Collection Time: 01/07/17 10:24 AM  Result Value Ref Range   WBC 42.3 (H) 3.6 - 11.0 K/uL   RBC 3.33 (L) 3.80 - 5.20 MIL/uL   Hemoglobin 10.0 (L) 12.0 - 16.0 g/dL   HCT 31.9 (L) 35.0 - 47.0 %   MCV 95.9 80.0 - 100.0 fL   MCH 30.1 26.0 - 34.0 pg   MCHC 31.4 (L) 32.0 - 36.0 g/dL   RDW 17.9 (H) 11.5 - 14.5 %   Platelets 374 150 - 440 K/uL  Basic metabolic panel     Status: Abnormal   Collection Time: 01/07/17 10:24 AM  Result Value Ref Range   Sodium 141 135 - 145 mmol/L   Potassium 3.8 3.5 - 5.1 mmol/L   Chloride 110 101 - 111 mmol/L   CO2 22 22 - 32 mmol/L   Glucose, Bld 159 (H) 65 - 99 mg/dL   BUN 26 (H) 6 - 20 mg/dL   Creatinine, Ser 1.12 (H) 0.44 - 1.00 mg/dL   Calcium 8.4 (L) 8.9 - 10.3 mg/dL   GFR calc non Af Amer 48 (L) >60 mL/min   GFR calc Af Amer 55 (L) >60 mL/min    Comment: (NOTE) The eGFR has been calculated using the CKD EPI equation. This calculation has not been validated in all clinical situations. eGFR's persistently <60 mL/min signify possible Chronic Kidney Disease.    Anion gap 9 5 - 15  Glucose, capillary     Status: Abnormal   Collection Time: 01/07/17 11:52 AM  Result Value Ref Range   Glucose-Capillary 119 (H) 65 - 99 mg/dL  Glucose, capillary     Status: Abnormal   Collection Time: 01/07/17  5:01 PM  Result Value Ref Range   Glucose-Capillary 132 (H) 65 - 99 mg/dL  Glucose, capillary     Status: Abnormal   Collection Time: 01/07/17  8:39 PM  Result Value Ref Range   Glucose-Capillary 186 (H) 65 - 99 mg/dL  Glucose, capillary     Status: Abnormal   Collection Time: 01/08/17  7:37 AM  Result Value Ref Range    Glucose-Capillary 152 (H) 65 - 99 mg/dL  Glucose, capillary     Status: Abnormal   Collection Time: 01/08/17 12:26 PM  Result Value Ref Range   Glucose-Capillary 113 (H) 65 - 99 mg/dL  Culture, expectorated sputum-assessment     Status: None   Collection Time: 01/08/17  2:26 PM  Result Value Ref Range   Specimen Description EXPECTORATED SPUTUM    Special Requests NONE    Sputum evaluation THIS SPECIMEN IS ACCEPTABLE FOR SPUTUM CULTURE    Report Status 01/08/2017 FINAL   Glucose, capillary     Status: Abnormal   Collection Time: 01/08/17  5:08 PM  Result Value Ref Range   Glucose-Capillary 103 (H) 65 - 99 mg/dL  Glucose, capillary     Status: Abnormal   Collection Time: 01/08/17  9:25 PM  Result Value Ref  Range   Glucose-Capillary 176 (H) 65 - 99 mg/dL   Ct Chest W Contrast  Addendum Date: 01/08/2017   ADDENDUM REPORT: 01/08/2017 14:55 ADDENDUM: Mentioned in the body of the report, but originally omitted from the impression section is the presence of an area of mass-like thickening in the fundus of the gallbladder which is nonspecific but concerning for potential neoplasm. This may alternatively simply represent an area of adenomyomatosis. Further evaluation with right upper quadrant abdominal ultrasound is suggested in the near future to better evaluate this finding. These results will be called to the ordering clinician or representative by the Radiologist Assistant, and communication documented in the PACS or zVision Dashboard. Electronically Signed   By: Vinnie Langton M.D.   On: 01/08/2017 14:55   Result Date: 01/08/2017 CLINICAL DATA:  71 year old female with history of unresolved pneumonia. Follow-up study. EXAM: CT CHEST WITH CONTRAST TECHNIQUE: Multidetector CT imaging of the chest was performed during intravenous contrast administration. CONTRAST:  9m ISOVUE-300 IOPAMIDOL (ISOVUE-300) INJECTION 61% COMPARISON:  Chest CT 12/07/2016. FINDINGS: Cardiovascular: Heart size is  borderline enlarged. There is no significant pericardial fluid, thickening or pericardial calcification. There is aortic atherosclerosis, as well as atherosclerosis of the great vessels of the mediastinum and the coronary arteries, including calcified atherosclerotic plaque in the left main, left anterior descending, left circumflex and right coronary arteries. Right internal jugular single-lumen porta cath with tip terminating in the right atrium. Mediastinum/Nodes: Multiple prominent borderline enlarged and mildly enlarged mediastinal and bilateral hilar lymph nodes measuring up to 1.5 cm in short axis in the high right paratracheal nodal station. Esophagus is unremarkable in appearance. No axillary lymphadenopathy. Lungs/Pleura: Widespread mass-like areas of apparent consolidation throughout the lungs bilaterally, increasing in number and size compared to the prior study. The previously measured mass-like areas in the left lower lobe currently measure 4.0 x 7.8 cm (previously 5.7 x 3.8 cm on prior study) and 7.5 x 4.4 cm (previously 4.0 x 3.2 cm on the prior study from 12/07/2016) on images 85 and 84 respectively of series 6. Additional areas of ground-glass attenuation septal thickening are increasingly prominent throughout both lungs. Diffuse bronchial wall thickening with mild to moderate centrilobular and paraseptal emphysema. Suture line in the periphery of the right lower lobe, presumably from prior open lung biopsy. Upper Abdomen: Mass-like thickening of the fundus of the gallbladder best appreciated on axial image 136 of series 5 where this region measures approximately 2.4 x 2.1 cm. Aortic atherosclerosis. Musculoskeletal: Postoperative changes of prior right-sided thoracotomy are noted. There are no aggressive appearing lytic or blastic lesions noted in the visualized portions of the skeleton. IMPRESSION: 1. Findings remain most compatible with progressive multifocal primary bronchogenic adenocarcinoma  (not underlying pneumonia), as detailed above. 2. Aortic atherosclerosis, in addition to left main and 3 vessel coronary artery disease. Assessment for potential risk factor modification, dietary therapy or pharmacologic therapy may be warranted, if clinically indicated. 3. Diffuse bronchial wall thickening with mild to moderate centrilobular and paraseptal emphysema. 4. Additional incidental findings, as above. Aortic Atherosclerosis (ICD10-I70.0) and Emphysema (ICD10-J43.9). Electronically Signed: By: DVinnie LangtonM.D. On: 01/08/2017 12:58    Assessment:  Christina LODWICKis a 71y.o. female with stage IV adenocarcinoma of the lung s/p 4 cycles of carboplatin, Alimta, and Keytruda (02/03/2016 - 04/06/2016), 3 cycles of Taxotere (05/25/2016 - 07/06/2016), Lycera clinical trial of [id number](ROR gamma agonist), and 1 cycle of gemcitabine (11/23/2016 - 11/30/2016).  [facility].  She was treated with aztreonam and discharged on Levaquin.  She also received Solumedrol, Budesonide and Duoneb SVN treatments. She was discharged on 2.5 liters oxygen via [place].    Chest CT on 12/07/2016 revealed significant interval worsening aeration of both lungs. Findings were compatible with marked progression of multifocal bilateral pulmonary adenocarcinoma. Findings were also compatible with progression of disease with superimposed multifocal bilateral infection.  There was progression of mediastinal adenopathy.  Chest CT on 01/08/2017 revealed findings compatible with progressive multifocal primary bronchogenic adenocarcinoma (not underlying pneumonia).    She is currently being treated aggressively with IV Cefepime, steroids, albuterol, and Duonebs.  Oxygen needs have increased from 2 1/2 liters to 6 liters via nasal cannula.  She desaturates with minimal activity.  Plan: 1.  Oncology:  Discussed with patient her treatment to date and apparent progression of  malignancy.  Agree that it is difficult to determine if she has pneumonia superimposed on lung cancer or progressive lung cancer.  Agree with 10 day course of antibiotics to assess if she will have any improvement.  No further standard chemotherapy options are available.  Discussed with Dr. Aniceto Boss about potential future clinical trials.  She would need to improve from a respiratory perspective with decreased oxygen needs (ideally < 3 liters/min).  Discussed consideration of follow-up with Dr Aniceto Boss at [facility] next week.  Unfortunately, we discussed the likely plan for supportive care.  2.  Disposition:  Discuss patient's thoughts about discharge home with continuation of IV antibiotics, oxygen, steroid taper, and albuterol.  She currently feels uneasy and would like to be reassessed on Thursday, 01/10/2017.  Discussed code status.  Patient currently remains at full code, but does note if no options available for her lung cancer, she would not want heroic measures.  She is hopeful that she will improve with antibiotics.   Lequita Asal, MD  01/08/2017, 10:31 PM

## 2017-01-09 ENCOUNTER — Inpatient Hospital Stay: Payer: Medicare Other

## 2017-01-09 DIAGNOSIS — R05 Cough: Secondary | ICD-10-CM

## 2017-01-09 DIAGNOSIS — Z7189 Other specified counseling: Secondary | ICD-10-CM

## 2017-01-09 LAB — GLUCOSE, CAPILLARY
GLUCOSE-CAPILLARY: 126 mg/dL — AB (ref 65–99)
GLUCOSE-CAPILLARY: 134 mg/dL — AB (ref 65–99)
GLUCOSE-CAPILLARY: 109 mg/dL — AB (ref 65–99)
GLUCOSE-CAPILLARY: 188 mg/dL — AB (ref 65–99)

## 2017-01-09 MED ORDER — PNEUMOCOCCAL VAC POLYVALENT 25 MCG/0.5ML IJ INJ
0.5000 mL | INJECTION | Freq: Once | INTRAMUSCULAR | Status: AC
  Administered 2017-01-09: 13:00:00 0.5 mL via INTRAMUSCULAR
  Filled 2017-01-09: qty 0.5

## 2017-01-09 NOTE — Consult Note (Signed)
Consultation Note Date: 01/09/2017   Patient Name: Christina Sharp  DOB: [birth year]  MRN: [id number]  Age / Sex: 71 y.o., female  PCP: Anthonette Legato, MD Referring Physician: Vaughan Basta, *  Reason for Consultation: Establishing goals of care  HPI/Patient Profile:  Christina Sharp is a 71 y.o. female has a past medical history significant for HTN, COPD, CKD, and lung cancer admitted last month for CAP now with 1 week hx of progressive SOB and cough.    Clinical Assessment and Goals of Care: Christina Sharp is resting in bed.She lives with a roommate whom she states is her POA, and has a living will.  She states she has been an outdoor person, and very active, she just retired from [employer] Nov. 28th, 2018. She required no O2 until her PNA diagnosis 1 month ago.   She is on 6 lpm, and becomes SOB with trying to get out of bed to walk. She is hopeful for a clinical trail at [facility] if her energy improves and her O2 needs decreases. She would like to have chest compressions, shocks, and a breathing tube if there is a chance for a meaningful existence. She states she would want to be placed on a ventilator if there were something treatable, but does not want the ventilator being the only thing keeping her alive. She would never want a trach. She is not sure about a feeding tube or dialysis. She states she is aware she is in God's hands.   She states she will continue to think about her options. She is approves of home palliative.       SUMMARY OF RECOMMENDATIONS   Recommend palliative to follow outpatient.   Code Status/Advance Care Planning:  Full code   Palliative Prophylaxis:   Aspiration and Oral Care  Additional Recommendations (Limitations, Scope, Preferences):  No Tracheostomy   Prognosis:   Unable to determine Depending on treatment at [facility].    Discharge Planning: To Be Determined        Primary Diagnoses: Present on Admission: . Malignant neoplasm of lung ([place])   I have reviewed the medical record, interviewed the patient and family, and examined the patient. The following aspects are pertinent.  Past Medical History:  Diagnosis Date  . Cancer ([place])    basal cell   . Emphysema of lung ([place])   . Hypercholesteremia   . Hypertension   . Lung mass   . Renal disorder    renal insufficiency   Social History   Socioeconomic History  . Marital status: Single    Spouse name: None  . Number of children: None  . Years of education: None  . Highest education level: None  Social Needs  . Financial resource strain: Not hard at all  . Food insecurity - worry: Never true  . Food insecurity - inability: Never true  . Transportation needs - medical: No  . Transportation needs - non-medical: No  Occupational History  . None  Tobacco Use  . Smoking status: Former Smoker  Packs/day: 0.50    Years: 45.00    Pack years: 22.50    Types: Cigarettes    Last attempt to quit: 11/06/2015    Years since quitting: 1.1  . Smokeless tobacco: Never Used  . Tobacco comment: hasn't smoked since 11-06-15  Substance and Sexual Activity  . Alcohol use: Yes    Comment: rare  . Drug use: No  . Sexual activity: None  Other Topics Concern  . None  Social History Narrative  . None   Family History  Problem Relation Age of Onset  . Alzheimer's disease Mother   . Heart disease Father   . Heart attack Brother    Scheduled Meds: . amLODipine  10 mg Oral Daily  . atorvastatin  20 mg Oral q1800  . docusate sodium  100 mg Oral BID  . feeding supplement (ENSURE ENLIVE)  237 mL Oral Q24H  . fluticasone  1 spray Each Nare Daily  . heparin  5,000 Units Subcutaneous Q8H  . hydrALAZINE  50 mg Oral TID  . insulin aspart  0-9 Units Subcutaneous TID WC  . ipratropium-albuterol  3 mL Nebulization QID  . magnesium oxide  400 mg Oral Daily  . methylPREDNISolone (SOLU-MEDROL)  injection  40 mg Intravenous Q12H  . metoprolol succinate  100 mg Oral Daily  . mometasone-formoterol  2 puff Inhalation BID  . pantoprazole (PROTONIX) IV  40 mg Intravenous Q12H  . pneumococcal 23 valent vaccine  0.5 mL Intramuscular Tomorrow-1000  . potassium chloride  10 mEq Oral BID  . sodium bicarbonate  1,300 mg Oral Daily   Continuous Infusions: . ceFEPime (MAXIPIME) IV Stopped (01/09/17 0939)   PRN Meds:.acetaminophen **OR** acetaminophen, albuterol, benzonatate, bisacodyl, diphenhydrAMINE, ondansetron **OR** ondansetron (ZOFRAN) IV Medications Prior to Admission:  Prior to Admission medications   Medication Sig Start Date End Date Taking? Authorizing Provider  amLODipine (NORVASC) 10 MG tablet Take 10 mg by mouth daily.   Yes [provider]  atorvastatin (LIPITOR) 20 MG tablet Take 20 mg daily by mouth. 11/25/16  Yes [provider]  hydrALAZINE (APRESOLINE) 50 MG tablet Take 50 mg by mouth 3 (three) times daily.   Yes [provider]  KLOR-CON 10 10 MEQ tablet TAKE 1 TABLET (10 MEQ TOTAL) BY MOUTH 2 (TWO) TIMES DAILY. 08/13/16  Yes Corcoran, Melissa C, MD  magnesium oxide (MAG-OX) 400 MG tablet TAKE 1 TABLET (400 MG TOTAL) BY MOUTH DAILY. 08/02/16  Yes Lloyd Huger, MD  metoprolol succinate (TOPROL-XL) 100 MG 24 hr tablet Take 100 mg by mouth daily. Take with or immediately following a meal.   Yes [provider]  sodium bicarbonate 650 MG tablet Take 1,300 mg by mouth daily.    Yes [provider]  albuterol (PROVENTIL HFA;VENTOLIN HFA) 108 (90 Base) MCG/ACT inhaler Inhale 2 puffs every 6 (six) hours as needed into the lungs for wheezing or shortness of breath. 12/10/16   Loletha Grayer, MD  albuterol (PROVENTIL) (2.5 MG/3ML) 0.083% nebulizer solution Take 3 mLs (2.5 mg total) every 4 (four) hours as needed by nebulization for wheezing or shortness of breath. 12/10/16   Loletha Grayer, MD  benzonatate (TESSALON) 200 MG capsule  Take 1 capsule (200 mg total) by mouth 3 (three) times daily as needed for cough. 12/27/16   Lequita Asal, MD  diphenhydrAMINE (BENADRYL) 50 MG tablet Take 1 tablet (50 mg total) by mouth every 8 (eight) hours as needed for itching or sleep. 12/24/16   Jacquelin Hawking, NP  feeding supplement, ENSURE ENLIVE, (ENSURE ENLIVE) LIQD Take 237 mLs daily by mouth. 12/10/16   Loletha Grayer, MD  metoprolol tartrate (LOPRESSOR) 25 MG tablet Take 1 tablet (25 mg total) by mouth 4 (four) times daily as needed. Patient not taking: Reported on 01/05/2017 12/18/16   Lequita Asal, MD  mometasone-formoterol (DULERA) 100-5 MCG/ACT AERO Inhale 2 puffs 2 (two) times daily into the lungs. Patient not taking: Reported on 12/11/2016 12/11/16   Loletha Grayer, MD  ondansetron (ZOFRAN) 8 MG tablet Take 1 tablet every 8 (eight) hours as needed by mouth.    [provider]  predniSONE (DELTASONE) 20 MG tablet Take 2 tablets (40 mg total) daily with breakfast by mouth. Patient not taking: Reported on 01/05/2017 12/11/16   Loletha Grayer, MD  predniSONE (STERAPRED UNI-PAK 21 TAB) 10 MG (21) TBPK tablet Take as prescribed. Patient not taking: Reported on 01/05/2017 12/24/16   Jacquelin Hawking, NP   Allergies  Allergen Reactions  . Augmentin [Amoxicillin-Pot Clavulanate] Hives  . Penicillins Hives    Has patient had a PCN reaction causing immediate rash, facial/tongue/throat swelling, SOB or lightheadedness with hypotension: No Has patient had a PCN reaction causing severe rash involving mucus membranes or skin necrosis: No Has patient had a PCN reaction that required hospitalization No Has patient had a PCN reaction occurring within the last 10 years: Yes If all of the above answers are "NO", then may proceed with Cephalosporin use.     Review of Systems  Respiratory: Positive for cough.        Increased O2 requirement.     Physical Exam  Vital Signs: BP 135/76 (BP Location: Left Arm)    Pulse 97   Temp 97.8 F (36.6 C) (Oral)   Resp 20   Ht 5\' 3"  (1.6 m)   Wt 63.8 kg (140 lb 9.6 oz)   SpO2 91%   BMI 24.91 kg/m  Pain Assessment: No/denies pain POSS *See Group Information*: 1-Acceptable,Awake and alert Pain Score: 0-No pain   SpO2: SpO2: 91 % O2 Device:SpO2: 91 % O2 Flow Rate: .O2 Flow Rate (L/min): 6 L/min  IO: Intake/output summary:   Intake/Output Summary (Last 24 hours) at 01/09/2017 1222 Last data filed at 01/09/2017 1024 Gross per 24 hour  Intake 600 ml  Output 300 ml  Net 300 ml    LBM: Last BM Date: 01/06/17 Baseline Weight: Weight: 62.1 kg (137 lb) Most recent weight: Weight: 63.8 kg (140 lb 9.6 oz)     Palliative Assessment/Data: 60%     Time In: 11:25 Time Out: 12:15 Time Total: 50 min Greater than 50%  of this time was spent counseling and coordinating care related to the above assessment and plan.  Signed by: Asencion Gowda, NP   Please contact Palliative Medicine Team phone at [phone] for questions and concerns.  For individual provider: See Shea Evans

## 2017-01-09 NOTE — Progress Notes (Signed)
Physical Therapy Treatment Patient Details Name: Christina Sharp MRN: [id number] DOB: 1945/08/11 Today's Date: 01/09/2017    History of Present Illness Patient is a 71 y.o. female admitted on 08 DEC with worsening SOB x1 week. PMH includes malignant neoplasm of lung, HCAP, SIRS, HTN, COPD, and CKD. Patient is on 2.5 L O2 at home.    PT Comments    Pt is making limited progress towards goals secondary to medical status including O2 sats. Able to tolerate OOB mobility including there-ex, however sats quickly decrease. Pt very self aware of O2 levels, encouraged to continue activity as tolerated. Will continue to progress.   Follow Up Recommendations  Home health PT     Equipment Recommendations  Other (comment)    Recommendations for Other Services       Precautions / Restrictions Precautions Precautions: None Restrictions Weight Bearing Restrictions: No    Mobility  Bed Mobility Overal bed mobility: Modified Independent             General bed mobility comments: able to sit at EOB with safe technique with ease of movement.  Transfers Overall transfer level: Modified independent Equipment used: None             General transfer comment: transfer with safe technique. At first, slightly unsteady, however able to stand with upright posture  Ambulation/Gait Ambulation/Gait assistance: Supervision Ambulation Distance (Feet): 18 Feet Assistive device: None Gait Pattern/deviations: Step-to pattern     General Gait Details: marching in place as well as side stepping alongside bed. Slight unsteadiness, however safe technique. O2 sats decrease quickly requiring rest break. Sats decrease to 80% this date, however then quickly improve to 89% with break.   Stairs            Wheelchair Mobility    Modified Rankin (Stroke Patients Only)       Balance                                            Cognition Arousal/Alertness:  Awake/alert Behavior During Therapy: WFL for tasks assessed/performed Overall Cognitive Status: Within Functional Limits for tasks assessed                                        Exercises Other Exercises Other Exercises: Seated/standing ther-ex performed including LAQ, standing marching, and hip abd. All ther-ex performed x 10 reps with B LE with seated rest breaks as necessary due to O2 sats.    General Comments        Pertinent Vitals/Pain Pain Assessment: No/denies pain    Home Living                      Prior Function            PT Goals (current goals can now be found in the care plan section) Acute Rehab PT Goals Patient Stated Goal: To get stronger PT Goal Formulation: With patient Time For Goal Achievement: 01/19/17 Potential to Achieve Goals: Good Progress towards PT goals: Progressing toward goals    Frequency    Min 2X/week      PT Plan Current plan remains appropriate    Co-evaluation              AM-PAC PT "6 Clicks"  Daily Activity  Outcome Measure  Difficulty turning over in bed (including adjusting bedclothes, sheets and blankets)?: None Difficulty moving from lying on back to sitting on the side of the bed? : None Difficulty sitting down on and standing up from a chair with arms (e.g., wheelchair, bedside commode, etc,.)?: None Help needed moving to and from a bed to chair (including a wheelchair)?: A Little Help needed walking in hospital room?: A Little Help needed climbing 3-5 steps with a railing? : A Little 6 Click Score: 21    End of Session Equipment Utilized During Treatment: Oxygen Activity Tolerance: Treatment limited secondary to medical complications (Comment) Patient left: in bed;with call bell/phone within reach;with family/visitor present Nurse Communication: Mobility status PT Visit Diagnosis: Muscle weakness (generalized) (M62.81);Difficulty in walking, not elsewhere classified (R26.2)      Time: 1308-6578 PT Time Calculation (min) (ACUTE ONLY): 14 min  Charges:  $Therapeutic Exercise: 8-22 mins                    G Codes:  Functional Assessment Tool Used: AM-PAC 6 Clicks Basic Mobility;Clinical judgement Functional Limitation: Mobility: Walking and moving around Mobility: Walking and Moving Around Current Status (I6962): At least 20 percent but less than 40 percent impaired, limited or restricted Mobility: Walking and Moving Around Goal Status [phone]): At least 1 percent but less than 20 percent impaired, limited or restricted    Greggory Stallion, PT, DPT [phone]    Christina Sharp 01/09/2017, 3:11 PM

## 2017-01-09 NOTE — Progress Notes (Signed)
[facility] [place] NAME: Christina Sharp    MR#:  [id number]  DATE OF BIRTH:  13-Sep-1945  SUBJECTIVE:  CHIEF COMPLAINT:   Chief Complaint  Patient presents with  . Nasal Congestion  . Shortness of Breath     Recent pneumonia, now again SOB for 1 week. Found to have PNA. Feels same today as yesterday. Still on 6 ltr oxygen. SOB with minimal exertion .  REVIEW OF SYSTEMS:  CONSTITUTIONAL: No fever, fatigue or weakness.  EYES: No blurred or double vision.  EARS, NOSE, AND THROAT: No tinnitus or ear pain.  RESPIRATORY: positive for cough, shortness of breath, no wheezing or hemoptysis.  CARDIOVASCULAR: No chest pain, orthopnea, edema.  GASTROINTESTINAL: No nausea, vomiting, diarrhea or abdominal pain.  GENITOURINARY: No dysuria, hematuria.  ENDOCRINE: No polyuria, nocturia,  HEMATOLOGY: No anemia, easy bruising or bleeding SKIN: No rash or lesion. MUSCULOSKELETAL: No joint pain or arthritis.   NEUROLOGIC: No tingling, numbness, weakness.  PSYCHIATRY: No anxiety or depression.   ROS  DRUG ALLERGIES:   Allergies  Allergen Reactions  . Augmentin [Amoxicillin-Pot Clavulanate] Hives  . Penicillins Hives    Has patient had a PCN reaction causing immediate rash, facial/tongue/throat swelling, SOB or lightheadedness with hypotension: No Has patient had a PCN reaction causing severe rash involving mucus membranes or skin necrosis: No Has patient had a PCN reaction that required hospitalization No Has patient had a PCN reaction occurring within the last 10 years: Yes If all of the above answers are "NO", then may proceed with Cephalosporin use.      VITALS:  Blood pressure 125/67, pulse (!) 115, temperature 97.9 F (36.6 C), temperature source Oral, resp. rate 18, height 5\' 3"  (1.6 m), weight 63.8 kg (140 lb 9.6 oz), SpO2 94 %.  PHYSICAL EXAMINATION:  GENERAL:  71 y.o.-year-old patient lying in the bed with no acute distress.  EYES:  Pupils equal, round, reactive to light and accommodation. No scleral icterus. Extraocular muscles intact.  HEENT: Head atraumatic, normocephalic. Oropharynx and nasopharynx clear.  NECK:  Supple, no jugular venous distention. No thyroid enlargement, no tenderness.  LUNGS: Normal breath sounds bilaterally, no wheezing, have crepitation. No use of accessory muscles of respiration. On 6 ltr oxygen via nasal canula. CARDIOVASCULAR: S1, S2 normal. No murmurs, rubs, or gallops.  ABDOMEN: Soft, nontender, nondistended. Bowel sounds present. No organomegaly or mass.  EXTREMITIES: No pedal edema, cyanosis, or clubbing.  NEUROLOGIC: Cranial nerves II through XII are intact. Muscle strength 5/5 in all extremities. Sensation intact. Gait not checked.  PSYCHIATRIC: The patient is alert and oriented x 3.  SKIN: No obvious rash, lesion, or ulcer.   Physical Exam LABORATORY PANEL:   CBC Recent Labs  Lab 01/07/17 1024  WBC 42.3*  HGB 10.0*  HCT 31.9*  PLT 374   ------------------------------------------------------------------------------------------------------------------  Chemistries  Recent Labs  Lab 01/06/17 0509 01/07/17 1024  NA 142 141  K 3.8 3.8  CL 109 110  CO2 23 22  GLUCOSE 140* 159*  BUN 14 26*  CREATININE 0.83 1.12*  CALCIUM 8.8* 8.4*  AST 16  --   ALT 11*  --   ALKPHOS 82  --   BILITOT 0.5  --    ------------------------------------------------------------------------------------------------------------------  Cardiac Enzymes Recent Labs  Lab 01/05/17 0929  TROPONINI <0.03   ------------------------------------------------------------------------------------------------------------------  RADIOLOGY:  Dg Forearm Left  Result Date: 01/09/2017 CLINICAL DATA:  Initial evaluation for acute swelling to medial left forearm. EXAM: LEFT FOREARM - 2  VIEW COMPARISON:  None. FINDINGS: Prominent soft tissue swelling present at the medial aspect of the proximal left forearm.  No associated soft tissue emphysema. No radiopaque foreign body. Underlying osseous structures are normal in appearance without acute fracture or dislocation. No findings to suggest osteomyelitis. Degenerative changes noted about the wrist and elbow. Osteopenia noted. IMPRESSION: 1. Focal soft tissue swelling at the medial aspect of the proximal left forearm, of uncertain etiology, but could reflect sequelae of acute infection/cellulitis or trauma/contusion. No radiographic evidence for osteomyelitis. 2. No acute osseous abnormality about the left forearm. Electronically Signed   By: Jeannine Boga M.D.   On: 01/09/2017 06:07   Ct Chest W Contrast  Addendum Date: 01/08/2017   ADDENDUM REPORT: 01/08/2017 14:55 ADDENDUM: Mentioned in the body of the report, but originally omitted from the impression section is the presence of an area of mass-like thickening in the fundus of the gallbladder which is nonspecific but concerning for potential neoplasm. This may alternatively simply represent an area of adenomyomatosis. Further evaluation with right upper quadrant abdominal ultrasound is suggested in the near future to better evaluate this finding. These results will be called to the ordering clinician or representative by the Radiologist Assistant, and communication documented in the PACS or zVision Dashboard. Electronically Signed   By: Vinnie Langton M.D.   On: 01/08/2017 14:55   Result Date: 01/08/2017 CLINICAL DATA:  71 year old female with history of unresolved pneumonia. Follow-up study. EXAM: CT CHEST WITH CONTRAST TECHNIQUE: Multidetector CT imaging of the chest was performed during intravenous contrast administration. CONTRAST:  6mL ISOVUE-300 IOPAMIDOL (ISOVUE-300) INJECTION 61% COMPARISON:  Chest CT 12/07/2016. FINDINGS: Cardiovascular: Heart size is borderline enlarged. There is no significant pericardial fluid, thickening or pericardial calcification. There is aortic atherosclerosis, as well  as atherosclerosis of the great vessels of the mediastinum and the coronary arteries, including calcified atherosclerotic plaque in the left main, left anterior descending, left circumflex and right coronary arteries. Right internal jugular single-lumen porta cath with tip terminating in the right atrium. Mediastinum/Nodes: Multiple prominent borderline enlarged and mildly enlarged mediastinal and bilateral hilar lymph nodes measuring up to 1.5 cm in short axis in the high right paratracheal nodal station. Esophagus is unremarkable in appearance. No axillary lymphadenopathy. Lungs/Pleura: Widespread mass-like areas of apparent consolidation throughout the lungs bilaterally, increasing in number and size compared to the prior study. The previously measured mass-like areas in the left lower lobe currently measure 4.0 x 7.8 cm (previously 5.7 x 3.8 cm on prior study) and 7.5 x 4.4 cm (previously 4.0 x 3.2 cm on the prior study from 12/07/2016) on images 85 and 84 respectively of series 6. Additional areas of ground-glass attenuation septal thickening are increasingly prominent throughout both lungs. Diffuse bronchial wall thickening with mild to moderate centrilobular and paraseptal emphysema. Suture line in the periphery of the right lower lobe, presumably from prior open lung biopsy. Upper Abdomen: Mass-like thickening of the fundus of the gallbladder best appreciated on axial image 136 of series 5 where this region measures approximately 2.4 x 2.1 cm. Aortic atherosclerosis. Musculoskeletal: Postoperative changes of prior right-sided thoracotomy are noted. There are no aggressive appearing lytic or blastic lesions noted in the visualized portions of the skeleton. IMPRESSION: 1. Findings remain most compatible with progressive multifocal primary bronchogenic adenocarcinoma (not underlying pneumonia), as detailed above. 2. Aortic atherosclerosis, in addition to left main and 3 vessel coronary artery disease.  Assessment for potential risk factor modification, dietary therapy or pharmacologic therapy may be warranted, if clinically indicated.  3. Diffuse bronchial wall thickening with mild to moderate centrilobular and paraseptal emphysema. 4. Additional incidental findings, as above. Aortic Atherosclerosis (ICD10-I70.0) and Emphysema (ICD10-J43.9). Electronically Signed: By: Vinnie Langton M.D. On: 01/08/2017 12:58   US Venous Img Upper Uni Left  Result Date: 01/09/2017 CLINICAL DATA:  Left forearm swelling since this morning EXAM: LEFT UPPER EXTREMITY VENOUS DOPPLER ULTRASOUND TECHNIQUE: Gray-scale sonography with graded compression, as well as color Doppler and duplex ultrasound were performed to evaluate the upper extremity deep venous system from the level of the subclavian vein and including the jugular, axillary, basilic and upper cephalic vein. Spectral Doppler was utilized to evaluate flow at rest and with distal augmentation maneuvers. COMPARISON:  None. FINDINGS: Thrombus within deep veins:  None visualized. Compressibility of deep veins:  Normal. Duplex waveform respiratory phasicity:  Normal. Duplex waveform response to augmentation:  Normal. Venous reflux:  None visualized. Other findings: There is subcutaneous edema at the antecubital fossa and in the proximal forearm. Limited images of the contralateral right subclavian vein unremarkable. IMPRESSION: 1. Negative for left upper extremity DVT. Electronically Signed   By: Lucrezia Europe M.D.   On: 01/09/2017 10:35    ASSESSMENT AND PLAN:   Principal Problem:   Acute respiratory failure with hypoxia (HCC) Active Problems:   Malignant neoplasm of lung (HCC)   HCAP (healthcare-associated pneumonia)   SIRS (systemic inflammatory response syndrome) ([place])  * Ac on ch respi failure    HCAP suspected   Was on 2 ltr oxygen for last one month, now on 6 ltr- try to taper as tolerated.   On Vanc+ cefepime for HCAP   Cx sent, check for MRSA PCR-  negative, stop vanc.   Appreciated Pulm help- suggest to taper off steroids and finish total 10 days of ABx as infection can not be ruled out. 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   Pulm suggest to have meeting with palliative care and decide goals of care in respi failure event, if to use or not vent support.  * Emphysema of lung   Dulera, Duoneb and solumedrol  * lepidic adenocarcinoma of lung   Followed by oncology    Very aggressive cancer, not responded to chemo so far.    Pulm called oncology consult to get clear idea about treatment options an encouraged to consider Palliative care.  Pt is hopeful, she will be able to feel little better and less oxygen requirement, then will be able to [facility] for the research drug soon.     All the records are reviewed and case discussed with Care Management/Social Workerr. Management plans discussed with the patient, family and they are in agreement.  CODE STATUS: Full.  TOTAL TIME TAKING CARE OF THIS PATIENT: 35 minutes.   POSSIBLE D/C IN 1-2 DAYS, DEPENDING ON CLINICAL CONDITION.   Vaughan Basta M.D on 01/09/2017   Between 7am to 6pm - Pager - [phone]  After 6pm go to [email or web address] - password [email or web address] [facility] [facility]  Office  [phone]  CC: Primary care physician; Anthonette Legato, MD  Note: This dictation was prepared with Dragon dictation along with smaller phrase technology. Any transcriptional errors that result from this process are unintentional.

## 2017-01-09 NOTE — Progress Notes (Signed)
Pharmacy Antibiotic Note  Christina Sharp is a 71 y.o. female admitted on 01/05/2017 with pneumonia.  Pharmacy has been consulted for cefepime dosing.  This is day #5 of cefepime, planning for 10 day course of antibiotics per MD notes.  Plan: Continue cefepime 2 g IV q12h  Height: 5\' 3"  (160 cm) Weight: 140 lb 9.6 oz (63.8 kg) IBW/kg (Calculated) : 52.4  Temp (24hrs), Avg:97.8 F (36.6 C), Min:97.8 F (36.6 C), Max:97.8 F (36.6 C)  Recent Labs  Lab 01/05/17 0929 01/06/17 0509 01/07/17 1024  WBC 21.9* 26.4* 42.3*  CREATININE 0.99 0.83 1.12*  LATICACIDVEN 1.1  --   --     Estimated Creatinine Clearance: 40.9 mL/min (A) (by C-G formula based on SCr of 1.12 mg/dL (H)).    Allergies  Allergen Reactions  . Augmentin [Amoxicillin-Pot Clavulanate] Hives  . Penicillins Hives    Has patient had a PCN reaction causing immediate rash, facial/tongue/throat swelling, SOB or lightheadedness with hypotension: No Has patient had a PCN reaction causing severe rash involving mucus membranes or skin necrosis: No Has patient had a PCN reaction that required hospitalization No Has patient had a PCN reaction occurring within the last 10 years: Yes If all of the above answers are "NO", then may proceed with Cephalosporin use.      Antimicrobials this admission: cefepime 12/8 >>  vancomycin 12/8 >> 12/9  Dose adjustments this admission:  Microbiology results: 12/8 BCx: No growth 4 days 12/8 Sputum: Pending  12/9 MRSA PCR: Negative  Thank you for allowing pharmacy to be a part of this patient's care.  [name], PharmD, BCPS Clinical Pharmacist 01/09/2017 9:27 AM

## 2017-01-10 DIAGNOSIS — C3431 Malignant neoplasm of lower lobe, right bronchus or lung: Secondary | ICD-10-CM

## 2017-01-10 LAB — CBC
HCT: 33.5 % — ABNORMAL LOW (ref 35.0–47.0)
Hemoglobin: 10.8 g/dL — ABNORMAL LOW (ref 12.0–16.0)
MCH: 31.1 pg (ref 26.0–34.0)
MCHC: 32.3 g/dL (ref 32.0–36.0)
MCV: 96 fL (ref 80.0–100.0)
Platelets: 311 10*3/uL (ref 150–440)
RBC: 3.49 MIL/uL — ABNORMAL LOW (ref 3.80–5.20)
RDW: 17.8 % — AB (ref 11.5–14.5)
WBC: 19.1 10*3/uL — ABNORMAL HIGH (ref 3.6–11.0)

## 2017-01-10 LAB — GLUCOSE, CAPILLARY
GLUCOSE-CAPILLARY: 128 mg/dL — AB (ref 65–99)
GLUCOSE-CAPILLARY: 117 mg/dL — AB (ref 65–99)
Glucose-Capillary: 134 mg/dL — ABNORMAL HIGH (ref 65–99)
Glucose-Capillary: 157 mg/dL — ABNORMAL HIGH (ref 65–99)

## 2017-01-10 LAB — CULTURE, BLOOD (ROUTINE X 2)
Culture: NO GROWTH
Culture: NO GROWTH
SPECIAL REQUESTS: ADEQUATE
Special Requests: ADEQUATE

## 2017-01-10 LAB — BASIC METABOLIC PANEL
Anion gap: 4 — ABNORMAL LOW (ref 5–15)
BUN: 23 mg/dL — AB (ref 6–20)
CALCIUM: 8.6 mg/dL — AB (ref 8.9–10.3)
CHLORIDE: 104 mmol/L (ref 101–111)
CO2: 29 mmol/L (ref 22–32)
CREATININE: 1.05 mg/dL — AB (ref 0.44–1.00)
GFR calc non Af Amer: 52 mL/min — ABNORMAL LOW (ref >60)
GFR, EST AFRICAN AMERICAN: 60 mL/min — AB (ref >60)
Glucose, Bld: 120 mg/dL — ABNORMAL HIGH (ref 65–99)
Potassium: 4.2 mmol/L (ref 3.5–5.1)
SODIUM: 137 mmol/L (ref 135–145)

## 2017-01-10 MED ORDER — PANTOPRAZOLE SODIUM 40 MG PO TBEC
40.0000 mg | DELAYED_RELEASE_TABLET | Freq: Two times a day (BID) | ORAL | Status: DC
  Administered 2017-01-10 – 2017-01-13 (×7): 40 mg via ORAL
  Filled 2017-01-10 (×7): qty 1

## 2017-01-10 NOTE — Progress Notes (Signed)
[facility] Hematology/Oncology Progress Note  Date of admission: 01/05/2017  Hospital day:  01/10/2017  Chief Complaint: Christina Sharp is a 71 y.o. female with metastatic lung cancer who was admitted with acute respiratory failure and hypoxia.  Subjective:  Feeling about the same.  Remains on 6 liter oxygen via nasal cannula.  Desaturates with minimal activity.  Eating well.  Social History: The patient is accompanied by her partner today.  Allergies:  Allergies  Allergen Reactions  . Augmentin [Amoxicillin-Pot Clavulanate] Hives  . Penicillins Hives    Has patient had a PCN reaction causing immediate rash, facial/tongue/throat swelling, SOB or lightheadedness with hypotension: No Has patient had a PCN reaction causing severe rash involving mucus membranes or skin necrosis: No Has patient had a PCN reaction that required hospitalization No Has patient had a PCN reaction occurring within the last 10 years: Yes If all of the above answers are "NO", then may proceed with Cephalosporin use.      Scheduled Medications: . amLODipine  10 mg Oral Daily  . atorvastatin  20 mg Oral q1800  . docusate sodium  100 mg Oral BID  . feeding supplement (ENSURE ENLIVE)  237 mL Oral Q24H  . fluticasone  1 spray Each Nare Daily  . heparin  5,000 Units Subcutaneous Q8H  . hydrALAZINE  50 mg Oral TID  . insulin aspart  0-9 Units Subcutaneous TID WC  . ipratropium-albuterol  3 mL Nebulization QID  . magnesium oxide  400 mg Oral Daily  . methylPREDNISolone (SOLU-MEDROL) injection  40 mg Intravenous Q12H  . metoprolol succinate  100 mg Oral Daily  . mometasone-formoterol  2 puff Inhalation BID  . pantoprazole  40 mg Oral BID  . potassium chloride  10 mEq Oral BID  . sodium bicarbonate  1,300 mg Oral Daily    Review of Systems: GENERAL:  Feels "a little better".  No fevers or sweats.  Weight stable. PERFORMANCE STATUS (ECOG):  2 HEENT:  No visual changes, sore throat,  mouth sores or tenderness. Lungs: Shortness of breath.  Breathing "a little better".  Clear cough.  No hemoptysis. Cardiac:  No chest pain, palpitations, orthopnea, or PND.  GI:  No nausea, vomiting, diarrhea, constipation, melena or hematochezia.   GU:  No urgency, frequency, dysuria, or hematuria. Musculoskeletal:  No bone pain.  No muscle tenderness. Extremities:  No pain or swelling. Skin:  No rash or ulcers. Neuro:  No headache, numbness or weakness, balance or coordination issues. Endocrine:  No diabetes, thyroid issues, hot flashes or night sweats.. Psych:  No mood changes, depression or anxiety. Pain:  No focal pain. Review of systems:  All other systems reviewed and found to be negative.  Physical Exam: Blood pressure 132/72, pulse 97, temperature 97.8 F (36.6 C), temperature source Oral, resp. rate (!) 22, height 5' 3"  (1.6 m), weight 136 lb 7.4 oz (61.9 kg), SpO2 93 %.  GENERAL:  Fatigued appearing woman sitting comfortably on the medical unit in no acute distress. MENTAL STATUS: Alert and oriented to person, place and time. HEAD:Gray short hair.  Normocephalic, atraumatic, face symmetric, no Cushingoid features. EYES:[place]neyes. Pupils equal round and reactive to light and accomodation. No conjunctivitis or scleral icterus. ENT:Barron in place.  Tonguenormal. Mucous membranes moist. RESPIRATORY:Diffuse squeaks, pops, wheezes, and fine crackles (no change). CARDIOVASCULAR:Regular rate andrhythmwithout murmur, rub or gallop. ABDOMEN:Soft, non-tender, with active bowel sounds, and no hepatosplenomegaly. No masses. SKIN: No rashes, ulcers or lesions. EXTREMITIES: No edema, no skin discoloration or tenderness.  No palpable cords.  NEUROLOGICAL: Unremarkable. PSYCH: Appropriate.   Results for orders placed or performed during the hospital encounter of 01/05/17 (from the past 48 hour(s))  Glucose, capillary     Status: Abnormal   Collection Time: 01/08/17  9:25  PM  Result Value Ref Range   Glucose-Capillary 176 (H) 65 - 99 mg/dL  Glucose, capillary     Status: Abnormal   Collection Time: 01/09/17  7:34 AM  Result Value Ref Range   Glucose-Capillary 126 (H) 65 - 99 mg/dL  Glucose, capillary     Status: Abnormal   Collection Time: 01/09/17 11:17 AM  Result Value Ref Range   Glucose-Capillary 134 (H) 65 - 99 mg/dL  Glucose, capillary     Status: Abnormal   Collection Time: 01/09/17  5:09 PM  Result Value Ref Range   Glucose-Capillary 109 (H) 65 - 99 mg/dL  Glucose, capillary     Status: Abnormal   Collection Time: 01/09/17  9:05 PM  Result Value Ref Range   Glucose-Capillary 188 (H) 65 - 99 mg/dL  CBC     Status: Abnormal   Collection Time: 01/10/17  5:41 AM  Result Value Ref Range   WBC 19.1 (H) 3.6 - 11.0 K/uL   RBC 3.49 (L) 3.80 - 5.20 MIL/uL   Hemoglobin 10.8 (L) 12.0 - 16.0 g/dL   HCT 33.5 (L) 35.0 - 47.0 %   MCV 96.0 80.0 - 100.0 fL   MCH 31.1 26.0 - 34.0 pg   MCHC 32.3 32.0 - 36.0 g/dL   RDW 17.8 (H) 11.5 - 14.5 %   Platelets 311 150 - 440 K/uL  Basic metabolic panel     Status: Abnormal   Collection Time: 01/10/17  5:41 AM  Result Value Ref Range   Sodium 137 135 - 145 mmol/L   Potassium 4.2 3.5 - 5.1 mmol/L   Chloride 104 101 - 111 mmol/L   CO2 29 22 - 32 mmol/L   Glucose, Bld 120 (H) 65 - 99 mg/dL   BUN 23 (H) 6 - 20 mg/dL   Creatinine, Ser 1.05 (H) 0.44 - 1.00 mg/dL   Calcium 8.6 (L) 8.9 - 10.3 mg/dL   GFR calc non Af Amer 52 (L) >60 mL/min   GFR calc Af Amer 60 (L) >60 mL/min    Comment: (NOTE) The eGFR has been calculated using the CKD EPI equation. This calculation has not been validated in all clinical situations. eGFR's persistently <60 mL/min signify possible Chronic Kidney Disease.    Anion gap 4 (L) 5 - 15  Glucose, capillary     Status: Abnormal   Collection Time: 01/10/17  8:05 AM  Result Value Ref Range   Glucose-Capillary 128 (H) 65 - 99 mg/dL  Glucose, capillary     Status: Abnormal   Collection  Time: 01/10/17 11:46 AM  Result Value Ref Range   Glucose-Capillary 117 (H) 65 - 99 mg/dL  Glucose, capillary     Status: Abnormal   Collection Time: 01/10/17  4:48 PM  Result Value Ref Range   Glucose-Capillary 134 (H) 65 - 99 mg/dL   Dg Forearm Left  Result Date: 01/09/2017 CLINICAL DATA:  Initial evaluation for acute swelling to medial left forearm. EXAM: LEFT FOREARM - 2 VIEW COMPARISON:  None. FINDINGS: Prominent soft tissue swelling present at the medial aspect of the proximal left forearm. No associated soft tissue emphysema. No radiopaque foreign body. Underlying osseous structures are normal in appearance without acute fracture or dislocation. No findings to  suggest osteomyelitis. Degenerative changes noted about the wrist and elbow. Osteopenia noted. IMPRESSION: 1. Focal soft tissue swelling at the medial aspect of the proximal left forearm, of uncertain etiology, but could reflect sequelae of acute infection/cellulitis or trauma/contusion. No radiographic evidence for osteomyelitis. 2. No acute osseous abnormality about the left forearm. Electronically Signed   By: Jeannine Boga M.D.   On: 01/09/2017 06:07   US Venous Img Upper Uni Left  Result Date: 01/09/2017 CLINICAL DATA:  Left forearm swelling since this morning EXAM: LEFT UPPER EXTREMITY VENOUS DOPPLER ULTRASOUND TECHNIQUE: Gray-scale sonography with graded compression, as well as color Doppler and duplex ultrasound were performed to evaluate the upper extremity deep venous system from the level of the subclavian vein and including the jugular, axillary, basilic and upper cephalic vein. Spectral Doppler was utilized to evaluate flow at rest and with distal augmentation maneuvers. COMPARISON:  None. FINDINGS: Thrombus within deep veins:  None visualized. Compressibility of deep veins:  Normal. Duplex waveform respiratory phasicity:  Normal. Duplex waveform response to augmentation:  Normal. Venous reflux:  None visualized.  Other findings: There is subcutaneous edema at the antecubital fossa and in the proximal forearm. Limited images of the contralateral right subclavian vein unremarkable. IMPRESSION: 1. Negative for left upper extremity DVT. Electronically Signed   By: Lucrezia Europe M.D.   On: 01/09/2017 10:35    Assessment:  Christina Sharp is a 71 y.o. female with stage IV adenocarcinoma of the lung s/p 4 cycles of carboplatin, Alimta, and Keytruda (02/03/2016 - 04/06/2016), 3 cycles of Taxotere (05/25/2016 - 07/06/2016), Lycera clinical trial of [id number] (ROR gamma agonist), and 1 cycle of gemcitabine (11/23/2016 - 11/30/2016).  She was admitted to [facility] from 12/07/2016 - 1112/2018 with pneumonia.  She was treated with aztreonam and discharged on Levaquin.  She also received Solumedrol, Budesonide and Duoneb SVN treatments. She was discharged on 2.5 liters oxygen via [place].    Chest CT on 12/07/2016 revealed significant interval worsening aeration of both lungs. Findings were compatible with marked progression of multifocal bilateral pulmonary adenocarcinoma. Findings were also compatible with progression of disease with superimposed multifocal bilateral infection.  There was progression of mediastinal adenopathy.  Chest CT on 01/08/2017 revealed findings compatible with progressive multifocal primary bronchogenic adenocarcinoma (not underlying pneumonia).    She is currently being treated aggressively with IV Cefepime, steroids, albuterol, and Duonebs.  Oxygen needs are stable at 6 liters via nasal cannula.  She desaturates with minimal activity.  Plan: 1.  Oncology:  Discussed with patient her treatment to date and apparent progression of malignancy.  Agree that it is difficult to determine if she has pneumonia superimposed on lung cancer or progressive lung cancer.  Agree with 10 day course of antibiotics to assess if she will have any improvement.  No further standard chemotherapy options are available.  Discussed  with Dr. Aniceto Boss about potential future clinical trials.  She would need to improve from a respiratory perspective with decreased oxygen needs (ideally < 3 liters/min).  Discussed consideration of follow-up with Dr Aniceto Boss at [facility] next week.  Unfortunately, we discussed the likely plan for supportive care.  Today we discussed consideration of Hospice at home.  Patient is agreeable to meeting with Hospice to allow her to go home.  If she improves in the future, she wishes to be considered for a clinical trial.  2.  Disposition:  She is feeling better about going home.  She is realistic about her progressive lung cancer and does not want  to be intubated if no options are available for her lung cancer.  Currently she is not eligible for a trial.  No standard chemotherapy options are available.  She remains hopeful that she will improve with antibiotics.   Lequita Asal, MD  01/10/2017, 8:35 PM

## 2017-01-10 NOTE — Progress Notes (Signed)
This writer told in report that patient had been placed on HFNC on previous shift.  Received verbal order from Dr Anselm Jungling this am for HFNC.

## 2017-01-10 NOTE — Progress Notes (Signed)
Pt sat low 90's high 80's, breath sounds extremely diminished with dry non productive cough. trailed on Heated High Flow Nasal Cannula 35L 45%, pt tolerated well with moist productive cough with sats in mid 90's will attempt titration of FiO2 throughout remainder of shift. Pt stated she felt better RN reported pt resting a little more comfortably and sleeping well.

## 2017-01-10 NOTE — Progress Notes (Signed)
PHARMACIST - PHYSICIAN COMMUNICATION  DR:   Anselm Jungling  CONCERNING: IV to Oral Route Change Policy  RECOMMENDATION: This patient is receiving pantoprazole by the intravenous route.  Based on criteria approved by the Pharmacy and Therapeutics Committee, the intravenous medication(s) is/are being converted to the equivalent oral dose form(s).   DESCRIPTION: These criteria include:  The patient is eating (either orally or via tube) and/or has been taking other orally administered medications for a least 24 hours  The patient has no evidence of active gastrointestinal bleeding or impaired GI absorption (gastrectomy, short bowel, patient on TNA or NPO).  If you have questions about this conversion, please contact the [place], [facility] 01/10/2017 9:05 AM

## 2017-01-11 ENCOUNTER — Telehealth: Payer: Self-pay | Admitting: *Deleted

## 2017-01-11 LAB — CULTURE, RESPIRATORY W GRAM STAIN

## 2017-01-11 LAB — GLUCOSE, CAPILLARY
GLUCOSE-CAPILLARY: 92 mg/dL (ref 65–99)
GLUCOSE-CAPILLARY: 170 mg/dL — AB (ref 65–99)
Glucose-Capillary: 166 mg/dL — ABNORMAL HIGH (ref 65–99)
Glucose-Capillary: 123 mg/dL — ABNORMAL HIGH (ref 65–99)

## 2017-01-11 LAB — CULTURE, RESPIRATORY

## 2017-01-11 MED ORDER — PREDNISONE 50 MG PO TABS
50.0000 mg | ORAL_TABLET | Freq: Every day | ORAL | Status: DC
  Administered 2017-01-12 – 2017-01-13 (×2): 50 mg via ORAL
  Filled 2017-01-11 (×2): qty 1

## 2017-01-11 NOTE — Care Management Note (Addendum)
Case Management Note  Patient Details  Name: Christina Sharp MRN: [id number] Date of Birth: 1945-08-14  Subjective/Objective:                  Admitted to [facility] with the diagnosis of acute respiratory failure. Lives with friend, Delray Alt [phone]). Sees Dr. Holley Raring every 6 months. Goes to the [facility] for services. Prescriptions are filled at [facility] in [place] oxygen per [place] x 1 month at 2.5 liters per nasal cannula continuous. No skilled facility. Nebulizer in the home. Takes care of all basic activities of daily living herself, drives. No falls. Decreased appetite.   Action/Plan: Physical therapy evaluation completed. Recommending home with home health and therapy. Ms. Badders would like to go home with hospice services in place. Discussed agencies. [place]. Flo Shanks, RN representative for [facility] updated   Expected Discharge Date:                  Expected Discharge Plan:     In-House Referral:   yes  Discharge planning Services     Post Acute Care Choice:   yes Choice offered to:   patient  DME Arranged:    DME Agency:     HH Arranged:   yes HH Agency:   [place]  Status of Service:     If discussed at [place] of Stay Meetings, dates discussed:    Additional Comments:  Shelbie Ammons, RN MSN CCM Care Management [phone] 01/11/2017, 1:25 PM

## 2017-01-11 NOTE — Progress Notes (Signed)
New referral for [facility] services at home received from [facility]. Patient is a 71 year old woman with a known history of metastatic lung cancer who was admitted to [facility] from home on 12/8 with acute respiratory failure and hypoxia. CXR in the ED showed bilateral pneumonia. She has been treated with IV antibiotics, scheduled nebulizer treatments and steroids. She has continued to have high oxygen requirements including hi flo, baseline is 2 liters, currently on 5 liters via nasal cannula. Palliative Medicine was consulted for goals of care, she was also seen by her oncologist Dr. Mike Gip. She is not a candidate for any further treatment. Plan is for discharge home with the support of hospice services.  Writer met in the room with patient, who goes by Christina Sharp and her friend Langley Gauss to initiate education regarding hospice services, philosophy and team approach to care with good understanding voiced. Questions answered. Patient alert and oriented and participated in the discussion.  DME requested and ordered: 10 liter oxygen concentrator, BSC and nebulizer machine. Denise to be the contact for delivery. CMRN Hassan Rowan updated. Plan is for discharge over the weekend depending on oxygen requirments. Patient information faxed to referral. Hospice information and contact number given to [facility]. Thank you. Flo Shanks RN, BSN, [facility] [place], hospital liaison [phone]

## 2017-01-11 NOTE — Progress Notes (Signed)
[place] [facility] [place] NAME: Christina Sharp    MR#:  [id number]  DATE OF BIRTH:  1945-10-20  SUBJECTIVE:  CHIEF COMPLAINT:   Chief Complaint  Patient presents with  . Nasal Congestion  . Shortness of Breath     Recent pneumonia, now again SOB for 1 week. Found to have PNA.   Remains of high oxygen requirement and SOB on minimal exertion, today able to come down to 5 ltr/min oxygen.  REVIEW OF SYSTEMS:  CONSTITUTIONAL: No fever, fatigue or weakness.  EYES: No blurred or double vision.  EARS, NOSE, AND THROAT: No tinnitus or ear pain.  RESPIRATORY: positive for cough, shortness of breath, no wheezing or hemoptysis.  CARDIOVASCULAR: No chest pain, orthopnea, edema.  GASTROINTESTINAL: No nausea, vomiting, diarrhea or abdominal pain.  GENITOURINARY: No dysuria, hematuria.  ENDOCRINE: No polyuria, nocturia,  HEMATOLOGY: No anemia, easy bruising or bleeding SKIN: No rash or lesion. MUSCULOSKELETAL: No joint pain or arthritis.   NEUROLOGIC: No tingling, numbness, weakness.  PSYCHIATRY: No anxiety or depression.   ROS  DRUG ALLERGIES:   Allergies  Allergen Reactions  . Augmentin [Amoxicillin-Pot Clavulanate] Hives  . Penicillins Hives    Has patient had a PCN reaction causing immediate rash, facial/tongue/throat swelling, SOB or lightheadedness with hypotension: No Has patient had a PCN reaction causing severe rash involving mucus membranes or skin necrosis: No Has patient had a PCN reaction that required hospitalization No Has patient had a PCN reaction occurring within the last 10 years: Yes If all of the above answers are "NO", then may proceed with Cephalosporin use.      VITALS:  Blood pressure 108/69, pulse (!) 108, temperature 97.7 F (36.5 C), temperature source Oral, resp. rate 18, height 5\' 3"  (1.6 m), weight 61.4 kg (135 lb 5 oz), SpO2 91 %.  PHYSICAL EXAMINATION:  GENERAL:  71 y.o.-year-old patient lying in the bed with  no acute distress.  EYES: Pupils equal, round, reactive to light and accommodation. No scleral icterus. Extraocular muscles intact.  HEENT: Head atraumatic, normocephalic. Oropharynx and nasopharynx clear.  NECK:  Supple, no jugular venous distention. No thyroid enlargement, no tenderness.  LUNGS: Normal breath sounds bilaterally, no wheezing, have crepitation. No use of accessory muscles of respiration. On 5ltr/min oxygen . CARDIOVASCULAR: S1, S2 normal. No murmurs, rubs, or gallops.  ABDOMEN: Soft, nontender, nondistended. Bowel sounds present. No organomegaly or mass.  EXTREMITIES: No pedal edema, cyanosis, or clubbing.  NEUROLOGIC: Cranial nerves II through XII are intact. Muscle strength 5/5 in all extremities. Sensation intact. Gait not checked.  PSYCHIATRIC: The patient is alert and oriented x 3.  SKIN: No obvious rash, lesion, or ulcer.   Physical Exam LABORATORY PANEL:   CBC Recent Labs  Lab 01/10/17 0541  WBC 19.1*  HGB 10.8*  HCT 33.5*  PLT 311   ------------------------------------------------------------------------------------------------------------------  Chemistries  Recent Labs  Lab 01/06/17 0509  01/10/17 0541  NA 142   < > 137  K 3.8   < > 4.2  CL 109   < > 104  CO2 23   < > 29  GLUCOSE 140*   < > 120*  BUN 14   < > 23*  CREATININE 0.83   < > 1.05*  CALCIUM 8.8*   < > 8.6*  AST 16  --   --   ALT 11*  --   --   ALKPHOS 82  --   --   BILITOT 0.5  --   --    < > =  values in this interval not displayed.   ------------------------------------------------------------------------------------------------------------------  Cardiac Enzymes Recent Labs  Lab 01/05/17 0929  TROPONINI <0.03   ------------------------------------------------------------------------------------------------------------------  RADIOLOGY:  No results found.  ASSESSMENT AND PLAN:   Principal Problem:   Acute respiratory failure with hypoxia (HCC) Active Problems:    Malignant neoplasm of lung (HCC)   HCAP (healthcare-associated pneumonia)   SIRS (systemic inflammatory response syndrome) (HCC)  * Ac on ch respi failure    HCAP suspected   Was on 2 ltr oxygen for last one month, now on 6 ltr- try to taper as tolerated.   On Vanc+ cefepime for HCAP   Cx sent, check for MRSA PCR- negative, stop vanc.   Appreciated Pulm help- suggest to taper off steroids ( started on oral now) and finish total 10  days of ABx as infection can not be ruled out.  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   If pt is able to be tapered up to 4 ltr/ min- will d.c home with palliative services, if not may have to revisit her goals in hospital.  She have now agreed on DNR and hospice care on d/c at home.   When and if in future, she will be comfortable on 3-4 ltr, she will contact [facility] and get enrolled for research chemo therapy. But currently she is not a candidate.  * Emphysema of lung   Dulera, Duoneb and solumedrol  * lepidic adenocarcinoma of lung   Followed by oncology   Very aggressive cancer, not responded to chemo so far.   Pulm called oncology consult to get clear idea about treatment options an encouraged to consider Palliative care.  Pt is hopeful, she will be able to feel little better and less oxygen requirement, after finishing 10 days of Abx and then she agreed on DNR and hospice at home.    All the records are reviewed and case discussed with Care Management/Social Workerr. Management plans discussed with the patient, family and they are in agreement.  CODE STATUS: Full.  TOTAL TIME TAKING CARE OF THIS PATIENT: 35 minutes.   POSSIBLE D/C IN 1-2 DAYS, DEPENDING ON CLINICAL CONDITION.   Vaughan Basta M.D on 01/11/2017   Between 7am to 6pm - Pager - [phone]  After 6pm go to [email or web address] - password [email or web address] [facility]  Office  [phone]  CC: Primary care physician; Anthonette Legato, MD  Note: This dictation was prepared with Dragon dictation along with smaller phrase technology. Any transcriptional errors that result from this process are unintentional.

## 2017-01-11 NOTE — Progress Notes (Signed)
Pharmacy Antibiotic Note  Christina Sharp is a 71 y.o. female admitted on 01/05/2017 with pneumonia and lung cancer. Pharmacy has been consulted for cefepime dosing.  This is day #7 of cefepime, planning for 10 day course of antibiotics per MD notes.  Plan: Continue cefepime 2 g IV q12h  Height: 5\' 3"  (160 cm) Weight: 135 lb 5 oz (61.4 kg) IBW/kg (Calculated) : 52.4  Temp (24hrs), Avg:97.8 F (36.6 C), Min:97.5 F (36.4 C), Max:98 F (36.7 C)  Recent Labs  Lab 01/05/17 0929 01/06/17 0509 01/07/17 1024 01/10/17 0541  WBC 21.9* 26.4* 42.3* 19.1*  CREATININE 0.99 0.83 1.12* 1.05*  LATICACIDVEN 1.1  --   --   --     Estimated Creatinine Clearance: 40.1 mL/min (A) (by C-G formula based on SCr of 1.05 mg/dL (H)).    Allergies  Allergen Reactions  . Augmentin [Amoxicillin-Pot Clavulanate] Hives  . Penicillins Hives    Has patient had a PCN reaction causing immediate rash, facial/tongue/throat swelling, SOB or lightheadedness with hypotension: No Has patient had a PCN reaction causing severe rash involving mucus membranes or skin necrosis: No Has patient had a PCN reaction that required hospitalization No Has patient had a PCN reaction occurring within the last 10 years: Yes If all of the above answers are "NO", then may proceed with Cephalosporin use.      Antimicrobials this admission: cefepime 12/8 >>  vancomycin 12/8 >> 12/9  Dose adjustments this admission:  Microbiology results: 12/8 BCx: No growth final 12/8 Sputum: Pending  12/9 MRSA PCR: Negative  Thank you for allowing pharmacy to be a part of this patient's care.  [name], PharmD, BCPS Clinical Pharmacist 01/11/2017 9:36 AM

## 2017-01-11 NOTE — Telephone Encounter (Signed)
Hospice called to state that had received a referral for this patient. They need a verbal order from Dr. Mike Gip to open the case. Langley Gauss [phone]

## 2017-01-11 NOTE — Telephone Encounter (Signed)
Writer gave verbal order to [place].

## 2017-01-11 NOTE — Progress Notes (Signed)
[facility] [place] NAME: Christina Sharp    MR#:  [id number]  DATE OF BIRTH:  [birth year]  SUBJECTIVE:  CHIEF COMPLAINT:   Chief Complaint  Patient presents with  . Nasal Congestion  . Shortness of Breath     Recent pneumonia, now again SOB for 1 week. Found to have PNA. Feels same today as yesterday. Still on 6 ltr oxygen. SOB with minimal exertion . Last night, had been Increased to HFNC, pt is upset about that and said, she was feeling fine with 6 ltr oxygen.  REVIEW OF SYSTEMS:  CONSTITUTIONAL: No fever, fatigue or weakness.  EYES: No blurred or double vision.  EARS, NOSE, AND THROAT: No tinnitus or ear pain.  RESPIRATORY: positive for cough, shortness of breath, no wheezing or hemoptysis.  CARDIOVASCULAR: No chest pain, orthopnea, edema.  GASTROINTESTINAL: No nausea, vomiting, diarrhea or abdominal pain.  GENITOURINARY: No dysuria, hematuria.  ENDOCRINE: No polyuria, nocturia,  HEMATOLOGY: No anemia, easy bruising or bleeding SKIN: No rash or lesion. MUSCULOSKELETAL: No joint pain or arthritis.   NEUROLOGIC: No tingling, numbness, weakness.  PSYCHIATRY: No anxiety or depression.   ROS  DRUG ALLERGIES:   Allergies  Allergen Reactions  . Augmentin [Amoxicillin-Pot Clavulanate] Hives  . Penicillins Hives    Has patient had a PCN reaction causing immediate rash, facial/tongue/throat swelling, SOB or lightheadedness with hypotension: No Has patient had a PCN reaction causing severe rash involving mucus membranes or skin necrosis: No Has patient had a PCN reaction that required hospitalization No Has patient had a PCN reaction occurring within the last 10 years: Yes If all of the above answers are "NO", then may proceed with Cephalosporin use.      VITALS:  Blood pressure 117/74, pulse 87, temperature (!) 97.5 F (36.4 C), temperature source Oral, resp. rate (!) 22, height 5\' 3"  (1.6 m), weight 61.4 kg (135 lb 5 oz), SpO2 95  %.  PHYSICAL EXAMINATION:  GENERAL:  71 y.o.-year-old patient lying in the bed with no acute distress.  EYES: Pupils equal, round, reactive to light and accommodation. No scleral icterus. Extraocular muscles intact.  HEENT: Head atraumatic, normocephalic. Oropharynx and nasopharynx clear.  NECK:  Supple, no jugular venous distention. No thyroid enlargement, no tenderness.  LUNGS: Normal breath sounds bilaterally, no wheezing, have crepitation. No use of accessory muscles of respiration. On HFNC oxygen . CARDIOVASCULAR: S1, S2 normal. No murmurs, rubs, or gallops.  ABDOMEN: Soft, nontender, nondistended. Bowel sounds present. No organomegaly or mass.  EXTREMITIES: No pedal edema, cyanosis, or clubbing.  NEUROLOGIC: Cranial nerves II through XII are intact. Muscle strength 5/5 in all extremities. Sensation intact. Gait not checked.  PSYCHIATRIC: The patient is alert and oriented x 3.  SKIN: No obvious rash, lesion, or ulcer.   Physical Exam LABORATORY PANEL:   CBC Recent Labs  Lab 01/10/17 0541  WBC 19.1*  HGB 10.8*  HCT 33.5*  PLT 311   ------------------------------------------------------------------------------------------------------------------  Chemistries  Recent Labs  Lab 01/06/17 0509  01/10/17 0541  NA 142   < > 137  K 3.8   < > 4.2  CL 109   < > 104  CO2 23   < > 29  GLUCOSE 140*   < > 120*  BUN 14   < > 23*  CREATININE 0.83   < > 1.05*  CALCIUM 8.8*   < > 8.6*  AST 16  --   --   ALT 11*  --   --  ALKPHOS 82  --   --   BILITOT 0.5  --   --    < > = values in this interval not displayed.   ------------------------------------------------------------------------------------------------------------------  Cardiac Enzymes Recent Labs  Lab 01/05/17 0929  TROPONINI <0.03   ------------------------------------------------------------------------------------------------------------------  RADIOLOGY:  US Venous Img Upper Uni Left  Result Date:  01/09/2017 CLINICAL DATA:  Left forearm swelling since this morning EXAM: LEFT UPPER EXTREMITY VENOUS DOPPLER ULTRASOUND TECHNIQUE: Gray-scale sonography with graded compression, as well as color Doppler and duplex ultrasound were performed to evaluate the upper extremity deep venous system from the level of the subclavian vein and including the jugular, axillary, basilic and upper cephalic vein. Spectral Doppler was utilized to evaluate flow at rest and with distal augmentation maneuvers. COMPARISON:  None. FINDINGS: Thrombus within deep veins:  None visualized. Compressibility of deep veins:  Normal. Duplex waveform respiratory phasicity:  Normal. Duplex waveform response to augmentation:  Normal. Venous reflux:  None visualized. Other findings: There is subcutaneous edema at the antecubital fossa and in the proximal forearm. Limited images of the contralateral right subclavian vein unremarkable. IMPRESSION: 1. Negative for left upper extremity DVT. Electronically Signed   By: Lucrezia Europe M.D.   On: 01/09/2017 10:35    ASSESSMENT AND PLAN:   Principal Problem:   Acute respiratory failure with hypoxia (HCC) Active Problems:   Malignant neoplasm of lung (HCC)   HCAP (healthcare-associated pneumonia)   SIRS (systemic inflammatory response syndrome) ([place])  * Ac on ch respi failure    HCAP suspected   Was on 2 ltr oxygen for last one month, now on 6 ltr- try to taper as tolerated.   On Vanc+ cefepime for HCAP   Cx sent, check for MRSA PCR- negative, stop vanc.   Appreciated Pulm help- suggest to taper off steroids and finish total 10 days of ABx as infection can not be ruled out. 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   Pulm suggest to have meeting with palliative care and decide goals of care in respi failure event, if to use or not vent support.  If pt is able to be tapered up to 3-4 ltr/ min- will d.c home with palliative services, if not may have to revisit her  goals in hospital.  had been on HFNC last night, will try to taper again.  * Emphysema of lung   Dulera, Duoneb and solumedrol  * lepidic adenocarcinoma of lung   Followed by oncology    Very aggressive cancer, not responded to chemo so far.    Pulm called oncology consult to get clear idea about treatment options an encouraged to consider Palliative care.  Pt is hopeful, she will be able to feel little better and less oxygen requirement, then will be able to follow at [facility] for the research drug soon.     All the records are reviewed and case discussed with Care Management/Social Workerr. Management plans discussed with the patient, family and they are in agreement.  CODE STATUS: Full.  TOTAL TIME TAKING CARE OF THIS PATIENT: 35 minutes.   POSSIBLE D/C IN 1-2 DAYS, DEPENDING ON CLINICAL CONDITION.   Vaughan Basta M.D on 01/11/2017   Between 7am to 6pm - Pager - [phone]  After 6pm go to [email or web address] - password [email or web address] [facility]  Office  [phone]  CC: Primary care physician; Anthonette Legato, MD  Note: This dictation was prepared with Dragon dictation along with smaller phrase technology. Any transcriptional  errors that result from this process are unintentional.

## 2017-01-11 NOTE — Progress Notes (Signed)
PT Cancellation Note  Patient Details Name: RAYE WIENS MRN: [id number] DOB: 06-Jul-1945   Cancelled Treatment:    Reason Eval/Treat Not Completed: Other (comment). Treatment attempted, however pt about to be weaned down and wishes to defer therapy this date. Pt reports she just performed some bed level there-ex successfully. Went over HEP in standing position and pt agreeable to perform this afternoon on own time. Does not need PT in room for completion of exercises, therapist agreeable to have partner with pt for supervision. Will follow from a distance as pt becoming more independent. Will work with pt on Monday if still in hospital.   Vonna Brabson 01/11/2017, 10:57 AM  Greggory Stallion, PT, DPT [phone]

## 2017-01-11 NOTE — Telephone Encounter (Signed)
That is fine. Proceed with Hospice referral to determine goals of care and symptom management. Will require high flow home oxygen.

## 2017-01-12 LAB — GLUCOSE, CAPILLARY
GLUCOSE-CAPILLARY: 274 mg/dL — AB (ref 65–99)
GLUCOSE-CAPILLARY: 92 mg/dL (ref 65–99)
Glucose-Capillary: 109 mg/dL — ABNORMAL HIGH (ref 65–99)
Glucose-Capillary: 131 mg/dL — ABNORMAL HIGH (ref 65–99)

## 2017-01-12 NOTE — Care Management (Signed)
Family verbalized concern that have not heard from the equipment company regarding delivery  of home DME prior to patient's discharge 12/16. [employer] contacted family and confirmed delivery for today

## 2017-01-12 NOTE — Progress Notes (Signed)
[facility] at [place] NAME: Christina Sharp    MR#:  [id number]  DATE OF BIRTH:  Aug 01, 1945  SUBJECTIVE:   Patient here due to shortness of breath secondary to underlying lung cancer, pneumonia. Awaiting discharge home with hospice. Respiratory status is stable. Patient's oxygen has been weaned down from 6 to 4 L today.  REVIEW OF SYSTEMS:    Review of Systems  Constitutional: Negative for chills and fever.  HENT: Negative for congestion and tinnitus.   Eyes: Negative for blurred vision and double vision.  Respiratory: Positive for cough and shortness of breath (chronic). Negative for wheezing.   Cardiovascular: Negative for chest pain, orthopnea and PND.  Gastrointestinal: Negative for abdominal pain, diarrhea, nausea and vomiting.  Genitourinary: Negative for dysuria and hematuria.  Neurological: Negative for dizziness, sensory change and focal weakness.  All other systems reviewed and are negative.   Nutrition: Regular Tolerating Diet: Yes Tolerating PT: eval noted.   DRUG ALLERGIES:   Allergies  Allergen Reactions  . Augmentin [Amoxicillin-Pot Clavulanate] Hives  . Penicillins Hives    Has patient had a PCN reaction causing immediate rash, facial/tongue/throat swelling, SOB or lightheadedness with hypotension: No Has patient had a PCN reaction causing severe rash involving mucus membranes or skin necrosis: No Has patient had a PCN reaction that required hospitalization No Has patient had a PCN reaction occurring within the last 10 years: Yes If all of the above answers are "NO", then may proceed with Cephalosporin use.      VITALS:  Blood pressure 125/71, pulse 100, temperature 97.8 F (36.6 C), temperature source Oral, resp. rate 18, height 5\' 3"  (1.6 m), weight 61.3 kg (135 lb 1.6 oz), SpO2 91 %.  PHYSICAL EXAMINATION:   Physical Exam  GENERAL:  71 y.o.-year-old patient lying in bed in no acute distress.  EYES: Pupils  equal, round, reactive to light and accommodation. No scleral icterus. Extraocular muscles intact.  HEENT: Head atraumatic, normocephalic. Oropharynx and nasopharynx clear.  NECK:  Supple, no jugular venous distention. No thyroid enlargement, no tenderness.  LUNGS: Prolonged Insp. & Exp. phase, no wheezing, rales, rhonchi. + use of accessory muscles of respiration.  CARDIOVASCULAR: S1, S2 normal. No murmurs, rubs, or gallops.  ABDOMEN: Soft, nontender, nondistended. Bowel sounds present. No organomegaly or mass.  EXTREMITIES: No cyanosis, clubbing or edema b/l.    NEUROLOGIC: Cranial nerves II through XII are intact. No focal Motor or sensory deficits b/l.   PSYCHIATRIC: The patient is alert and oriented x 3.  SKIN: No obvious rash, lesion, or ulcer.    LABORATORY PANEL:   CBC Recent Labs  Lab 01/10/17 0541  WBC 19.1*  HGB 10.8*  HCT 33.5*  PLT 311   ------------------------------------------------------------------------------------------------------------------  Chemistries  Recent Labs  Lab 01/06/17 0509  01/10/17 0541  NA 142   < > 137  K 3.8   < > 4.2  CL 109   < > 104  CO2 23   < > 29  GLUCOSE 140*   < > 120*  BUN 14   < > 23*  CREATININE 0.83   < > 1.05*  CALCIUM 8.8*   < > 8.6*  AST 16  --   --   ALT 11*  --   --   ALKPHOS 82  --   --   BILITOT 0.5  --   --    < > = values in this interval not displayed.   ------------------------------------------------------------------------------------------------------------------  Cardiac Enzymes  No results for input(s): TROPONINI in the last 168 hours. ------------------------------------------------------------------------------------------------------------------  RADIOLOGY:  No results found.   ASSESSMENT AND PLAN:   71 year old female with past medical history of lung cancer, hypertension, hyperlipidemia, COPD who presented to the hospital due to acute on chronic respiratory failure with hypoxia.  1. Acute  on chronic respiratory failure with hypoxia-secondary to underlying lung cancer COPD combined with pneumonia. -Continue O2 supplementation which has been tapered slowly. She is currently on 4 L. -Patient was on broad-spectrum IV antibiotics with vancomycin, cefepime. Vancomycin has been discontinued. Continue IV cefepime for a total of 10 days of treatment. -Continue supportive care with Dulera, DuoNeb nebs. Continue oral prednisone will continue to taper.  2. 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 Has not responded to chemotherapy or immune modulator therapy. -Followed by Dr. Mike Gip. Seen by palliative care and patient has opted for discharge home with hospice services.  3. COPD-no acute exacerbation. -Continue Dulera, DuoNeb nebs as needed.  4. Essential hypertension-continue Toprol, hydralazine, Norvasc.  5. Hyperlipidemia - cont. Atorvastatin.   6. GERD - cont. Protonix.   Likely discharge Home with Hospice tomorrow.   All the records are reviewed and case discussed with Care Management/Social Worker. Management plans discussed with the patient, family and they are in agreement.  CODE STATUS: DNR  DVT Prophylaxis: Hep. SQ  TOTAL TIME TAKING CARE OF THIS PATIENT: 30 minutes.   POSSIBLE D/C IN 1-2 DAYS, DEPENDING ON CLINICAL CONDITION.   Henreitta Leber M.D on 01/12/2017 at 2:49 PM  Between 7am to 6pm - Pager - [phone]  After 6pm go to [email or web address] - Proofreader  [facility]  Office  [phone]  CC: Primary care physician; Anthonette Legato, MD

## 2017-01-13 LAB — GLUCOSE, CAPILLARY
Glucose-Capillary: 124 mg/dL — ABNORMAL HIGH (ref 65–99)
Glucose-Capillary: 108 mg/dL — ABNORMAL HIGH (ref 65–99)

## 2017-01-13 MED ORDER — HEPARIN SOD (PORK) LOCK FLUSH 100 UNIT/ML IV SOLN
500.0000 [IU] | Freq: Once | INTRAVENOUS | Status: DC
  Filled 2017-01-13: qty 5

## 2017-01-13 MED ORDER — MORPHINE SULFATE (CONCENTRATE) 10 MG/0.5ML PO SOLN: 10.0000 mg | ORAL | 0 refills | Status: AC | PRN

## 2017-01-13 MED ORDER — MORPHINE SULFATE (CONCENTRATE) 10 MG/0.5ML PO SOLN
10.0000 mg | ORAL | Status: DC | PRN
  Administered 2017-01-13: 10 mg via ORAL
  Filled 2017-01-13: qty 1

## 2017-01-13 MED ORDER — PREDNISONE 10 MG PO TABS: ORAL_TABLET | ORAL | 0 refills | Status: AC

## 2017-01-13 NOTE — Progress Notes (Signed)
[facility] order received to discharge pt home with hospice today; Care Management previously set up Hospice services for pt; 911 called for nonemergency transport to pt's home on 6L O2 [place]; advised pt of said information

## 2017-01-13 NOTE — Progress Notes (Signed)
Pt D/C today. Taken home by EMS. Roommate at bedside. Went of D/C papers. IV removed intact. DNR paper given to EMS upon arrival.

## 2017-01-13 NOTE — Care Management (Signed)
Patient is not able to travel by car to home due to dyspnea.  EMS to transport.  Patient has oxygen equipment in the home which was delivered yesterday.  Updated [facility] of anticipated discharge home today.  Updated primary nurse.

## 2017-01-13 NOTE — Care Management Important Message (Signed)
Important Message  Patient Details  Name: Christina Sharp MRN: [id number] Date of Birth: 06/27/1945   Medicare Important Message Given:  Yes Medicare is secondary. Signed IM notice given    Katrina Stack, RN 01/13/2017, 10:23 AM

## 2017-01-13 NOTE — Discharge Summary (Signed)
[facility] NAME: Christina Sharp    MR#:  [id number]  DATE OF BIRTH:  1945/11/11  DATE OF ADMISSION:  01/05/2017 ADMITTING PHYSICIAN: Idelle Crouch, MD  DATE OF DISCHARGE: 01/13/2017  PRIMARY CARE PHYSICIAN: Anthonette Legato, MD    ADMISSION DIAGNOSIS:  Cough [R05] Acute respiratory failure with hypoxia ([place]) [J96.01] HCAP (healthcare-associated pneumonia) [J18.9] Sepsis, due to unspecified organism ([place]) [A41.9]  DISCHARGE DIAGNOSIS:  Principal Problem:   Acute respiratory failure with hypoxia ([place]) Active Problems:   Malignant neoplasm of lung (H[facility])   HCAP (healthcare-associated pneumonia)   SIRS (systemic inflammatory response syndrome) ([place])   SECONDARY DIAGNOSIS:   Past Medical History:  Diagnosis Date  . Cancer ([place])    basal cell   . Emphysema of lung ([place])   . Hypercholesteremia   . Hypertension   . Lung mass   . Renal disorder    renal insufficiency    HOSPITAL COURSE:   71 year old female with past medical history of lung cancer, hypertension, hyperlipidemia, COPD who presented to the hospital due to acute on chronic respiratory failure with hypoxia.  1. Acute on chronic respiratory failure with hypoxia-secondary to underlying lung cancer COPD combined with pneumonia. -Patient was treated with broad-spectrum IV antibiotics with vancomycin, cefepime. She was also given IV steroids which have been tapered to oral prednisone. -She has clinically improved but still requires high amounts of oxygen. She'll be discharged home with hospice services on oxygen supplementation, prednisone taper. She has finished treatment for pneumonia with IV antibiotics here in the hospital.  2. 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 She Has not responded to chemotherapy or immune modulator therapy. -Followed by Dr. Mike Gip. Seen by palliative care and patient has opted for discharge home with hospice services  and she will be discharged home with Hospice today.    3. COPD-no acute exacerbation. -She will cont. Her Albuterol nebs as needed. She was given some Oral Roxanol for [employer].   4. Essential hypertension- She will continue Toprol, hydralazine, Norvasc.  5. Hyperlipidemia - She will cont. Atorvastatin.   6. GERD - She will cont. Protonix.     DISCHARGE CONDITIONS:   Stable.   CONSULTS OBTAINED:  Treatment Team:  Cammie Sickle, MD Lequita Asal, MD  DRUG ALLERGIES:   Allergies  Allergen Reactions  . Augmentin [Amoxicillin-Pot Clavulanate] Hives  . Penicillins Hives    Has patient had a PCN reaction causing immediate rash, facial/tongue/throat swelling, SOB or lightheadedness with hypotension: No Has patient had a PCN reaction causing severe rash involving mucus membranes or skin necrosis: No Has patient had a PCN reaction that required hospitalization No Has patient had a PCN reaction occurring within the last 10 years: Yes If all of the above answers are "NO", then may proceed with Cephalosporin use.      DISCHARGE MEDICATIONS:   Allergies as of 01/13/2017      Reactions   Augmentin [amoxicillin-pot Clavulanate] Hives   Penicillins Hives   Has patient had a PCN reaction causing immediate rash, facial/tongue/throat swelling, SOB or lightheadedness with hypotension: No Has patient had a PCN reaction causing severe rash involving mucus membranes or skin necrosis: No Has patient had a PCN reaction that required hospitalization No Has patient had a PCN reaction occurring within the last 10 years: Yes If all of the above answers are "NO", then may proceed with Cephalosporin use.      Medication List    STOP taking  these medications   metoprolol tartrate 25 MG tablet Commonly known as:  LOPRESSOR     TAKE these medications   albuterol (2.5 MG/3ML) 0.083% nebulizer solution Commonly known as:  PROVENTIL Take 3 mLs (2.5 mg total) every 4 (four)  hours as needed by nebulization for wheezing or shortness of breath.   albuterol 108 (90 Base) MCG/ACT inhaler Commonly known as:  PROVENTIL HFA;VENTOLIN HFA Inhale 2 puffs every 6 (six) hours as needed into the lungs for wheezing or shortness of breath.   amLODipine 10 MG tablet Commonly known as:  NORVASC Take 10 mg by mouth daily.   atorvastatin 20 MG tablet Commonly known as:  LIPITOR Take 20 mg daily by mouth.   benzonatate 200 MG capsule Commonly known as:  TESSALON Take 1 capsule (200 mg total) by mouth 3 (three) times daily as needed for cough.   diphenhydrAMINE 50 MG tablet Commonly known as:  BENADRYL Take 1 tablet (50 mg total) by mouth every 8 (eight) hours as needed for itching or sleep.   feeding supplement (ENSURE ENLIVE) Liqd Take 237 mLs daily by mouth.   hydrALAZINE 50 MG tablet Commonly known as:  APRESOLINE Take 50 mg by mouth 3 (three) times daily.   KLOR-CON 10 10 MEQ tablet Generic drug:  potassium chloride TAKE 1 TABLET (10 MEQ TOTAL) BY MOUTH 2 (TWO) TIMES DAILY.   magnesium oxide 400 MG tablet Commonly known as:  MAG-OX TAKE 1 TABLET (400 MG TOTAL) BY MOUTH DAILY.   metoprolol succinate 100 MG 24 hr tablet Commonly known as:  TOPROL-XL Take 100 mg by mouth daily. Take with or immediately following a meal.   mometasone-formoterol 100-5 MCG/ACT Aero Commonly known as:  DULERA Inhale 2 puffs 2 (two) times daily into the lungs.   morphine CONCENTRATE 10 MG/0.5ML Soln concentrated solution Take 0.5 mLs (10 mg total) by mouth every 2 (two) hours as needed for shortness of breath.   predniSONE 10 MG tablet Commonly known as:  DELTASONE Label  & dispense according to the schedule below. 5 Pills PO for 2 days then, 4 Pills PO for 2 days, 3 Pills PO for 2 days, 2 Pills PO for 2 days, 1 Pill PO for 2 days then STOP. What changed:    medication strength  how much to take  how to take this  when to take this  additional instructions  Another  medication with the same name was removed. Continue taking this medication, and follow the directions you see here.   sodium bicarbonate 650 MG tablet Take 1,300 mg by mouth daily.   ZOFRAN 8 MG tablet Generic drug:  ondansetron Take 1 tablet every 8 (eight) hours as needed by mouth.         DISCHARGE INSTRUCTIONS:   DIET:  Regular diet  DISCHARGE CONDITION:  Stable  ACTIVITY:  Activity as tolerated  OXYGEN:  Home Oxygen: Yes.     Oxygen Delivery: 4-5 liters/min via Patient connected to nasal cannula oxygen  DISCHARGE LOCATION:  Home with Hospice.    If you experience worsening of your admission symptoms, develop shortness of breath, life threatening emergency, suicidal or homicidal thoughts you must seek medical attention immediately by calling 911 or calling your MD immediately  if symptoms less severe.  You Must read complete instructions/literature along with all the possible adverse reactions/side effects for all the Medicines you take and that have been prescribed to you. Take any new Medicines after you have completely understood and accpet all the  possible adverse reactions/side effects.   Please note  You were cared for by a hospitalist during your hospital stay. If you have any questions about your discharge medications or the care you received while you were in the hospital after you are discharged, you can call the unit and asked to speak with the hospitalist on call if the hospitalist that took care of you is not available. Once you are discharged, your primary care physician will handle any further medical issues. Please note that NO REFILLS for any discharge medications will be authorized once you are discharged, as it is imperative that you return to your primary care physician (or establish a relationship with a primary care physician if you do not have one) for your aftercare needs so that they can reassess your need for medications and monitor your lab  values.     Today   A bit anxious but does not appear to be short of breath. Family at bedside. No acute events overnight.   VITAL SIGNS:  Blood pressure 130/69, pulse (!) 105, temperature 98.3 F (36.8 C), temperature source Oral, resp. rate (!) 21, height 5\' 3"  (1.6 m), weight 61.3 kg (135 lb 1.6 oz), SpO2 (!) 89 %.  I/O:    Intake/Output Summary (Last 24 hours) at 01/13/2017 1438 Last data filed at 01/13/2017 0800 Gross per 24 hour  Intake 240 ml  Output -  Net 240 ml    PHYSICAL EXAMINATION:  GENERAL:  71 y.o.-year-old patient lying in the bed with no acute distress.  EYES: Pupils equal, round, reactive to light and accommodation. No scleral icterus. Extraocular muscles intact.  HEENT: Head atraumatic, normocephalic. Oropharynx and nasopharynx clear.  NECK:  Supple, no jugular venous distention. No thyroid enlargement, no tenderness.  LUNGS: Good A/E b/l Prolonged Insp. & Exp. phase, no wheezing, rales, rhonchi. No use of accessory muscles of respiration.  CARDIOVASCULAR: S1, S2 normal. No murmurs, rubs, or gallops.  ABDOMEN: Soft, non-tender, non-distended. Bowel sounds present. No organomegaly or mass.  EXTREMITIES: No pedal edema, cyanosis, or clubbing.  NEUROLOGIC: Cranial nerves II through XII are intact. No focal motor or sensory defecits b/l.  PSYCHIATRIC: The patient is alert and oriented x 3.  SKIN: No obvious rash, lesion, or ulcer.   DATA REVIEW:   CBC Recent Labs  Lab 01/10/17 0541  WBC 19.1*  HGB 10.8*  HCT 33.5*  PLT 311    Chemistries  Recent Labs  Lab 01/10/17 0541  NA 137  K 4.2  CL 104  CO2 29  GLUCOSE 120*  BUN 23*  CREATININE 1.05*  CALCIUM 8.6*    Cardiac Enzymes No results for input(s): TROPONINI in the last 168 hours.  Microbiology Results  Results for orders placed or performed during the hospital encounter of 01/05/17  Blood culture (routine x 2)     Status: None   Collection Time: 01/05/17  9:30 AM  Result Value Ref  Range Status   Specimen Description BLOOD RIGHT PORTA CATH  Final   Special Requests   Final    BOTTLES DRAWN AEROBIC AND ANAEROBIC Blood Culture adequate volume   Culture NO GROWTH 5 DAYS  Final   Report Status 01/10/2017 FINAL  Final  Blood culture (routine x 2)     Status: None   Collection Time: 01/05/17 10:54 AM  Result Value Ref Range Status   Specimen Description BLOOD PAC  Final   Special Requests   Final    BOTTLES DRAWN AEROBIC AND ANAEROBIC Blood Culture  adequate volume   Culture NO GROWTH 5 DAYS  Final   Report Status 01/10/2017 FINAL  Final  MRSA PCR Screening     Status: None   Collection Time: 01/06/17 11:14 AM  Result Value Ref Range Status   MRSA by PCR NEGATIVE NEGATIVE Final    Comment:        The GeneXpert MRSA Assay (FDA approved for NASAL specimens only), is one component of a comprehensive MRSA colonization surveillance program. It is not intended to diagnose MRSA infection nor to guide or monitor treatment for MRSA infections.   Culture, expectorated sputum-assessment     Status: None   Collection Time: 01/08/17  2:26 PM  Result Value Ref Range Status   Specimen Description EXPECTORATED SPUTUM  Final   Special Requests NONE  Final   Sputum evaluation THIS SPECIMEN IS ACCEPTABLE FOR SPUTUM CULTURE  Final   Report Status 01/08/2017 FINAL  Final  Culture, respiratory (NON-Expectorated)     Status: None   Collection Time: 01/08/17  2:26 PM  Result Value Ref Range Status   Specimen Description EXPECTORATED SPUTUM  Final   Special Requests NONE Reflexed from [id number]  Final   Gram Stain   Final    ABUNDANT WBC PRESENT,BOTH PMN AND MONONUCLEAR RARE GRAM POSITIVE COCCI FEW YEAST FEW SQUAMOUS EPITHELIAL CELLS PRESENT Performed at [facility], [place] [address], [place], [place] [address]    Culture   Final    MODERATE CANDIDA ALBICANS FEW CANDIDA TROPICALIS    Report Status 01/11/2017 FINAL  Final    RADIOLOGY:  No results  found.    Management plans discussed with the patient, family and they are in agreement.  CODE STATUS:     Code Status Orders  (From admission, onward)        Start     Ordered   01/11/17 1238  Do not attempt resuscitation (DNR)  Continuous    Question Answer Comment  In the event of cardiac or respiratory ARREST Do not call a "code blue"   In the event of cardiac or respiratory ARREST Do not perform Intubation, CPR, defibrillation or ACLS   In the event of cardiac or respiratory ARREST Use medication by any route, position, wound care, and other measures to relive pain and suffering. May use oxygen, suction and manual treatment of airway obstruction as needed for comfort.      01/11/17 1237  Advance Directive Documentation     Most Recent Value  Type of Advance Directive  Healthcare Power of Attorney, Living will  Pre-existing out of facility DNR order (yellow form or pink MOST form)  No data  "MOST" Form in Place?  No data      TOTAL TIME TAKING CARE OF THIS PATIENT: 40 minutes.    Henreitta Leber M.D on 01/13/2017 at 2:38 PM  Between 7am to 6pm - Pager - [phone]  After 6pm go to [email or web address] - Proofreader  [facility]  Office  [phone]  CC: Primary care physician; Anthonette Legato, MD

## 2017-01-13 NOTE — Discharge Instructions (Signed)
[facility]

## 2017-02-16 ENCOUNTER — Other Ambulatory Visit: Payer: Self-pay | Admitting: Nurse Practitioner

## 2017-03-01 DEATH — deceased

## 2017-12-12 IMAGING — DX DG CHEST 1V PORT
1 series · 1 of 1 positions shown · non-contrast
Comparison: Radiograph 12/18/2016, chest CT 12/07/2016

CLINICAL DATA: Short of breath, congestion.  Lung carcinoma

EXAM:
PORTABLE CHEST 1 VIEW

[chest ap]
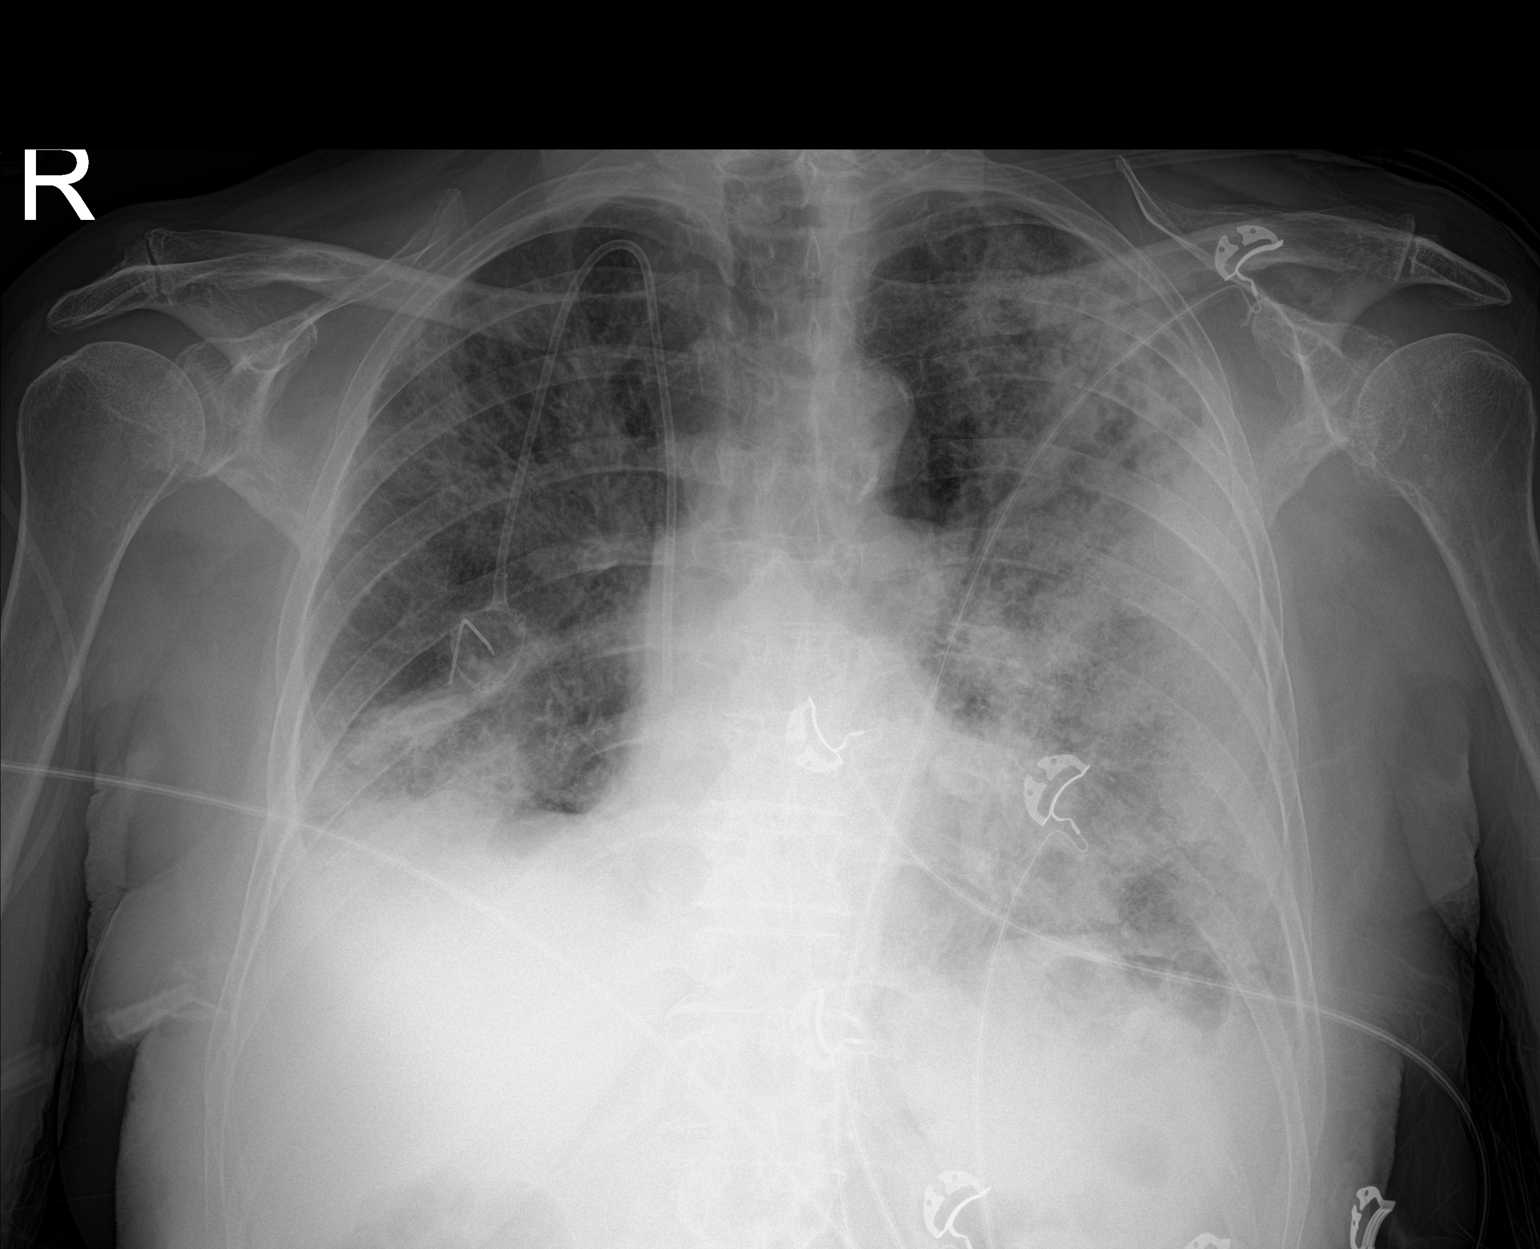

[1 of 1 positions shown; findings below may reference images not displayed]

FINDINGS: Power port in the RIGHT chest. Stable cardiac silhouette. There is
dense airspace disease throughout the LEFT lung and involving the
RIGHT lower lobe not changed from comparison exam. Some improvement
in airspace disease in the RIGHT upper lobe. No pneumothorax.
IMPRESSION: Persistent dense bilateral airspace disease.

## 2017-12-13 IMAGING — CR DG CHEST 2V
2 series · 2 of 2 positions shown · non-contrast
Comparison: 01/05/2017

CLINICAL DATA: Followup multifocal pneumonia.

EXAM:
CHEST  2 VIEW

[chest lat]
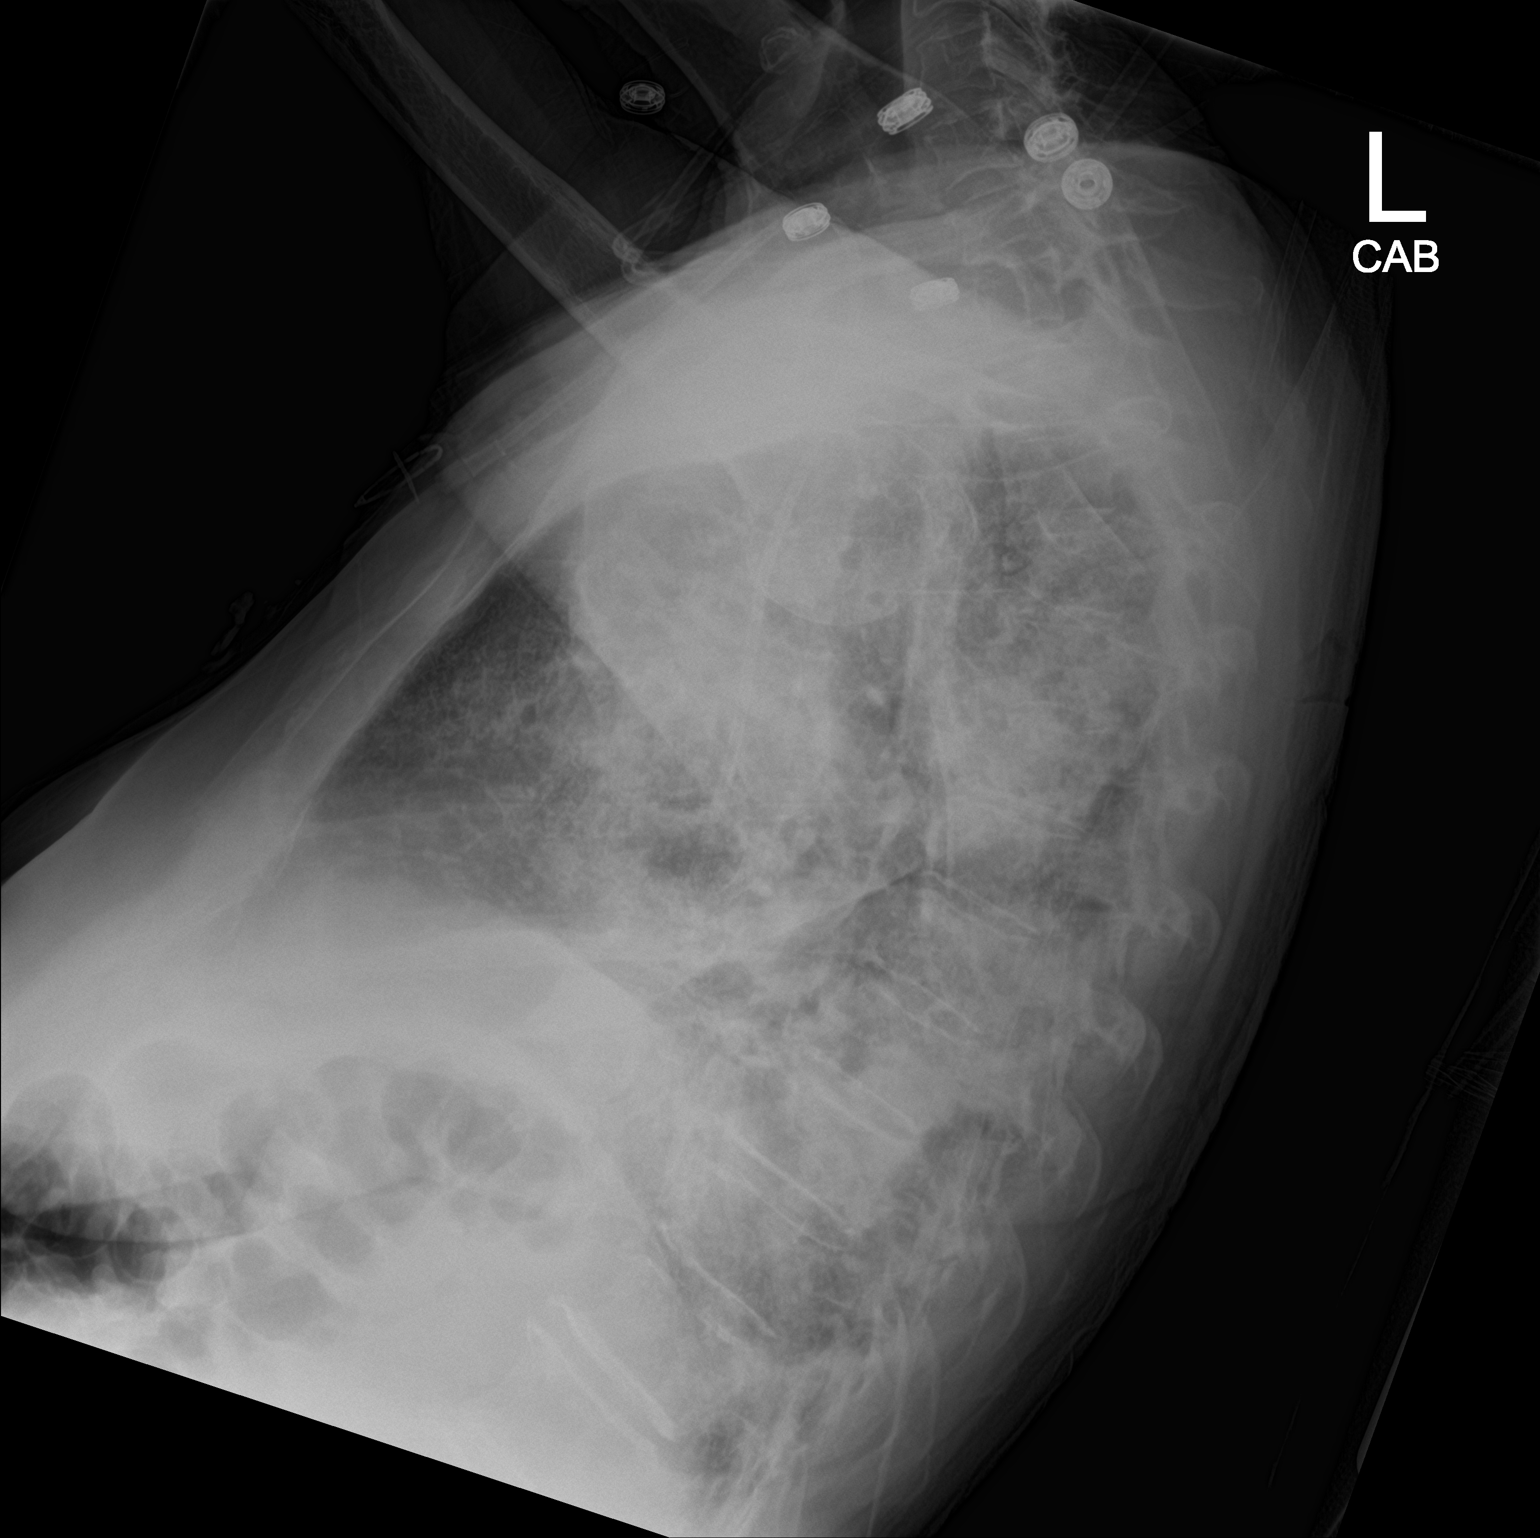

[chest ap]
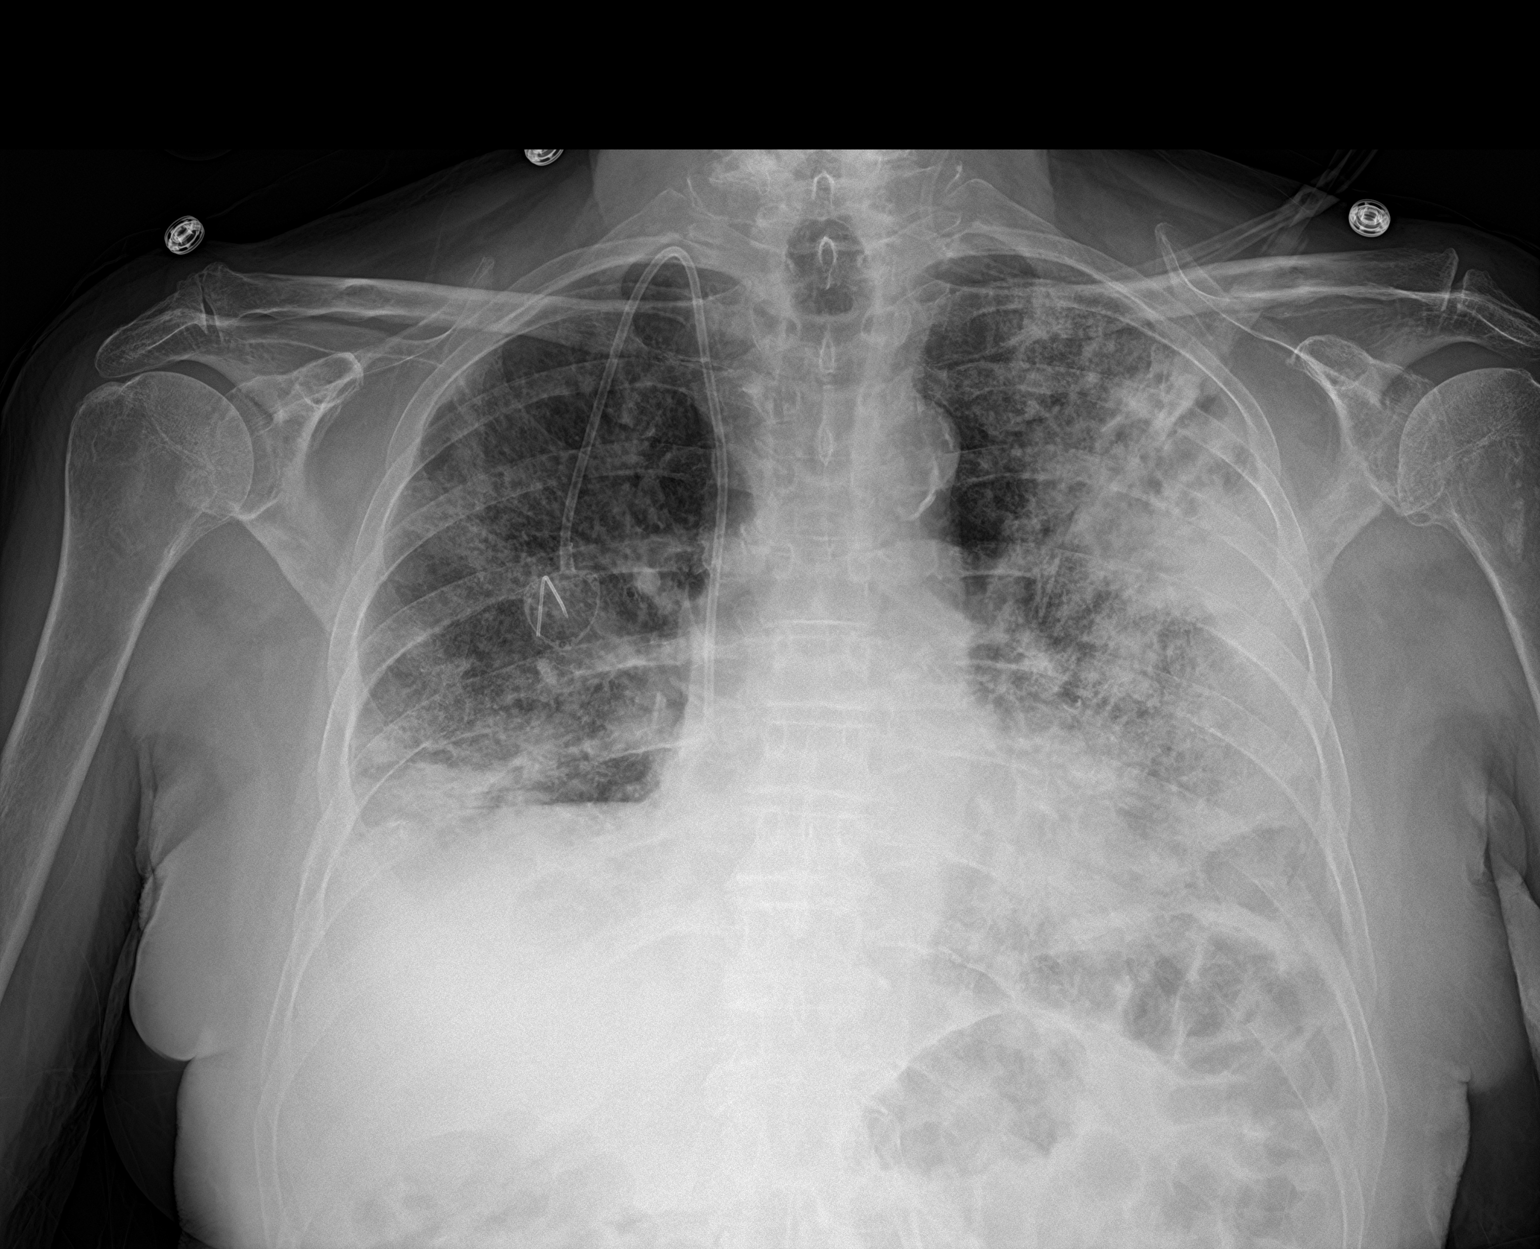

[2 of 2 positions shown; findings below may reference images not displayed]

FINDINGS: Interstitial opacities and airspace consolidation is without
significant change from prior study. This is most extensive on the
left. No new lung abnormalities.

No pneumothorax.
IMPRESSION: 1. No significant change in the bilateral, left greater than right,
interstitial and airspace opacities. No new abnormalities.
